# Patient Record
Sex: Female | Born: 1945
Health system: Southern US, Community
[De-identification: ages and names within clinical notes are randomized; demographics above are authoritative.]

## PROBLEM LIST (undated history)

## (undated) DIAGNOSIS — J189 Pneumonia, unspecified organism: Secondary | ICD-10-CM

## (undated) DIAGNOSIS — R269 Unspecified abnormalities of gait and mobility: Secondary | ICD-10-CM

## (undated) DIAGNOSIS — I10 Essential (primary) hypertension: Secondary | ICD-10-CM

## (undated) DIAGNOSIS — Z87898 Personal history of other specified conditions: Secondary | ICD-10-CM

## (undated) DIAGNOSIS — H3530 Unspecified macular degeneration: Secondary | ICD-10-CM

## (undated) DIAGNOSIS — G500 Trigeminal neuralgia: Secondary | ICD-10-CM

## (undated) DIAGNOSIS — M5412 Radiculopathy, cervical region: Secondary | ICD-10-CM

## (undated) DIAGNOSIS — M7918 Myalgia, other site: Secondary | ICD-10-CM

## (undated) DIAGNOSIS — E079 Disorder of thyroid, unspecified: Secondary | ICD-10-CM

## (undated) DIAGNOSIS — G519 Disorder of facial nerve, unspecified: Secondary | ICD-10-CM

## (undated) DIAGNOSIS — H9190 Unspecified hearing loss, unspecified ear: Secondary | ICD-10-CM

## (undated) DIAGNOSIS — M542 Cervicalgia: Secondary | ICD-10-CM

## (undated) DIAGNOSIS — E785 Hyperlipidemia, unspecified: Secondary | ICD-10-CM

## (undated) HISTORY — DX: Personal history of other specified conditions: Z87.898

## (undated) HISTORY — DX: Disorder of thyroid, unspecified: E07.9

## (undated) HISTORY — DX: Pneumonia, unspecified organism: J18.9

## (undated) HISTORY — DX: Hyperlipidemia, unspecified: E78.5

## (undated) HISTORY — PX: ABDOMINAL HYSTERECTOMY: SHX81

## (undated) HISTORY — PX: LUMBAR DISC SURGERY: SHX700

## (undated) HISTORY — DX: Trigeminal neuralgia: G50.0

## (undated) HISTORY — DX: Myalgia, other site: M79.18

## (undated) HISTORY — DX: Unspecified hearing loss, unspecified ear: H91.90

## (undated) HISTORY — DX: Radiculopathy, cervical region: M54.12

## (undated) HISTORY — DX: Unspecified abnormalities of gait and mobility: R26.9

## (undated) HISTORY — DX: Unspecified macular degeneration: H35.30

## (undated) HISTORY — DX: Essential (primary) hypertension: I10

## (undated) HISTORY — DX: Cervicalgia: M54.2

## (undated) HISTORY — PX: OTHER SURGICAL HISTORY: SHX169

## (undated) HISTORY — DX: Disorder of facial nerve, unspecified: G51.9

---
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
## 2001-09-14 ENCOUNTER — Ambulatory Visit (HOSPITAL_COMMUNITY): Admission: RE | Admit: 2001-09-14 | Discharge: 2001-09-14 | Payer: Self-pay | Admitting: Internal Medicine

## 2001-09-14 ENCOUNTER — Encounter: Payer: Self-pay | Admitting: Internal Medicine

## 2001-09-25 ENCOUNTER — Encounter: Payer: Self-pay | Admitting: Internal Medicine

## 2001-09-25 ENCOUNTER — Inpatient Hospital Stay (HOSPITAL_COMMUNITY): Admission: AD | Admit: 2001-09-25 | Discharge: 2001-09-27 | Payer: Self-pay | Admitting: Internal Medicine

## 2001-09-26 ENCOUNTER — Encounter: Payer: Self-pay | Admitting: Cardiology

## 2003-02-16 DIAGNOSIS — Z87898 Personal history of other specified conditions: Secondary | ICD-10-CM

## 2003-02-16 DIAGNOSIS — J189 Pneumonia, unspecified organism: Secondary | ICD-10-CM

## 2003-02-16 HISTORY — DX: Pneumonia, unspecified organism: J18.9

## 2003-02-16 HISTORY — DX: Personal history of other specified conditions: Z87.898

## 2003-02-16 HISTORY — PX: OTHER SURGICAL HISTORY: SHX169

## 2003-04-05 ENCOUNTER — Encounter: Admission: RE | Admit: 2003-04-05 | Discharge: 2003-04-05 | Payer: Self-pay | Admitting: Internal Medicine

## 2003-08-09 ENCOUNTER — Inpatient Hospital Stay (HOSPITAL_COMMUNITY)
Admission: RE | Admit: 2003-08-09 | Discharge: 2003-08-14 | Payer: Self-pay | Admitting: Physical Medicine & Rehabilitation

## 2003-08-14 ENCOUNTER — Encounter: Payer: Self-pay | Admitting: Cardiology

## 2003-08-14 ENCOUNTER — Inpatient Hospital Stay (HOSPITAL_COMMUNITY): Admission: EM | Admit: 2003-08-14 | Discharge: 2003-08-19 | Payer: Self-pay | Admitting: Cardiology

## 2003-08-19 ENCOUNTER — Inpatient Hospital Stay (HOSPITAL_COMMUNITY)
Admission: RE | Admit: 2003-08-19 | Discharge: 2003-09-03 | Payer: Self-pay | Admitting: Physical Medicine & Rehabilitation

## 2003-10-17 ENCOUNTER — Encounter
Admission: RE | Admit: 2003-10-17 | Discharge: 2004-01-15 | Payer: Self-pay | Admitting: Physical Medicine & Rehabilitation

## 2003-10-22 ENCOUNTER — Ambulatory Visit: Payer: Self-pay | Admitting: Physical Medicine & Rehabilitation

## 2004-01-08 ENCOUNTER — Ambulatory Visit: Payer: Self-pay | Admitting: Physical Medicine & Rehabilitation

## 2004-04-06 ENCOUNTER — Ambulatory Visit: Payer: Self-pay | Admitting: Physical Medicine & Rehabilitation

## 2004-04-06 ENCOUNTER — Encounter
Admission: RE | Admit: 2004-04-06 | Discharge: 2004-07-05 | Payer: Self-pay | Admitting: Physical Medicine & Rehabilitation

## 2004-06-03 ENCOUNTER — Ambulatory Visit: Payer: Self-pay | Admitting: Physical Medicine & Rehabilitation

## 2004-07-30 ENCOUNTER — Encounter
Admission: RE | Admit: 2004-07-30 | Discharge: 2004-10-28 | Payer: Self-pay | Admitting: Physical Medicine & Rehabilitation

## 2004-07-30 ENCOUNTER — Ambulatory Visit: Payer: Self-pay | Admitting: Physical Medicine & Rehabilitation

## 2004-09-14 ENCOUNTER — Ambulatory Visit: Payer: Self-pay | Admitting: Physical Medicine & Rehabilitation

## 2004-11-06 ENCOUNTER — Ambulatory Visit: Payer: Self-pay | Admitting: Internal Medicine

## 2004-12-07 ENCOUNTER — Ambulatory Visit: Payer: Self-pay | Admitting: Physical Medicine & Rehabilitation

## 2004-12-07 ENCOUNTER — Encounter
Admission: RE | Admit: 2004-12-07 | Discharge: 2005-03-07 | Payer: Self-pay | Admitting: Physical Medicine & Rehabilitation

## 2005-01-15 ENCOUNTER — Ambulatory Visit: Payer: Self-pay | Admitting: Physical Medicine & Rehabilitation

## 2005-01-25 ENCOUNTER — Encounter
Admission: RE | Admit: 2005-01-25 | Discharge: 2005-02-10 | Payer: Self-pay | Admitting: Physical Medicine & Rehabilitation

## 2005-02-16 ENCOUNTER — Ambulatory Visit: Payer: Self-pay | Admitting: Physical Medicine & Rehabilitation

## 2005-03-17 ENCOUNTER — Encounter
Admission: RE | Admit: 2005-03-17 | Discharge: 2005-06-15 | Payer: Self-pay | Admitting: Physical Medicine & Rehabilitation

## 2005-05-13 ENCOUNTER — Ambulatory Visit: Payer: Self-pay | Admitting: Internal Medicine

## 2005-06-04 ENCOUNTER — Ambulatory Visit: Payer: Self-pay | Admitting: Internal Medicine

## 2005-06-14 ENCOUNTER — Ambulatory Visit: Payer: Self-pay | Admitting: Internal Medicine

## 2005-07-08 ENCOUNTER — Encounter
Admission: RE | Admit: 2005-07-08 | Discharge: 2005-10-06 | Payer: Self-pay | Admitting: Physical Medicine & Rehabilitation

## 2005-07-08 ENCOUNTER — Ambulatory Visit: Payer: Self-pay | Admitting: Physical Medicine & Rehabilitation

## 2005-07-20 ENCOUNTER — Encounter: Admission: RE | Admit: 2005-07-20 | Discharge: 2005-07-20 | Payer: Self-pay | Admitting: Internal Medicine

## 2005-07-26 ENCOUNTER — Encounter: Admission: RE | Admit: 2005-07-26 | Discharge: 2005-07-26 | Payer: Self-pay | Admitting: Internal Medicine

## 2005-09-02 ENCOUNTER — Ambulatory Visit: Payer: Self-pay | Admitting: Physical Medicine & Rehabilitation

## 2005-09-21 ENCOUNTER — Ambulatory Visit: Payer: Self-pay | Admitting: Internal Medicine

## 2005-10-08 ENCOUNTER — Ambulatory Visit: Payer: Self-pay | Admitting: Physical Medicine & Rehabilitation

## 2005-10-08 ENCOUNTER — Encounter
Admission: RE | Admit: 2005-10-08 | Discharge: 2006-01-06 | Payer: Self-pay | Admitting: Physical Medicine & Rehabilitation

## 2005-11-04 ENCOUNTER — Ambulatory Visit: Payer: Self-pay | Admitting: Physical Medicine & Rehabilitation

## 2005-11-09 ENCOUNTER — Ambulatory Visit: Payer: Self-pay | Admitting: Cardiology

## 2005-11-15 ENCOUNTER — Encounter: Payer: Self-pay | Admitting: Internal Medicine

## 2005-11-15 ENCOUNTER — Ambulatory Visit: Payer: Self-pay

## 2005-11-26 ENCOUNTER — Ambulatory Visit: Payer: Self-pay | Admitting: Cardiology

## 2005-12-28 ENCOUNTER — Ambulatory Visit: Payer: Self-pay | Admitting: Physical Medicine & Rehabilitation

## 2006-01-25 ENCOUNTER — Encounter
Admission: RE | Admit: 2006-01-25 | Discharge: 2006-04-25 | Payer: Self-pay | Admitting: Physical Medicine & Rehabilitation

## 2006-02-22 ENCOUNTER — Ambulatory Visit: Payer: Self-pay | Admitting: Physical Medicine & Rehabilitation

## 2006-04-19 ENCOUNTER — Ambulatory Visit: Payer: Self-pay | Admitting: Physical Medicine & Rehabilitation

## 2006-05-19 ENCOUNTER — Encounter
Admission: RE | Admit: 2006-05-19 | Discharge: 2006-08-17 | Payer: Self-pay | Admitting: Physical Medicine & Rehabilitation

## 2006-06-16 ENCOUNTER — Ambulatory Visit: Payer: Self-pay | Admitting: Physical Medicine & Rehabilitation

## 2006-06-23 DIAGNOSIS — D7289 Other specified disorders of white blood cells: Secondary | ICD-10-CM

## 2006-06-23 DIAGNOSIS — R946 Abnormal results of thyroid function studies: Secondary | ICD-10-CM

## 2006-06-23 DIAGNOSIS — Z9079 Acquired absence of other genital organ(s): Secondary | ICD-10-CM | POA: Insufficient documentation

## 2006-06-23 DIAGNOSIS — I10 Essential (primary) hypertension: Secondary | ICD-10-CM | POA: Insufficient documentation

## 2006-07-20 ENCOUNTER — Encounter
Admission: RE | Admit: 2006-07-20 | Discharge: 2006-10-18 | Payer: Self-pay | Admitting: Physical Medicine & Rehabilitation

## 2006-08-13 ENCOUNTER — Encounter: Admission: RE | Admit: 2006-08-13 | Discharge: 2006-08-13 | Payer: Self-pay | Admitting: Neurosurgery

## 2006-08-16 ENCOUNTER — Ambulatory Visit: Payer: Self-pay | Admitting: Physical Medicine & Rehabilitation

## 2006-09-05 ENCOUNTER — Encounter
Admission: RE | Admit: 2006-09-05 | Discharge: 2006-12-04 | Payer: Self-pay | Admitting: Physical Medicine & Rehabilitation

## 2006-09-06 ENCOUNTER — Ambulatory Visit: Payer: Self-pay | Admitting: Physical Medicine & Rehabilitation

## 2006-10-06 ENCOUNTER — Encounter
Admission: RE | Admit: 2006-10-06 | Discharge: 2007-01-04 | Payer: Self-pay | Admitting: Physical Medicine & Rehabilitation

## 2006-10-18 ENCOUNTER — Ambulatory Visit: Payer: Self-pay | Admitting: Physical Medicine & Rehabilitation

## 2006-10-18 ENCOUNTER — Encounter
Admission: RE | Admit: 2006-10-18 | Discharge: 2006-10-18 | Payer: Self-pay | Admitting: Physical Medicine & Rehabilitation

## 2006-10-21 ENCOUNTER — Ambulatory Visit: Payer: Self-pay | Admitting: Physical Medicine & Rehabilitation

## 2007-01-16 ENCOUNTER — Encounter
Admission: RE | Admit: 2007-01-16 | Discharge: 2007-01-17 | Payer: Self-pay | Admitting: Physical Medicine & Rehabilitation

## 2007-01-16 ENCOUNTER — Ambulatory Visit: Payer: Self-pay | Admitting: Physical Medicine & Rehabilitation

## 2007-02-14 ENCOUNTER — Encounter
Admission: RE | Admit: 2007-02-14 | Discharge: 2007-04-27 | Payer: Self-pay | Admitting: Physical Medicine & Rehabilitation

## 2007-05-10 ENCOUNTER — Ambulatory Visit: Payer: Self-pay | Admitting: Physical Medicine & Rehabilitation

## 2007-05-10 ENCOUNTER — Encounter
Admission: RE | Admit: 2007-05-10 | Discharge: 2007-05-12 | Payer: Self-pay | Admitting: Physical Medicine & Rehabilitation

## 2007-08-03 ENCOUNTER — Encounter
Admission: RE | Admit: 2007-08-03 | Discharge: 2007-11-01 | Payer: Self-pay | Admitting: Physical Medicine & Rehabilitation

## 2007-08-07 ENCOUNTER — Ambulatory Visit: Payer: Self-pay | Admitting: Physical Medicine & Rehabilitation

## 2007-08-26 ENCOUNTER — Encounter: Admission: RE | Admit: 2007-08-26 | Discharge: 2007-08-26 | Payer: Self-pay | Admitting: Neurosurgery

## 2007-10-03 ENCOUNTER — Ambulatory Visit: Payer: Self-pay | Admitting: Physical Medicine & Rehabilitation

## 2007-11-27 ENCOUNTER — Encounter
Admission: RE | Admit: 2007-11-27 | Discharge: 2008-01-23 | Payer: Self-pay | Admitting: Physical Medicine & Rehabilitation

## 2007-11-28 ENCOUNTER — Ambulatory Visit: Payer: Self-pay | Admitting: Physical Medicine & Rehabilitation

## 2008-01-23 ENCOUNTER — Ambulatory Visit: Payer: Self-pay | Admitting: Physical Medicine & Rehabilitation

## 2008-04-18 ENCOUNTER — Encounter
Admission: RE | Admit: 2008-04-18 | Discharge: 2008-07-17 | Payer: Self-pay | Admitting: Physical Medicine & Rehabilitation

## 2008-04-19 ENCOUNTER — Ambulatory Visit: Payer: Self-pay | Admitting: Physical Medicine & Rehabilitation

## 2008-05-31 ENCOUNTER — Ambulatory Visit: Payer: Self-pay | Admitting: Physical Medicine & Rehabilitation

## 2008-06-28 ENCOUNTER — Ambulatory Visit: Payer: Self-pay | Admitting: Physical Medicine & Rehabilitation

## 2008-07-23 ENCOUNTER — Encounter
Admission: RE | Admit: 2008-07-23 | Discharge: 2008-10-17 | Payer: Self-pay | Admitting: Physical Medicine & Rehabilitation

## 2008-07-25 ENCOUNTER — Ambulatory Visit: Payer: Self-pay | Admitting: Physical Medicine & Rehabilitation

## 2008-08-27 ENCOUNTER — Ambulatory Visit: Payer: Self-pay | Admitting: Physical Medicine & Rehabilitation

## 2008-09-27 ENCOUNTER — Ambulatory Visit: Payer: Self-pay | Admitting: Physical Medicine & Rehabilitation

## 2008-10-25 ENCOUNTER — Ambulatory Visit: Payer: Self-pay | Admitting: Physical Medicine & Rehabilitation

## 2008-10-25 ENCOUNTER — Encounter
Admission: RE | Admit: 2008-10-25 | Discharge: 2009-01-03 | Payer: Self-pay | Admitting: Physical Medicine & Rehabilitation

## 2008-11-20 ENCOUNTER — Ambulatory Visit: Payer: Self-pay | Admitting: Physical Medicine & Rehabilitation

## 2008-12-18 ENCOUNTER — Ambulatory Visit: Payer: Self-pay | Admitting: Physical Medicine & Rehabilitation

## 2008-12-25 ENCOUNTER — Ambulatory Visit: Payer: Self-pay | Admitting: Internal Medicine

## 2008-12-25 DIAGNOSIS — R5381 Other malaise: Secondary | ICD-10-CM

## 2008-12-25 DIAGNOSIS — R5383 Other fatigue: Secondary | ICD-10-CM

## 2008-12-25 DIAGNOSIS — R091 Pleurisy: Secondary | ICD-10-CM | POA: Insufficient documentation

## 2008-12-26 ENCOUNTER — Encounter (INDEPENDENT_AMBULATORY_CARE_PROVIDER_SITE_OTHER): Payer: Self-pay | Admitting: *Deleted

## 2009-01-03 ENCOUNTER — Telehealth (INDEPENDENT_AMBULATORY_CARE_PROVIDER_SITE_OTHER): Payer: Self-pay | Admitting: *Deleted

## 2009-01-03 ENCOUNTER — Encounter
Admission: RE | Admit: 2009-01-03 | Discharge: 2009-02-12 | Payer: Self-pay | Admitting: Physical Medicine & Rehabilitation

## 2009-01-06 ENCOUNTER — Telehealth (INDEPENDENT_AMBULATORY_CARE_PROVIDER_SITE_OTHER): Payer: Self-pay | Admitting: *Deleted

## 2009-01-06 LAB — CONVERTED CEMR LAB
ALT: 16 units/L (ref 0–35)
Basophils Relative: 0 % (ref 0.0–3.0)
Creatinine, Ser: 1 mg/dL (ref 0.4–1.2)
Eosinophils Relative: 2.6 % (ref 0.0–5.0)
Free T4: 0.7 ng/dL (ref 0.6–1.6)
Lymphocytes Relative: 28.2 % (ref 12.0–46.0)
Neutrophils Relative %: 58.8 % (ref 43.0–77.0)
Potassium: 5.6 meq/L — ABNORMAL HIGH (ref 3.5–5.1)
RBC: 4.26 M/uL (ref 3.87–5.11)
WBC: 8.6 10*3/uL (ref 4.5–10.5)

## 2009-01-15 ENCOUNTER — Ambulatory Visit: Payer: Self-pay | Admitting: Physical Medicine & Rehabilitation

## 2009-02-12 ENCOUNTER — Ambulatory Visit: Payer: Self-pay | Admitting: Physical Medicine & Rehabilitation

## 2009-02-17 ENCOUNTER — Ambulatory Visit: Payer: Self-pay | Admitting: Internal Medicine

## 2009-02-24 ENCOUNTER — Encounter (INDEPENDENT_AMBULATORY_CARE_PROVIDER_SITE_OTHER): Payer: Self-pay | Admitting: *Deleted

## 2009-02-28 ENCOUNTER — Telehealth (INDEPENDENT_AMBULATORY_CARE_PROVIDER_SITE_OTHER): Payer: Self-pay | Admitting: *Deleted

## 2009-03-13 ENCOUNTER — Encounter
Admission: RE | Admit: 2009-03-13 | Discharge: 2009-05-22 | Payer: Self-pay | Admitting: Physical Medicine & Rehabilitation

## 2009-03-20 ENCOUNTER — Ambulatory Visit: Payer: Self-pay | Admitting: Physical Medicine & Rehabilitation

## 2009-05-22 ENCOUNTER — Ambulatory Visit: Payer: Self-pay | Admitting: Physical Medicine & Rehabilitation

## 2009-06-26 ENCOUNTER — Encounter
Admission: RE | Admit: 2009-06-26 | Discharge: 2009-06-30 | Payer: Self-pay | Admitting: Physical Medicine & Rehabilitation

## 2009-06-30 ENCOUNTER — Ambulatory Visit: Payer: Self-pay | Admitting: Physical Medicine & Rehabilitation

## 2009-07-30 ENCOUNTER — Encounter: Admission: RE | Admit: 2009-07-30 | Discharge: 2009-07-30 | Payer: Self-pay | Admitting: Neurosurgery

## 2009-08-21 ENCOUNTER — Ambulatory Visit: Payer: Self-pay | Admitting: Physical Medicine & Rehabilitation

## 2009-08-21 ENCOUNTER — Encounter
Admission: RE | Admit: 2009-08-21 | Discharge: 2009-11-19 | Payer: Self-pay | Admitting: Physical Medicine & Rehabilitation

## 2009-10-01 ENCOUNTER — Ambulatory Visit: Payer: Self-pay | Admitting: Physical Medicine & Rehabilitation

## 2009-11-13 ENCOUNTER — Ambulatory Visit: Payer: Self-pay | Admitting: Physical Medicine & Rehabilitation

## 2009-12-05 ENCOUNTER — Encounter
Admission: RE | Admit: 2009-12-05 | Discharge: 2010-02-12 | Payer: Self-pay | Source: Home / Self Care | Attending: Physical Medicine & Rehabilitation | Admitting: Physical Medicine & Rehabilitation

## 2009-12-11 ENCOUNTER — Ambulatory Visit: Payer: Self-pay | Admitting: Physical Medicine & Rehabilitation

## 2010-01-12 ENCOUNTER — Ambulatory Visit: Payer: Self-pay | Admitting: Physical Medicine & Rehabilitation

## 2010-02-17 ENCOUNTER — Encounter
Admission: RE | Admit: 2010-02-17 | Discharge: 2010-02-20 | Payer: Self-pay | Source: Home / Self Care | Attending: Physical Medicine & Rehabilitation | Admitting: Physical Medicine & Rehabilitation

## 2010-02-20 ENCOUNTER — Ambulatory Visit
Admission: RE | Admit: 2010-02-20 | Discharge: 2010-02-20 | Payer: Self-pay | Source: Home / Self Care | Attending: Physical Medicine & Rehabilitation | Admitting: Physical Medicine & Rehabilitation

## 2010-03-19 ENCOUNTER — Ambulatory Visit: Payer: Medicare Other

## 2010-03-19 ENCOUNTER — Ambulatory Visit: Admit: 2010-03-19 | Payer: Self-pay | Admitting: Physical Medicine & Rehabilitation

## 2010-03-19 ENCOUNTER — Ambulatory Visit: Payer: Medicare Other | Attending: Physical Medicine & Rehabilitation

## 2010-03-19 DIAGNOSIS — R269 Unspecified abnormalities of gait and mobility: Secondary | ICD-10-CM

## 2010-03-19 DIAGNOSIS — G510 Bell's palsy: Secondary | ICD-10-CM

## 2010-03-19 DIAGNOSIS — G500 Trigeminal neuralgia: Secondary | ICD-10-CM

## 2010-03-19 DIAGNOSIS — Z79899 Other long term (current) drug therapy: Secondary | ICD-10-CM | POA: Insufficient documentation

## 2010-03-19 NOTE — Progress Notes (Signed)
Summary: Lab Results  Phone Note Call from Patient Call back at Home Phone [phone]   Caller: Patient Summary of Call: Message left on VM: Please call about labs  Free T3 & Free T4 are normal;TSH has improved but still  is minimally elevated. Please recheck same labs in 3 months. TSH goal = 1-3.(794.5) [name]  February 28, 2009 12:13 PM

## 2010-03-19 NOTE — Letter (Signed)
Summary: Results Follow up Letter  [facility]  [address] [place], [place] [address]   Phone: [phone]  Fax: [phone]    02/24/2009 MRN: [id number]   [name] [address] [place], [place]  [address]    Dear Ms. Holladay,  The following are the results of your recent test(s):  Test         Result    Pap Smear:        Normal _____  Not Normal _____ Comments: ______________________________________________________ Cholesterol: LDL(Bad cholesterol):         Your goal is less than:         HDL (Good cholesterol):       Your goal is more than: Comments:  ______________________________________________________ Mammogram:        Normal _____  Not Normal _____ Comments:  ___________________________________________________________________ Hemoccult:        Normal _____  Not normal _______ Comments:    _____________________________________________________________________ Other Tests:Please see attached labs.    We routinely do not discuss normal results over the telephone.  If you desire a copy of the results, or you have any questions about this information we can discuss them at your next office visit.   Sincerely,    Felecia,CMA

## 2010-04-16 ENCOUNTER — Encounter: Payer: Medicare Other | Attending: Physical Medicine & Rehabilitation

## 2010-04-16 ENCOUNTER — Ambulatory Visit: Payer: Medicare Other

## 2010-04-16 DIAGNOSIS — T889XXS Complication of surgical and medical care, unspecified, sequela: Secondary | ICD-10-CM | POA: Insufficient documentation

## 2010-04-16 DIAGNOSIS — R269 Unspecified abnormalities of gait and mobility: Secondary | ICD-10-CM

## 2010-04-16 DIAGNOSIS — Y849 Medical procedure, unspecified as the cause of abnormal reaction of the patient, or of later complication, without mention of misadventure at the time of the procedure: Secondary | ICD-10-CM | POA: Insufficient documentation

## 2010-04-16 DIAGNOSIS — S049XXS Injury of unspecified cranial nerve, sequela: Secondary | ICD-10-CM | POA: Insufficient documentation

## 2010-04-16 DIAGNOSIS — R51 Headache: Secondary | ICD-10-CM | POA: Insufficient documentation

## 2010-04-16 DIAGNOSIS — G500 Trigeminal neuralgia: Secondary | ICD-10-CM | POA: Insufficient documentation

## 2010-04-16 DIAGNOSIS — G510 Bell's palsy: Secondary | ICD-10-CM

## 2010-04-27 ENCOUNTER — Ambulatory Visit (INDEPENDENT_AMBULATORY_CARE_PROVIDER_SITE_OTHER): Payer: Self-pay | Admitting: Internal Medicine

## 2010-04-27 ENCOUNTER — Encounter: Payer: Self-pay | Admitting: Internal Medicine

## 2010-04-27 DIAGNOSIS — S139XXA Sprain of joints and ligaments of unspecified parts of neck, initial encounter: Secondary | ICD-10-CM | POA: Insufficient documentation

## 2010-04-27 DIAGNOSIS — R519 Headache, unspecified: Secondary | ICD-10-CM | POA: Insufficient documentation

## 2010-04-27 DIAGNOSIS — D332 Benign neoplasm of brain, unspecified: Secondary | ICD-10-CM

## 2010-04-27 DIAGNOSIS — R51 Headache: Secondary | ICD-10-CM | POA: Insufficient documentation
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
## 2010-05-20 ENCOUNTER — Encounter: Payer: Medicare Other | Attending: Physical Medicine & Rehabilitation | Admitting: Physical Medicine & Rehabilitation

## 2010-05-20 DIAGNOSIS — G500 Trigeminal neuralgia: Secondary | ICD-10-CM

## 2010-05-20 DIAGNOSIS — IMO0001 Reserved for inherently not codable concepts without codable children: Secondary | ICD-10-CM

## 2010-05-20 DIAGNOSIS — R269 Unspecified abnormalities of gait and mobility: Secondary | ICD-10-CM | POA: Insufficient documentation

## 2010-05-20 DIAGNOSIS — G510 Bell's palsy: Secondary | ICD-10-CM
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
## 2010-06-17 ENCOUNTER — Ambulatory Visit: Payer: Medicare Other | Admitting: Physical Medicine & Rehabilitation

## 2010-06-17 ENCOUNTER — Encounter: Payer: Medicare Other | Attending: Physical Medicine & Rehabilitation | Admitting: Physical Medicine & Rehabilitation

## 2010-06-17 DIAGNOSIS — R42 Dizziness and giddiness: Secondary | ICD-10-CM | POA: Insufficient documentation

## 2010-06-17 DIAGNOSIS — G8929 Other chronic pain: Secondary | ICD-10-CM | POA: Insufficient documentation

## 2010-06-17 DIAGNOSIS — T889XXS Complication of surgical and medical care, unspecified, sequela: Secondary | ICD-10-CM | POA: Insufficient documentation

## 2010-06-17 DIAGNOSIS — Y838 Other surgical procedures as the cause of abnormal reaction of the patient, or of later complication, without mention of misadventure at the time of the procedure: Secondary | ICD-10-CM | POA: Insufficient documentation

## 2010-06-17 DIAGNOSIS — M542 Cervicalgia: Secondary | ICD-10-CM | POA: Insufficient documentation

## 2010-06-17 DIAGNOSIS — R269 Unspecified abnormalities of gait and mobility: Secondary | ICD-10-CM

## 2010-06-17 DIAGNOSIS — G500 Trigeminal neuralgia: Secondary | ICD-10-CM | POA: Insufficient documentation

## 2010-06-17 DIAGNOSIS — IMO0001 Reserved for inherently not codable concepts without codable children: Secondary | ICD-10-CM

## 2010-06-17 DIAGNOSIS — M25519 Pain in unspecified shoulder: Secondary | ICD-10-CM | POA: Insufficient documentation

## 2010-06-17 DIAGNOSIS — R5381 Other malaise: Secondary | ICD-10-CM | POA: Insufficient documentation

## 2010-06-17 DIAGNOSIS — S049XXS Injury of unspecified cranial nerve, sequela: Secondary | ICD-10-CM | POA: Insufficient documentation
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
## 2010-06-30 NOTE — Procedures (Signed)
[name], [name]             ACCOUNT NO.:  [id number]   MEDICAL RECORD NO.:  [id number]          PATIENT TYPE:  REC   LOCATION:  [facility]                          FACILITY:  [facility]   PHYSICIAN:  Erick Colace, M.D.DATE OF BIRTH:  [birth year]   DATE OF PROCEDURE:  10/28/2006  DATE OF DISCHARGE:                               OPERATIVE REPORT   The patient has a history of trigeminal neuralgia.  She has had  acupuncture treatment x4 and the last treatment resulted in one week  reduction of pain which the patient feels is substantial.  The pain has  gone down from 7 out of 10 to 5 out of 10.   Treatment today consisted of placement of 15 mm acupuncture needles at  SI19, ST4, RN24, DU26, stim with between SI19 and between ST4 and RN24  at 150 hertz and at [place] and SI19 at 4 hertz.  The patient tolerated the  procedure well.      Erick Colace, M.D.  Electronically Signed     AEK/MEDQ  D:  10/28/2006 16:33:53  T:  10/28/2006 23:19:48  Job:  [id number]

## 2010-06-30 NOTE — Assessment & Plan Note (Signed)
[name] is back regarding her facial pain.  She feels that her speech is  worsened over the last few months, and she has had more sensory loss.  She has followed up with her ophthalmologist, who does not want to take  her gold weight out of her eyelid, but it seems to be causing allergic  response, where she has had some infections as a result.  The patient  denies any other neurological changes.  She rates her pain a 5/10.  The  methadone seems to help most days, but she still has mid day relapse  where her pain seems to increase around 1 or 2 o'clock p.m.  She sleeps  fairly well.  Pain is described as stabbing, burning, constant, and  tingling.  She liked the Voltaren gel.  We gave her samples.   REVIEW OF SYSTEMS:  Notable for numbness, trouble walking, dizziness,  and limb swelling.  The patient denies any other issues other than those  mentioned above.  Full review is in the written health and history  section.   Oswestry score is 42% today.   SOCIAL HISTORY:  The patient is married.  Husband remains supportive.   PHYSICAL EXAMINATION:  VITAL SIGNS:  Blood pressure is 152/86, pulse is  81, respiratory rate 18, and she is sating 95% on room air.  She continues to have stable lid closure.  She does seem to have a bit  more of a problem with speech and has this more dysarthria today.  It is  hard to discriminate whether she has further sensory loss.  She may be a  bit drier in the oral mucosa as well.  Gait is stable in general, but  still has some problems with more higher level balance.  Cognition is  intact.  HEART:  Regular.  CHEST:  Clear.  ABDOMEN:  Soft, nontender.   ASSESSMENT:  1. Left vestibular schwannoma with perioperative facial nerve injury.  2. Left trigeminal neuralgia.   PLAN:  1. We will increase methadone to 10 mg t.i.d.  2. Begin Savella trial, titrating up to 50 mg b.i.d. over 1 month.  3. The patient was given prescription for Voltaren gel.  4. I will  see her back in about 6 weeks' time.  Still consider      Nucynta.       Ranelle Oyster, M.D.  Electronically Signed     ZTS/MedQ  D:  04/19/2008 14:13:28  T:  04/20/2008 05:51:10  Job #:  [id number]   cc:   Titus Dubin. Alwyn Ren, MD,FACP,FCCP  [phone] [place]  [place] [address]

## 2010-06-30 NOTE — Assessment & Plan Note (Signed)
[name] is back regarding her facial pain.  She did not start the  [facility], as after she left our office, she had GI infection and she was  afraid to start without talking to me again.  We did bump up her  methadone a bit, which she uses on occasion and it seems to help.  She  does complain of some generalized fatigue.  Her pain ranges from 6-8/10.  Otherwise, nothing really has changed.  Pain is described as sharp,  burning, stabbing, constant, tingling, and aching.  Pain interferes with  general activity, relations with others, enjoyment of life on a moderate-  to-severe level.  Sleep is good.   REVIEW OF SYSTEMS:  Notable for trouble walking, tingling, dizziness,  anxiety, constipation, limb swelling.  Other pertinent positives are  above.  Full review is in the written health and history section of the  chart.   SOCIAL HISTORY:  The patient is married, lives with her husband.   PHYSICAL EXAMINATION:  VITAL SIGNS:  Blood pressure is 131/67, pulse is  65, respiratory rate 18.  She is sating 99% on room air.  GENERAL:  The patient is pleasant, alert and oriented x3.  NEUROLOGIC:  She has familiar deficits in the left facial nerve  distribution.  Face is somewhat decreased still to fine touch and  sensitive to touch in general.  She still has a bit of balance deficit.  Cognitively, she is well within normal limits.  HEART:  Regular.  CHEST:  Clear.  ABDOMEN:  Soft, nontender.   ASSESSMENT:  1. Left vestibular Schwannoma with perioperative facial nerve injury.  2. Left trigeminal neuralgia.   PLAN:  1. Continue with methadone 10-15 mg b.i.d.  2. We will retry Savella as previously described.  She is to call with      any problems.  3. Again, we discussed Nucynta, which we can look at as well.  She      will call if she is interested in this depending on how she does      with the [facility].  4. I will see her back in 3 months with Nurse Clinic followup in 1      month's  time.      Ranelle Oyster, M.D.  Electronically Signed     ZTS/MedQ  D:  05/31/2008 12:11:10  T:  06/01/2008 03:16:14  Job #:  [id number]   cc:   Titus Dubin. Alwyn Ren, MD,FACP,FCCP  [phone] [place]  [place] [address]

## 2010-06-30 NOTE — Assessment & Plan Note (Signed)
HISTORY:  [name] is back regarding her facial pain.  She reports 4-5/10  pain.  She is generally happy with the methadone 10 mg b.i.d.  She often  only takes a half in the morning.  She describes her pain as sharp,  burning, constant, and tingling.  Her biggest concern is increasing  facial numbness over the left face over the last month or so.  She has  followup MRI with Dr. Wynetta Emery next month.  She also states that she has had  a more difficult time opening the left eye lid.  She wonders the way to  come out of the eye.  She then has never had her new pain cream still  that she lost the prescription.  She uses hydrocodone for breakthrough  pain.   REVIEW OF SYSTEMS:  Notable for the above as well as trouble walking and  with dizziness still.  She has occasional depression.  She has loss of  taste, occasional limb swelling, and weakness.  Full review is in the  written health and history section in the chart.   SOCIAL HISTORY:  The patient lives with her husband and no new findings  are noted today.   PHYSICAL EXAMINATION:  VITAL SIGNS:  Blood pressure is 117/53, pulse 65,  respiratory rate 16, and she is sating 99% on room air.  GENERAL:  The patient is pleasant, alert, and oriented x3.  She has no  obvious differences in her left facial asymmetries today.  She had  intact lid closure.  She continues to have decreased discrimination of  light touch in the left side, but remained hypersensitive in the third  division of cranial nerve V area.  Speech is intelligible.  Balance is  fair with normal gait, but she struggles with heel and toe walking.  Cognition is intact.  HEART:  Regular.  CHEST:  Clear.  ABDOMEN:  Soft and nontender.   ASSESSMENT:  1. Left-sided vestibular schwannoma with perioperative facial nerve      injury.  2. Left-sided trigeminal neuralgia.   PLAN:  1. Continue methadone at 5-10 mg b.i.d.  2. We wrote analgesic cream of 2% ketamine, 20% ketoprofen, and 4%    Elavil to be used b.i.d. to t.i.d.  3. Continue Vicodin and Tofranil.  4. I will see her back in about 4 months with 2 months followup in the      nurse clinic.  The patient will see Dr. Wynetta Emery next month to follow      up her MRI and discuss her facial numbness.      Ranelle Oyster, M.D.  Electronically Signed     ZTS/MedQ  D:  08/07/2007 11:28:52  T:  08/08/2007 01:35:54  Job #:  [id number]   cc:   Titus Dubin. Alwyn Ren, MD,FACP,FCCP  [phone] [place]  [place] [address]   Donalee Citrin, M.D.  Fax: [phone]   Jesse Sans. Wall, MD, FACC  [address]  [address]  [place]  [place] [address]

## 2010-06-30 NOTE — Group Therapy Note (Signed)
Consult requested by Dr. Ivory Broad, in order to evaluate acupuncture  as a potential modality for treatment of her trigeminal neuralgia.  The  patient is a 65 year old female with history of left vestibular  schwannoma, resected at [facility] August 09, 2003.  She also had a cranial  nerve 7 palsy and resultant trigeminal neuralgia.  She was inpatient at  [facility] and her inpatient at [facility] on two occasions, one August 09, 2003 to June 29, but was discharged with cardiac arrhythmias with SVT  but was readmitted July 7 to September 03, 2003 to complete her  rehabilitation stay.  She has been managed with hydrocodone, Duragesic  patch.  She has gone on social security disability for problems related  to her schwannoma.  She has been tried on Neurontin, Lyrica, Topamax,  Gabitril and maintained on Cymbalta, as well as Ultram and hydrocodone.  In addition, she was tried on Namenda in combination of Cymbalta and  Lyrica.  Trigeminal nerve block was recommended.  Her ophthalmologist  recommended against it, stating it may interfere with her vision.  She  has also tried topical cream with compounded Neurontin and baclofen.  Her pain is mainly in the maxillary region as well as mandibular region  from midline to the angle of the mandible and zygoma.  She denies any  neck pain or upper extremity pain.   CURRENT MEDICATIONS:  Include Vicodin, Demerol, and Tofranil as well as  Duragesic patch.   She is awaiting further follow up with Dr. Donalee Citrin.   PHYSICAL EXAMINATION:  CRANIAL NERVES:  Cranial nerves 2,3,4, 6 intact,  5 has decreased sensation in V2, V3 and some paresthesias in V1.  Cranial nerve 7 is out, i.e. 0, on the left side, and intact on the  right side.  NECK:  He neck range of motion is full.  Neck palpation is nontender.  EXTREMITIES:  Upper extremity strength is full.  Gait is normal.   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   PLAN:  The patient would be a good candidate for acupuncture.  Will do a  trial treatment today which consists of STU2, DL2, DU26 and SI19, 20  minutes auricular point thalamus placed as well.  If the patient  tolerates the procedure well, will schedule some additional treatments.      Erick Colace, M.D.  Electronically Signed     AEK/MedQ  D:  09/06/2006 16:39:19  T:  09/07/2006 09:53:48  Job #:  [id number]   cc:   Donalee Citrin, M.D.  Fax: [phone]

## 2010-06-30 NOTE — Procedures (Signed)
[name], [name]             ACCOUNT NO.:  [id number]   MEDICAL RECORD NO.:  [id number]          PATIENT TYPE:  REC   LOCATION:  [facility]                          FACILITY:  [facility]   PHYSICIAN:  Erick Colace, M.D.DATE OF BIRTH:  23-Jul-1945   DATE OF PROCEDURE:  10/21/2006  DATE OF DISCHARGE:                               OPERATIVE REPORT   ACUPUNCTURE TREATMENT:  Treatment today consists of SI9 to RN24, ST4,  DU26, e-stem with RN24 and ST4 at 2 Hz and with DU26 at 150 Hz.  In  addition, .ST2 utilized as well.  The patient tolerated the procedure  well.   TREATMENT TIME:  30 minutes.   Return in 2 weeks.      Erick Colace, M.D.  Electronically Signed     AEK/MEDQ  D:  10/21/2006 13:20:32  T:  10/21/2006 15:11:28  Job:  [id number]

## 2010-06-30 NOTE — Assessment & Plan Note (Signed)
The patient is back regarding her facial pain.  She has received  Acupuncture per Dr. Fritzi Mandes and is still having pain in the left face.  She does not know that it has helped thus far.  Her pain averages 7 out  of 10.  She describes it as sharp, burning, stabbing, tingling.  Sleep  is fair.  She is concerned that her face may be drooping more on the  left over the last few weeks than it had previously.  Dr. Wynetta Emery  apparently found a 9 mm area in the operative site that was concerning  for a potential new tumor versus scar tissue.  The plan is to check  serial scans.   REVIEW OF SYSTEMS:  Notable for numbness, tingling, trouble walking,  dizziness still when up.  She complains of loss of taste.  Mood has been  fair.   SOCIAL HISTORY:  Without change.   PHYSICAL EXAMINATION:  Blood pressure 120/63, pulse 64, respiratory rate  18, she is satting 98% on room air.  The patient's gait is stable  although occasionally ataxic and still wide based.  The patient remained  sensitive in the left third division of the trigeminal nerve.  Heart is  regular, chest is clear.  Abdomen soft, nontender.   ASSESSMENT:  1. 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  2. Left sided trigeminal neuralgia.   PLAN:  1. Begin Duragesic patches for now.  Consider other options including      oxycodone, opana, or methadone.  2. Await further results with Acupuncture.  3. Continue Vicodin for breakthrough pain with Demerol for more severe      pain.  4. Tofranil PM for neuropathic pain.  5. Continue analgesic cream as well.  6. I will see her back in one to two month's time.  We discussed other      homeopathic alternatives, treatments that she can explore as well.      Ranelle Oyster, M.D.  Electronically Signed     ZTS/MedQ  D:  10/18/2006 12:51:37  T:  10/18/2006 14:01:36  Job #:  [id number]

## 2010-06-30 NOTE — Assessment & Plan Note (Signed)
[name] is back regarding her facial pain.  Pain is a little bit worse  today, although it has been generally in the realm of 4-6/10.  She is  using methadone 10 mg b.i.d.  She asks if on occasion, she can take  more.  She saw Dr. Wynetta Emery, who found no significant change in any of the  perioperative sites.  We recommended 2-year followup with him.  She has  not filled the analgesic cream as of yet.  She uses Vicodin for  breakthrough pain.  She is no longer using Tofranil.  The patient is  following up with surgeon at [facility] regarding the lid weight as it  causes some irritation and she would like to have it taken out.   REVIEW OF SYSTEMS:  Notable for numbness, occasional dizziness, anxiety,  urinary retention, constipation, and limb swelling.  Other pertinent  positives are above and full review is in the written health and history  section.   SOCIAL HISTORY:  The patient is married and husband is with her today,  whom I met for the first time.   PHYSICAL EXAMINATION:  VITAL SIGNS:  Blood pressure is 148/58, pulse is  60, respiratory rate 18, and she is sating 99% on room air.  GENERAL:  The patient is pleasant, alert, and oriented x3.  I saw no  focal deficits in her cranial nerve exam today.  She still has lid  closure with effort.  There is decreased light touch discrimination with  hypersensitivity in the third division of five.  Speech is intelligible.  She continues to have decreased gait and is unable to walk with heal-to-  toe due to loss balance, particularly on the right leg.  Cognition is  normal.  HEART:  Regular.  CHEST:  Clear.  ABDOMEN:  Soft and nontender.   ASSESSMENT:  1. Left-sided vestibular schwannoma with perioperative fascial nerve      injury.  2. Left trigeminal neuralgia.   PLAN:  1. Continue methadone 10 mg b.i.d.  2. Encouraged her to have her analgesic cream filled with 2% ketamine,      20% ketoprofen, and 4% Elavil to be used b.i.d. to t.i.d.  3.  Continue Vicodin for breakthrough pain.  4. Discussed the options including Nucynta and Savella for pain.  5. We will see her back in 2 months with nursing clinic and 4 months      with me.      [name], M.D.  Electronically Signed     ZTS/MedQ  D:  11/28/2007 11:01:53  T:  11/29/2007 00:44:09  Job #:  [id number]   cc:   [name]. Traci Ren, MD,FACP,FCCP  [phone] [place]  [place] [address]   [name], M.D.  Fax: [phone]   Traci Sans. Wall, MD, FACC  [address]  [address]  [place]  [place] [address]

## 2010-06-30 NOTE — Procedures (Signed)
NAME:  [name], [name]             ACCOUNT NO.:  10/07/2006   MEDICAL RECORD NO.:  [id number]           PATIENT TYPE:   LOCATION:                                 FACILITY:   PHYSICIAN:  Erick Colace, M.D.DATE OF BIRTH:  September 13, 1945   DATE OF PROCEDURE:  DATE OF DISCHARGE:                               OPERATIVE REPORT   Treatment today is acupuncture treatment with needles placed at BL2,  ST2, on the left at STL2, ST2, SI as equally 19, ST4, and RN24.  E stim  between SI 19 and ST4, 4 hertz times 30 minutes.  Auricular adhesive  tack placed at left shen-men.  She tolerated procedure well.  Return in  one week.      Erick Colace, M.D.  Electronically Signed     AEK/MEDQ  D:  10/07/2006 13:36:14  T:  10/08/2006 16:40:26  Job:  [id number]

## 2010-06-30 NOTE — Procedures (Signed)
NAMEMARKAYLA, [name]             ACCOUNT NO.:  [id number]   MEDICAL RECORD NO.:  [id number]          PATIENT TYPE:  REC   LOCATION:  [facility]                          FACILITY:  [facility]   PHYSICIAN:  Erick Colace, M.D.DATE OF BIRTH:  09/01/1945   DATE OF PROCEDURE:  11/03/2006  DATE OF DISCHARGE:                               OPERATIVE REPORT   This is for trigeminal neuralgia, Acupuncture treatments times five thus  far, reduction in pain since I initiated treatment.   Treatment today consisted of 15 mm Acupuncture needles placed at SI 19,  ST 4, RN 24, DU 26 with stem between ST 4 and RN 24, 150 Hz, the 26 SI  19 at 4 Hz, ST 4, SI 9 at 4 Hz, and also DU 20, DU 24.5 at 4 Hz.   The patient tolerated the procedure well.  We will see her back in  approximately two weeks.      Erick Colace, M.D.  Electronically Signed     AEK/MEDQ  D:  11/03/2006 10:57:40  T:  11/03/2006 12:31:12  Job:  [id number]   cc:   Erick Colace, M.D.  Fax: [phone]

## 2010-06-30 NOTE — Assessment & Plan Note (Signed)
[name] is back regarding her facial pain.  She had some initial positive  results with the acupuncture but failed to see any further improvement  after a series of 5 or 6 treatments.  She has been out of her Duragesic  patch, apparently for about 2 months and notes that her pain is  worsening.  She uses Vicodin p.r.n. and then Demerol for more severe  pain.  She rates her pain today an 8/10, describes it as burning,  stabbing, tingling.   REVIEW OF SYSTEMS:  Notable for the above as well as some continued  problems with dizziness and walking, anxiety, loss of taste, occasional  limb swelling and diarrhea.  Full review has been written in the history  section of the chart.   SOCIAL HISTORY:  The patient is married and family and friends are  supportive.   PHYSICAL EXAMINATION:  VITAL SIGNS:  Blood pressure 123/51, pulse 68,  respiratory rate 18, she is satting 95% on room air.  GENERAL:  The patient is pleasant, alert and oriented x3.  Affect is  bright and appropriate.  She continues to have some left-sided facial  nerve signs.  She has some facial sensitivity on the left 3rd division  of the trigeminal nerve.  Speech is clear, though slightly dysarthric.  HEART:  Regular.  CHEST:  Clear.  ABDOMEN:  Soft, nontender.   ASSESSMENT:  1. Left-sided vestibular Schwannoma with perioperative left-sided      facial nerve injury.  2. Left-sided trigeminal neuralgia.   PLAN:  1. Will give her a try of Methadone low-dose, 5 mg b.i.d. for 1 week      and up to 10 mg b.i.d.  2. Continue Vicodin for breakthrough pain and Demerol for more severe      symptoms on a rare basis.  3. Continue Tofranil-PM as a scheduled medicine.  4. I will follow up with her in about 1 month's time.      Ranelle Oyster, M.D.  Electronically Signed     ZTS/MedQ  D:  01/17/2007 10:38:31  T:  01/17/2007 11:11:13  Job #:  [id number]   cc:   Thomas C. Wall, MD, FACC  [address]  [address]   [place]  [place] [address]   Titus Dubin. Alwyn Ren, MD,FACP,FCCP  [phone] [place]  [place] [address]

## 2010-06-30 NOTE — Procedures (Signed)
NAMETIFFINEY, SPARROW             ACCOUNT NO.:  [id number]   MEDICAL RECORD NO.:  [id number]          PATIENT TYPE:  REC   LOCATION:  [facility]                          FACILITY:  [facility]   PHYSICIAN:  Erick Colace, M.D.DATE OF BIRTH:  February 03, 1946   DATE OF PROCEDURE:  10/13/2006  DATE OF DISCHARGE:                               OPERATIVE REPORT   A 65 year old female with trigeminal neuralgia, acupuncture treatment  #3.   The patient in supine position on exam table.  A 15 mm acupuncture  needle is placed at SI-19, BL-2, ST-2, ST-4, RN-24, E-Stem between SI-  19, ST-4, SI-19, ST-2, and SI-19, RN-24.  Patient tolerated procedure  well.  Consider adding ST-5, ST-6 next visit.  Treatment time 30 minutes  at 20 Hz.      Erick Colace, M.D.  Electronically Signed     AEK/MEDQ  D:  10/14/2006 17:38:36  T:  10/15/2006 20:04:10  Job:  [id number]

## 2010-06-30 NOTE — Assessment & Plan Note (Signed)
[name] is back regarding her facial pain.  She has been doing well with  the methadone 10 mg b.i.d.  It seems to have kept her pain under the  most control.  Her pain will range from a 4 to 5 out of 10.  She uses  the Tofranil, as well as her analgesic cream additionally.  She takes  Vicodin for breakthrough pain.  She does not report any new problems  otherwise today.  She does find that she is a bit more introverted due  to fears of exacerbating her pain in talking too much.  She would like  to exercise a little bit more.  Her cholesterol was recently diagnosed  as being high, and her family physician placed her on Zocor, which she  has not begun yet.   REVIEW OF SYSTEMS:  Notable for the above, as well as some dizziness,  loss of taste, limb swelling, occasional constipation.  Other pertinent  positives listed above.  Full review is in her written health and health  history section.   SOCIAL HISTORY:  Patient is married.  No new changes noted today.   PHYSICAL EXAMINATION:  Blood pressure is 110/64.  Pulse 65.  Respiratory  rate 18.  She is saturating 99% on room air.  Patient is pleasant, alert and oriented x3.  She does have some left facial droop and difficulty with hood closure  today.  She has some numbness along the left 3rd division of trigeminal  nerve additionally.  She was sensitive in that area.  Speech is  intelligible.  Balance is fair.  HEART:  Regular.  CHEST:  Clear.  ABDOMEN:  Soft and nontender.   ASSESSMENT:  1. Left-sided vestibular schwannoma with perioperative facial nerve      injury.  2. Left-sided trigeminal neuralgia.   PLAN:  1. Continue methadone 10 mg b.i.d.  2. I changed her analgesic cream to contain 2% ketamine, 20%      ketoprofen, and 4% Elavil.  3. Maintain Vicodin and Tofranil at current doses.  4. I will see her back in 3 months' time.  I gave her 2 months of      methadone and Vicodin today.      Ranelle Oyster, M.D.  Electronically Signed     ZTS/MedQ  D:  05/12/2007 10:07:23  T:  05/12/2007 11:21:06  Job #:  [id number]   cc:   Thomas C. Wall, MD, FACC  [address]  [address]  [place]  [place] [address]   Titus Dubin. Alwyn Ren, MD,FACP,FCCP  [phone] [place]  [place] [address]

## 2010-06-30 NOTE — Procedures (Signed)
[name], [name]             ACCOUNT NO.:  [id number]   MEDICAL RECORD NO.:  [id number]          PATIENT TYPE:  REC   LOCATION:  [facility]                          FACILITY:  [facility]   PHYSICIAN:  Erick Colace, M.D.DATE OF BIRTH:  [birth year]   DATE OF PROCEDURE:  09/30/2006  DATE OF DISCHARGE:                               OPERATIVE REPORT   PROCEDURE:  Acupuncture treatment.   Had test treatment without Estem performed September 06, 2006, tolerated  well.   NEEDLES PLACED:  Bilateral at ST-2, VL-2, DU-26, SI-18, Estem between SI-  18, DU-26, 2 Hz x30 minutes.  The patient tolerated the procedure well.  We will add RN-24 next visit and possibly ST-4.      Erick Colace, M.D.  Electronically Signed     AEK/MEDQ  D:  09/30/2006 15:42:20  T:  10/01/2006 11:34:14  Job:  [id number]

## 2010-06-30 NOTE — Procedures (Signed)
NAMEMIRIAM, [name]             ACCOUNT NO.:  [id number]   MEDICAL RECORD NO.:  [id number]          PATIENT TYPE:  REC   LOCATION:  [facility]                          FACILITY:  [facility]   PHYSICIAN:  Erick Colace, M.D.DATE OF BIRTH:  Jul 11, 1945   DATE OF PROCEDURE:  11/21/2006  DATE OF DISCHARGE:                               OPERATIVE REPORT   Acupuncture treatment for transluminal neuralgia.  Her pain is back up  to a 7 today.  She had been doing better for a while.  It has been about  two weeks since her last procedure.  Treatment today consists of ST4,  SI19, DU27, RN24, and ST5, between ST5, RN24, and DU27 to needle SI19 at  20 Hertz.  The patient tolerated the procedure well and will return in 2-  3 weeks.      Erick Colace, M.D.  Electronically Signed     AEK/MEDQ  D:  11/21/2006 17:00:49  T:  11/22/2006 00:17:35  Job:  [id number]

## 2010-06-30 NOTE — Assessment & Plan Note (Signed)
[name] is back regarding her facial pain.  She notes ongoing issues with  her face.  She feels the pain may be a bit worse.  She does report at  the same time that she is having difficulty with adhesion of the  Duragesic patches.  She finds the Vicodin does not help as much either.  She saw Dr. Wynetta Emery  last week and has an MRI pending.  He mentioned  another potential interventional procedure.  Patient asked about  acupuncture today which we had discussed previously.  She rates her pain  at 8 out of 10, describes it as burning, stabbing, tingling.  Pain  interferes with general activity, relations with others, and enjoyment  of life on a severe level.   REVIEW OF SYSTEMS:  Notable for the above.  Full review is in the health  and history section.   SOCIAL HISTORY:  Without change.   PHYSICAL EXAMINATION:  Blood pressure is 128/65, pulse is 55,  respiratory rate 16, she is sating 99% on room air.  Patient is  pleasant, alert and oriented x3.  Gait remains slightly wide based and  occasionally she missteps but stable.  Face is sensitive in the left  third division of the trigeminal nerve.  HEART:  Regular.  CHEST:  Clear.  ABDOMEN:  Soft, nontender.   ASSESSMENT:  1. Left-sided vestibular Schwannoma with perioperative left-sided      facial nerve injury.  2. Left-sided trigeminal neuralgia.   PLAN:  1. Will switch her to brand name Duragesic patches in hopes of better      adhesion.  If these fail we will have to look at other long-acting      narcotic options.  2. Renew Vicodin for breakthrough pain.  3. I gave her a few Demerol for severe breakthrough symptoms.  4. Continue Tofranil PM.  5. Await further Neurosurgical input.  6. Will send her to Dr. Jodean Lima for acupuncture as an alternative      treatment.  7. I will see the patient back in about 6 week's time, depending on      her acupuncture schedule.      Ranelle Oyster, M.D.  Electronically Signed     ZTS/MedQ  D:  08/22/2006 13:12:26  T:  08/22/2006 15:37:16  Job #:  [id number]   cc:   Thomas C. Wall, MD, FACC  [address]  [address]  [place]  [place] [address]   Titus Dubin. Alwyn Ren, MD,FACP,FCCP  [phone] [place]  [place] [address]

## 2010-06-30 NOTE — Assessment & Plan Note (Signed)
[name] is back regarding her facial pain.  She has been doing fairly  well with her methadone which is usually 10-15 mg twice a day.  She uses  a bit more depending on her pain intensity.  Her average pain is 6/10  and states that 3/10.  Pain is burning, stabbing, tingling.  Pain  interferes with general activity, relations with others, enjoyment of  life on a moderate level.  Sleep is good.   REVIEW OF SYSTEMS:  Notable for walking, dizziness, anxiety, weakness,  constipation, urinary retention, limb swelling.  Other pertinent  positives are above and full review is in the written health and history  section of the chart.   SOCIAL HISTORY:  Unchanged.   PHYSICAL EXAMINATION:  VITAL SIGNS:  Blood pressure is 133/71, pulse 75,  respiratory rate 18.  She is sating 94% on room air.  GENERAL:  The patient claims to have left facial droop and closure issue  with her left eyelid.  Face is generally unchanged, otherwise.  She has  some sensitivity in the left lower quadrant of the face persistently.  NEUROLOGIC:  Cognitively, she is normal.  EXTREMITIES:  Gait generally stable to improve.  HEART:  Regular.  CHEST:  Clear.  ABDOMEN:  Soft, nontender.   ASSESSMENT:  1. Left vestibular schwannoma with perioperative facial nerve injury.  2. Left trigeminal neuralgia.   PLAN:  1. Continue methadone 10-15 mg b.i.d. #75.  2. Consider Nucynta trial.  3. I will see her back in 3 months with 66-month nursing clinic      followup.      Ranelle Oyster, M.D.  Electronically Signed     ZTS/MedQ  D:  08/27/2008 11:25:12  T:  08/28/2008 04:40:42  Job #:  [id number]   cc:   Titus Dubin. Alwyn Ren, MD,FACP,FCCP  [phone] [place]  [place] [address]

## 2010-07-03 NOTE — Assessment & Plan Note (Signed)
MEDICAL RECORD NUMBER:  [id number]   Neisha is here regarding her left facial nerve palsy and pain syndrome. The  50 mcg Duragesic patch was too strong for her and she got sick and nauseous  with this. She had to go back to 25 mcg. This helps her pain somewhat but  still she is uncomfortable, particularly when her mouth fatigues towards the  end of the day. She rates her pain at an 8/10. She uses a Vicodin 5/500  twice a day p.r.n. She states that the pain is sharp, burning, constant,  tingling. She notes some cross-over over the lip. There is some fatigue and  spasm of the right face as well.   REVIEW OF SYSTEMS:  The patient reports continued problems with vision, has  some ataxia with gait. Denies any fever, chills, or constitutional symptoms.  She has no GU, GI, or cardiorespiratory complaints today. Other pertinent  positives listed above.   SOCIAL HISTORY:  The patient is married and family supportive.   PHYSICAL EXAMINATION:  Blood pressure is 157/59, pulse is 90, respiratory  rate 16, she is saturating 100% in room air. The patient is pleasant, in no  acute distress. She is alert and oriented x3. Affect is bright and  appropriate. Gait is slightly ataxic on exam. Heart is regular rate and  rhythm, lungs are clear, abdomen soft and nontender. The patient continues  to have some left facial nerve weakness with difficulty closing the eye,  although it is improved over prior visits. Face appears to be more  symmetrical today. Still, hyperpathia is noted. Cranial nerve is positive  for seventh nerve findings on the left. Motor function is generally 5/5  except for slight decrease in the left side. Coordination is decreased with  fine motor movements in the left upper and lower extremities.   ASSESSMENT:  Status post left facial nerve injury and facial nerve palsy as  related to her left-sided vestibular schwannoma.   PLAN:  1.  Continue with Duragesic at 25 mcg q.72h.  2.  Will  send the patient to outpatient therapy for E-stim and TENS      treatments.  3.  Refilled Vicodin 5/500 one q.6h. p.r.n. #90.  4.  Begin a trial of Namenda 5 mg q.h.s. increasing to 10 mg b.i.d. over 1      month's time.  5.  I will have her follow up with the R.N. clinic in 1 month's time. I will      see her back in about 2 months.      Ranelle Oyster, M.D.  Electronically Signed     ZTS/MedQ  D:  01/18/2005 11:18:19  T:  01/18/2005 14:06:32  Job #:  [id number]

## 2010-07-03 NOTE — Assessment & Plan Note (Signed)
INTERVAL HISTORY:  [name] is back regarding her left facial nerve palsy and  pain syndrome.  She is doing fairly well with the Duragesic patch at 25  mcg/hr and the Vicodin for breakthrough pain two to three times a day.  She  found that the Namenda was not substantially helpful and she felt that she  was more fatigued with this so she stopped this on her own.  She rates her  pain at 8/10.  She describes it as burning, aching and constant.  She has a  lot of oral tingling inside of the cheek and around the lips.  She has  difficulty drinking out of a cup.  Face is tender on the outside as well.  The TENS unit helps while she is using but it does not last much longer than  that.  She notes occasional fatigue and problems with spasm on the left face  although this is stable.  Emotionally she feels as if she is doing fairly  well although she does get tearful and downtrodden at times.   REVIEW OF SYSTEMS:  Positive for numbness, trouble walking still with  decreased balance particularly when she is fatigued.  Positive nausea,  constipation, and limb swelling.   SOCIAL HISTORY:  The patient lives with her husband who is very supportive.   PHYSICAL EXAMINATION:  Blood pressure 157/82, pulse 82, respiratory rate 16,  she is saturating 98% in room air.  The patient is pleasant in no acute  distress.  She is alert and oriented x3.  Affect is bright.  Gait is stable.  Coordination is fair.  Reflexes are 2+.  Sensation is decreased to fine and  pinprick along the face although it remains hypersensitive to touch.  She  has much better eye closure although she cannot completely close the eye on  the left side.  She has much more dynamic movement of her facial nerve  muscles on the left.   ASSESSMENT:  Status post left facial nerve injury and facial nerve palsy as  related to her left-sided vestibular schwannoma.   PLAN:  1.  Continue Duragesic patch at 25 mcg q.72 h and Vicodin 5/500 q.6 h  p.r.n.  2.  I wrote her a prescription for viscous lidocaine to use in the mouth as      needed for breakthrough symptoms.  3.  Continue with TENS units.  4.  Gave her 2 weeks' trial of Cymbalta to see if this is helpful for both      mood and pain.  We had used this previously while she was on Lyrica so I      am wondering if in fact it more to Lyrica she had problems with than the      Cymbalta itself.  5.  I will see her back in approximately 2 months' time.      Ranelle Oyster, M.D.  Electronically Signed     ZTS/MedQ  D:  04/19/2005 11:25:25  T:  04/20/2005 00:12:31  Job #:  [id number]

## 2010-07-03 NOTE — Assessment & Plan Note (Signed)
MEDICAL RECORD NUMBER:  [id number]   DATE:  June 03, 2004   [name] is back regarding low facial pain.  We initiated Lyrica last visit.  She had problems tolerating it due to facial swelling and some fatigue.  We  had attempted to titrate her over 2-1/2 to 3 weeks' time to 150 mg b.i.d.  I  am not clear as to how far she got, but she realized that she could not  tolerate the medications.  She saw her neurosurgeon who recommended EMG of  the face.  He also started her on a new medicine, but when we examined the  medication, it was the same Lyrica that we had started.  She is currently  taking 50 mg b.i.d. and feels like this is helping her.  She has been on  this for a week.  She does feel a little bit woozy on medications but  feels it is tolerable.   SOCIAL HISTORY:  The patient is not working.  She has applied for  disability.  No other changes are noted.  Family remains supportive.   REVIEW OF SYSTEMS:  The patient reports numbness, tingling, trouble walking,  dizziness, loss of taste and smell.  She has occasional pain in her  shoulders as well as all around her proximal leg just below the knee.   PHYSICAL EXAMINATION:  VITAL SIGNS:  The patient's blood pressure is 140/66,  pulse 81, respiratory rate 16, O2 saturation 99% on room air.  NEUROLOGIC:  The patient is pleasant and alert and oriented x 3.  She still  has some fine motor coordination issues.  Affect is bright an appropriate.  She continue to have left facial droop, decreased tongue control, and  decreased lid closure on the left side.  Hearing is fair to borderline still  on the left side.  The patient remains hypersensitive to touch.  No edema  was noted.  Sclerae of the eye was white and minimally irritate.  Strength  was generally 4+ to 5-/5 on the left side and 5/5 on the right.  Sensory  exam was fair in the extremities.  HEART:  Regular rate and rhythm.  LUNGS:  Clear.  MUSCULOSKELETAL:  Shoulders were negative for  bicipital tendonitis or  rotator cuff symptomatology.  Knee was unremarkable on exam.  Areas were  minimally tender to palpation today.   ASSESSMENT:  1.  Status post left-sided vestibular schwannoma with resection.  2.  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  3.  Left-sided facial pain due to facial nerve palsy.   PLAN:  1.  Recommend increasing Lyrica to 50 mg t.i.d.  If she is unable to      tolerate this, she was instructed to go back to the 50 b.i.d.  2.  Will start Cymbalta as an additive for neuropathic pain beginning at 30      mg daily for 1 week, then 60 mg daily thereafter.  3.  Await results of EMG of the face.  4.  I will see the patient back in two months' time.    ZTS/MedQ  D:  06/03/2004 13:09:42  T:  06/03/2004 14:20:33  Job #:  [id number]

## 2010-07-03 NOTE — Assessment & Plan Note (Signed)
HISTORY OF PRESENT ILLNESS:  [name] is back regarding her left facial pain.  We increased her Duragesic patch to 75 mcg q.72 hours. She does not know if  this has helped. She feels that if anything, her face is a bit weaker and  she is drooling more. She uses Vicodin for breakthrough pain. Her pain is a  6 to 8 out of 10. She has had successful permanent weight placement in the  left eyelid and this seems to be helping with her visual symptoms and dry  eye. The pain is described as burning, stabbing, and aching.   REVIEW OF SYSTEMS:  The patient reports numbness, tingling, trouble walking,  dizziness, anxiety, lip swelling, urine retention, and poor appetite. Nausea  and shortness of breath. Full review is in the health and history section.   SOCIAL HISTORY:  The patient is married, living with her husband.   PHYSICAL EXAMINATION:  VITAL SIGNS:  Blood pressure 154/60, pulse 75,  respiratory rate 18, she is sating 99% on room air.  GENERAL:  The patient is pleasant. Alert and oriented times three.  NEUROLOGIC:  Affect is appropriate. Continues to have some mild decrease in  her balance on the left side. Cranial nerve examination reveals decreased  fine touch and pin prick over the left side of the face and into the jaw,  more over the mandibular region. She still has a facial asymmetry on the  left with difficulty closing the left eye and lifting the left cheek,  however, this is much improved from 1 year ago. Cognitively, she is intact.  Visual examination is stable.  HEART:  Regular rate and rhythm.  LUNGS:  Clear.  ABDOMEN:  Soft, nontender.   ASSESSMENT:  1. History of left sided vestibular schwannoma, status post resection with      facial nerve injury.  2. Left trigeminal neuralgia, as a complication left sided vestibular      schwannoma.   PLAN:  1. Will decrease Duragesic back to 50 mcg q.72 hours, as the increase only      proved to cause more side effects than analgesia  increase.  2. Continue Vicodin 5/500 1 q.6 hours p.r.n. for breakthrough pain.  3. Continue transdermal analgesic cream and oral anesthetic.  4. Will make a referral to Dr. Stevphen Rochester to discuss trigeminal nerve block      with the patient. I think she could benefit      from this. We briefly discussed the procedure today.  5. I will see the patient back here in 2 months time.      Ranelle Oyster, M.D.  Electronically Signed     ZTS/MedQ  D:  11/05/2005 10:46:41  T:  11/07/2005 22:43:47  Job #:  [id number]   cc:   Celene Kras, MD  Fax: [phone]   Titus Dubin. Alwyn Ren, MD,FACP,FCCP  [phone] [place]  [place] [address]

## 2010-07-03 NOTE — Assessment & Plan Note (Signed)
[name] is back regarding her left facial nerve palsy and vestibular  schwannoma with associated left facial pain.  She has liked the transdermal  cream that we ordered with Baclofen, Elavil and Ketoprofen.  She is using it  twice a day.  It tends to help somewhat.  The patch increased to 50 mcg has  been helpful also.  The patient reports her pain on the average is a 6 to  8/10.  She describes it as sharp, tingling.  The pain interferes with  general activity, relationship with others and enjoyment of life on a  moderate to severe level.  Sleep is fair.  She is still debating whether to  get the left eye surgery or not.  She notes five or six hours of relief with  the cream when she uses it.  She has complained of being ill, a little bit  off balance as of late but this has been present however since her initial  surgery.   REVIEW OF SYSTEMS:  The patient reports balance problems as noted above as  well as anxiety, loss of taste, weight gain, constipation, decreased  appetite and limb swelling.  There are pertinent positive listed above and  full review is in the health and history section.   SOCIAL HISTORY:  Without change.   PHYSICAL EXAMINATION:  Blood pressure is 189/92, pulse 76, respiratory rate  17.  She is saturating 100% on room air.  The patient is pleasant and in no  acute distress.  She is alert and oriented x3.  Affect is generally  appropriate.  On neurologic examination, she continues to have decreased  balance to the left with ataxia noted to a certain extent.  She generally  appears stable upon gaze examination today.  Left facial sensation is  decreased on the left and face is sensitivity below the maxilla.  No frank  allodynia is appreciated.  She continues to have difficulty with left eyelid  closure, although it is more symmetrical overall.  She has brow lag on the  left as well as left facial droop.   ASSESSMENT:  Left-sided vestibula schwannoma status post  resection with  residual facial nerve injury and concomitant sensory/pain syndrome with  likely trigeminal nerve activity.   PLAN:  1.  Continued Duragesic 50 mcg q.72h.  2.  The patient is doing well with the transdermal cream so we will stay      with this using it twice to three times a day.  3.  I refilled Vicodin 5/500 1 q.6h. p.r.n.  4.  Consider trigeminal nerve block.  5.  I will see the patient back in three-months' time.  She will see the      [facility] in one month for refills.      Ranelle Oyster, M.D.  Electronically Signed     ZTS/MedQ  D:  08/09/2005 15:42:12  T:  08/09/2005 21:32:04  Job #:  [id number]   cc:   Titus Dubin. Alwyn Ren, M.D. [facility]  [phone] [place]  [place] [address]

## 2010-07-03 NOTE — Op Note (Signed)
NAME:  GENNETT, GARCIA                       ACCOUNT NO.:  [id number]   MEDICAL RECORD NO.:  [id number]                   PATIENT TYPE:  INP   LOCATION:  4732                                 FACILITY:  [facility]   PHYSICIAN:  Doylene Canning. Ladona Ridgel, M.D.               DATE OF BIRTH:  1945/05/02   DATE OF PROCEDURE:  08/15/2003  DATE OF DISCHARGE:                                 OPERATIVE REPORT   PROCEDURE PERFORMED:  Electrophysiologic study and radiofrequency catheter  ablation of atrioventricular node reentrant tachycardia.   CARDIOLOGIST:  Doylene Canning. Ladona Ridgel, M.D.   INDICATIONS:  Recurrent tachy-palpitations and documented SVT with the  patient unwilling to take medical therapy.   I. INTRODUCTION:  The patient is a very pleasant 65 year old woman who  recently was diagnosed with a vestibular schwannoma.  The patient relates a  10-year history of tachy-palpitations and has had documented SVT.  She  recently underwent her vestibular schwannoma resection and was in the rehab  portion of the hospital when she developed recurrent tachy-palpitations with  documented SVT at rates of 190 beats per minute.  This was associated with  chest pain and near-syncope.  The patient was subsequently treated with  intravenous adenosine which resulted in a long period of asystole after the  adenosine infusion, followed by return of sinus rhythm.  She is now referred  for electrophysiologic study and possible catheter ablation.   II. PROCEDURE:  After informed consent was obtained, the patient was taken  to the diagnostic EP lab in a fasting state.  After the usual preparation  and draping, intravenous fentanyl and midazolam were given for sedation.  A  6-French hexapolar catheter was inserted percutaneously into the right  jugular vein and advanced to the coronary sinus.  A 5-French quadripolar  catheter was inserted percutaneously in the right femoral vein and advanced  to the right ventricle.  A 5-French  quadripolar catheter was inserted  percutaneously into the right femoral vein and advanced to the His bundle  region.  A 7-French quadripolar catheter was inserted percutaneously in the  right femoral vein and advanced to the right atrium.  After measurement of  basic intervals, rapid ventricular pacing was carried out from the RV apex  with a pacing cycle length of 600 msec and stepwise decreased to 340 msec,  where a V-A Wenckebach was observed.  During rapid ventricular pacing, the  atrial activation was midline and decremental.  There was no inducible SVT.  Next, programmed ventricular stimulation was carried out from the RV apex at  a basic drive cycle length of 161 msec.  The S1-S2 interval was stepwise  decreased from 540 msec down to 270 msec, where ventricular refractoriness  was observed.  During programmed ventricular stimulation, the atrial  activation was midline and decremental.  Next, programmed atrial stimulation  was carried out from the coronary sinus as well as in the high right atrium  with pacing cycle lengths of 600 msec.  The S1-S2 interval was stepwise  decreased from 540 msec down to 290 msec, resulting in the initiation of  SVT.  This was characterized as a tachycardia initiated with a long A-H  jump.  Additional decrements down to 240 msec resulted in the A-V node ERP  at the slow pathway.  Next, rapid atrial pacing was carried out from the  high right atrium as well as the coronary sinus at pacing cycle lengths of  600 msec and stepwise decreased down to 330 msec, resulting in the  initiation of SVT.  During rapid atrial pacing, the P-R interval became  greater than the R-R interval with initiation of tachycardia.  During  tachycardia, PVCs were placed at the time of His bundle refractoriness and  this failed to pre-excite the atrium.  Ventricular pacing was also carried  out which demonstrated a V-A-V conduction sequence in tachycardia.  With  this findings  documented and also the finding of termination of the  tachycardia with ventricular pacing, the diagnosis of A-V nodal reentrant  tachycardia was observed.  It should also be noted that the atrial  activation was very short and earliest in the His bundle region.  The  ablation catheter was then maneuvered into Koch's triangle.  Three RF energy  applications were delivered at sites 7 through 9 in Koch's triangle which  resulted in accelerated junctional rhythm.  Following ablation, additional  attempts to re-induce the tachycardia were carried out over the next 20  minutes, both from the coronary sinus as well as from the high right atrium,  and there was no inducible tachycardia, and no evidence of residual slow  pathway conduction.  The catheter was then removed, hemostasis was assured  and the patient returned to her room in satisfactory condition.   III. COMPLICATIONS:  There were no immediate procedural complications.   IV. RESULTS:  A.  Baseline ECG:  The baseline ECG demonstrates normal sinus  rhythm with normal axis and intervals.  B.  Baseline intervals:  The sinus node cycle length was 675 msec.  The H-V  interval was 36 msec with a QRS duration 88 msec.  C.  Rapid ventricular pacing:  Rapid ventricular pacing was carried out from  the RV apex at a pacing cycle length of 600 msec and stepwise decreased down  to 340 msec where a V-A Wenckebach was observed.  During rapid ventricular  pacing, the atrial activation was midline and decremental.  D.  Programmed ventricular stimulation:  Programmed ventricular stimulation  was carried out from the RV apex at basic drive cycle length of 161 msec.  The S1-S2 interval was stepwise decreased from 540 msec down to 320 msec,  where the retrograde A-V ERP was observed.  During programmed ventricular  stimulation, the atrial activation was midline and decremental. E.  Programmed atrial stimulation:  Programmed atrial stimulation was  carried  out from the coronary sinuses as well as from the high right atrium  with basic drive cycle lengths of 096 and 500 msec.  The S1-S2 interval was  stepwise decreased from 540 msec down to 240 msec, resulting in the slow  pathway ERP.  During programmed atrial stimulation, there were multiple A-H  jumps and multiple echo beats, as well as inducible SVT.  Following catheter  ablation, there was no inducible SVT and no residual slow pathway  conduction.  F.  Rapid atrial pacing:  Rapid atrial pacing was carried out from the  coronary sinus as well as the high right atrium with pacing cycle lengths of  500 msec and stepwise decreased down to 320 msec, where tachycardia was  initiated.  Following catheter ablation, the pacing cycle length was  stepwise decremented down to 330 msec, where A-V Wenckebach was observed.  G.  Arrhythmias observed:  1. A-V nodal reentrant tachycardia.  Initiation was with programmed atrial     stimulation as well as high right atrial and coronary sinus pacing.  The     duration was sustained.  The cycle length was 350 msec.  Termination of     the tachycardia was carried out with ventricular pacing.     A. Mapping:  Mapping of Koch's triangle demonstrated the usual size and        orientation.        A. RF energy application:  A total of 3 RF energy applications were           delivered at sites 7 through 9 in [place] triangle, resulting in           prolonged accelerated junctional rhythm.  Following catheter           ablation, there was no inducible SVT and no slow pathway           conduction.   V. CONCLUSION:  This study demonstrated successful electrophysiologic study  and radiofrequency catheter ablation of typical atrioventricular node  reentrant tachycardia with a total of 3 radiofrequency energy applications  delivered at sites 7 through 9 in [place] triangle, resulting in rendering the  tachycardia non-inducible and resulting in abolition of any slow pathway   conduction.                                               Doylene Canning. Ladona Ridgel, M.D.    GWT/MEDQ  D:  08/15/2003  T:  08/16/2003  Job:  [id number]   cc:   Thomas C. Wall, M.D.   Titus Dubin. Alwyn Ren, M.D. [facility]   Dr. Andrey Campanile, neurosurgery

## 2010-07-03 NOTE — Assessment & Plan Note (Signed)
MEDICAL RECORD NUMBER:  [id number]   DATE OF BIRTH:  1945-07-11   DATE OF VISIT:  September 16, 2004   INTERVAL HISTORY:  [name] is here in followup of her left facial nerve palsy  and facial pain.  She tried the Ultram ER samples but found no real benefit  with these.  She did not fill the prescription.  She is still using three  Vicodin a day at the 5/500 dose.  She is going to see an ENT doctor in  [place] later this month for further surgical recommendations.  Her pain is  still a 9/10.  She has problems with balance as well and uses a cane when  she is up.  She wears an eye patch at night to cover the eye.  She still  suffers from significant dryness, however, in the left side.  She does use  multiple drops to help keep the eyeball lubricated.   REVIEW OF SYSTEMS:  Patient reports numbness on the left side of the face,  trouble walking, dizziness, urine retention and limb swelling.   SOCIAL HISTORY:  Patient was denied disability but is seeking an appeal.   PHYSICAL EXAMINATION:  Blood pressure is 155/83, pulse is 72, respiratory  rate 16, she is saturating 99% in room air.  Patient is pleasant; no acute  distress.  Patient may have a bit better lid closure and left-sided facial  control but minimally so.  She has left tongue deviation slightly with  decreased crinkling of the left brow noted also.  Balance is still poor and  she tends to fall often to the right side.  Left facial region is  hypersensitive to touch.  Sclera slightly irritated but minimally so.  The  sclera is anicteric.  Strength is 4+ to 5/5 on the left and 5/5 on the  right.   ASSESSMENT:  1.  Left facial pain due to facial nerve injury.  2.  Status post left-sided vestibular schwannoma resection.   PLAN:  1.  Initiate Duragesic patch 12.5 mcg q.72h.  We discussed effects and side      effects today.  2.  She may use her Vicodin 5/500 for breakthrough pain.  3.  Await surgical plan.  4.  Consider  further titration at next month's followup visit.       ZTS/MedQ  D:  09/16/2004 11:57:01  T:  09/16/2004 18:57:35  Job #:  [id number]

## 2010-07-03 NOTE — Assessment & Plan Note (Signed)
DATE OF VISIT:  April 07, 2004.   MEDICAL RECORD NUMBER:  [id number].   [name] is back regarding her left facial pain, facial nerve palsy and  neuropathy.  She has been doing fairly well from a balance standpoint.  Her  balance has been a little bit off.  She has had a recent ear infection.  She  did not receive much effects from the Gabitril, so she stopped the  medication.  She was unable to tolerate Topamax and Neurontin essentially as  well.  The pain is still debilitating, ranging from a 2-9/10.  Vicodin 5/500  mg seems to break the pain to a certain extent.  The pain is made worse with  talking activity in general and is better with heat, ice and medications.  Her surgeon had nothing new to recommend at her last visit.  They were  considering some type of eyelid surgery to help with closure.  She does  report that the eye gets very dry at nighttime, even with the patch.  She is  not using an air humidifier at home.   SOCIAL HISTORY:  The patient remains married and has a supportive family.   REVIEW OF SYSTEMS:  The patient reports blurred vision, taste loss, numbness  and dizziness.  Denies any nausea, vomiting, diarrhea, constipation, bladder  or bowel incontinence, fever, chills, weight changes, sweat problems,  bruising or rash.  She has had no chest pain or coughing recently.   PHYSICAL EXAMINATION:  The blood pressure is 128/59, the pulse is 77 and the  respiratory rate is 20.  She is saturating 100% on room air.  Gait is  generally stable.  It is slightly wide based.  The patient had problems with  heel to toe ambulation.  Affect is bright and appropriate.  Appearance is  well kept.  She continues to have left facial droop and decreased tongue  control.  The left eye is unable to close.  Her hearing is fair to  borderline on the left side.  The face is sensitive to touch.  No obvious  edema or skin color changes are noted.  Strength is in the 4+-5/5 range.  Generally the  sensory exam was intact.  The cranial nerve exam otherwise was  stable.  The heart was regular rate and rhythm.  The lungs were clear.   ASSESSMENT:  1.  Status post left-sided vestibular schwannoma with resection.  2.  Facial nerve palsy due to left-sided vestibular schwannoma.  3.  Left-sided facial pain due to facial nerve palsy.   PLAN:  1.  Will try the patient on Lyrica, titrating slowly up over 15 days' time      to 150 mg b.i.d.  Will begin dosing at 50mg  qhs.  2.  Refill the Vicodin 5/500 mg.  3.  Recommended air humidifier and candy to soothe the cheek including      lozenges and hot-cinammon flavored candy.  I would like to look into      capsicin based candy as well.  4.  Look into nerve blocks to the left side of the face as well.  5.  Will see the patient back in approximately two months' time.  She will      call me with any problems.  May consider Cymbalta as a supplementary      medication to the Lyrica if results are inadequate.      ZTS/MedQ  D:  04/07/2004 16:04:07  T:  04/07/2004 18:17:35  Job #:  [id number]   cc:   Leanord Asal, M.D.

## 2010-07-03 NOTE — Assessment & Plan Note (Signed)
HISTORY:  The patient is back regarding her facial pain.  She saw  surgery at [facility] who had nothing to recommend except for surgery and the  patient was not eager to pursue this route.  Pain remains about the  same.  She is fairly controlled with the Duragesic and Vicodin.  She  wonders if an increase in her Duragesic may be helpful.  She does  complain of daytime fatigue.  She has pain right now reported at 5 out  of 10.  She describes the pain as burning, stabbing and tingling.  Pain  interferes with general activity, relations with others, and enjoyment  of life on a moderate level.  Pain seems to be worse with excessive  talking.   REVIEW OF SYSTEMS:  Notable for the above as well as loss of taste and  smell.  Other pertinent issues are in the health and history section in  the HPI above.   SOCIAL HISTORY:  Patient is married, her husband is very supportive.   PHYSICAL EXAMINATION:  VITAL SIGNS:  Blood pressure:  155/75.  Pulse:  70.  Respiratory rate:  16.  She is saturating 99% on room air.  GENERAL:  Patient is pleasant, in no acute distress.  She is alert and  oriented x 3.  NEUROLOGICAL:  Gait is wide-based but stable.  Continues to have mild  left facial droop and ptosis on the left with minimal dysarthria.  The  patient remains hypersensitive to touch.  HEART:  Regular.  CHEST:  Clear.  ABDOMEN:  Soft and nontender.   ASSESSMENT:  1. Left-sided vestibular schwannoma status post resection with      perioperative facial nerve injury.  2. Left-sided trigeminal neuralgia as a central complication of her      surgery and schwannoma.   PLAN:  1. I would like to keep her Duragesic at 50 micrograms for now.  2. Maintain Vicodin 5/500, one q.6.h. p.r.n.  3. Continue analgesic cream.  4. Add low-dose Tofranil PM 75 mg q.h.s. for mood, sleep and pain.  5. Could consider methadone for chronic pain control although I would      like to have clearance from cardiology      first  due to the potential conduction effects on the heart.  6. We will discuss other alternative treatments later including      central brain stimulation.      Ranelle Oyster, M.D.  Electronically Signed     ZTS/MedQ  D:  06/21/2006 12:05:19  T:  06/21/2006 12:23:22  Job #:  [id number]   cc:   Thomas C. Wall, MD, FACC  [address]  [address]  [place]  [place] [address]   Titus Dubin. Alwyn Ren, MD,FACP,FCCP  [phone] [place]  [place] [address]

## 2010-07-03 NOTE — Assessment & Plan Note (Signed)
MEDICAL RECORD NUMBER:  [id number]   DATE OF BIRTH:  [birth year]   REASON FOR EVALUATION:  [name] is back regarding her facial pain.  She  happened to take a few extra Vicodin with the change in weather.  Her  patch is back at 50 mcg q.72 h. for baseline control.  She was set up to  see Dr. Stevphen Rochester for evaluation of trigeminal nerve block, but she had to  cancel due to problems with her blood pressure.  This has been corrected  by Cardiology.  She is on a new blood pressure medication as of today.  The patient rates her pain at 6-8/10 and she describes it as burning and  tingling over the left face.  It bothers her more with talking and  eating.  It improves with rest, medications and heat.   REVIEW OF SYSTEMS:  Positive for numbness and dizziness at times still  due to her vestibular schwannoma.  This has improved overall.   MEDICATIONS:  1. Atenolol/hydrochlorothiazide 50/12.5 mg one a day.  2. Vicodin 5/500 one q.6 h. p.r.n.  3. Fentanyl 50 mcg q.72 h.   PHYSICAL EXAMINATION:  VITAL SIGNS:  Blood pressure is 132/49.  Pulse is  62.  Respiratory rate is 16.  She is saturating 100% on room air.  GENERAL:  The patient is pleasant, in no acute distress.  NEUROLOGIC:  She is alert and oriented x3.  Affect is generally  appropriate.  The patient has mild decrease of balance on the left, but  was able to compensate.  She continues to have decreased fine touch and  pinprick over the left face, although the face is sensitive to touch.  Pain extends from the left ear over the angle of the jaw to the chin.  She has a mild facial asymmetry still with cranial nerve VII palsy noted  on the left.  Eye closure is much improved with implantation of the  weight in the lid.  Cognitively, she is stable.  Vision is unchanged.  HEART:  Regular rate and rhythm.  CHEST:  Clear.  ABDOMEN:  Soft and nontender.   ASSESSMENT:  1. Left-sided vestibular schwannoma, status post resection with facial   nerve injury.  2. Left trigeminal neuralgia as a complication of her schwannoma and      surgery.   PLAN:  1. We will continue Duragesic 50 mcg q.72 h.  2. Begin trial of Trileptal 150 mg nightly for 1 week, then increase      to t.i.d. over 2 weeks' time period.  3. Vicodin 5/500 one q.6 h. p.r.n. for breakthrough pain.  4. Consider trigeminal nerve block.  5. Consider gamma knife.  6. I will see the patient back in 2 months' time.  She will follow up      with the nurse clinic in 1 month to consider a change from fentanyl      to OxyContin for baseline pain control.      Ranelle Oyster, M.D.  Electronically Signed     ZTS/MedQ  D:  12/29/2005 09:42:51  T:  12/29/2005 10:34:46  Job #:  [id number]   cc:   Thomas C. Wall, MD, FACC  [address]  [address]  [place]  [place] [address]   Titus Dubin. Alwyn Ren, MD,FACP,FCCP  [phone] [place]  [place] [address]

## 2010-07-03 NOTE — Assessment & Plan Note (Signed)
[name] is back regarding her facial pain.  Pain really has been stable.  She has increase in symptoms due to cold weather.  She was not real  found of the Trileptal so she has come off this as well.  She is using  Thera-Gesic 50 mcg q.72 hours and Vicodin 5/500 1 q.6hours p.r.n.  She  rates her pain of an 8 out of 10 at times.  Pain is described as  burning, stabbing and tingling.  Pain interferes with general activity,  relations with other and enjoyment of life on a moderate to severe  level.  Sleep is fair.  Pain is worse with cold weather, sometimes  talking and chewing.   REVIEW OF SYSTEMS:  Patient reports still some dizziness when walking.  Depression is an issue as well.  In addition to loss of taste and smell,  nausea, constipation, poor appetite at times with some limb swelling.   PHYSICAL EXAMINATION:  Blood pressure is 142/38, pulse was 66,  respiratory rate 16, sating 100% on room air.  Patient is generally  pleasant, no acute distress.  Patient is alert and orientated x3.  Affects bright and appropriate.  Gait is slightly ataxic, otherwise she  is well compensated.  On the left, facial assymetry is still noticeable  but mild.  She has a weight in the left eyelid.  It decreased  appropriate section of the left face, but remains hyposensitive somewhat  to touch.  She has mild dysarthria.  Vision is stable.  Mood is  generally appropriate.  HEART:  Regular rate and rhythm.  LUNGS:  Clear.  ABDOMEN:  Soft nontender.   ASSESSMENT:  1. Left sided vestibular schwannoma status post resection with      perioperative facial nerve injury.  2. Left trigeminal neuralgia as a complication of her schwannoma and      surgery.   PLAN:  1. Continue Thera-Gesic 50 mcg q.72hours.  2. Vicodin 5/500 mg 1 q.6hours p.r.n.  3. Will ask Dr.Cram to see patient in follow up to make patient's      neurosurgical follow up more convenient.  Also have him discuss any      other potential  interventional options for pain.  Patient has been      a bit resistant to trying any types of injections or blocks at this      point.  4. Consider other analgesics for pain control, although from a      neurological stand point she has tolerated the fentanyl the best.  5. Consider another antidepressant.  6. Will see the patient back in 3 months time.  She will see the nurse      clinic back in 1 month.  I encouraged her to increase her activity      and increase her social interactions as possible.      Ranelle Oyster, M.D.  Electronically Signed     ZTS/MedQ  D:  02/22/2006 11:05:34  T:  02/22/2006 13:06:50  Job #:  [id number]   cc:   Thomas C. Wall, MD, FACC  [address]  [address]  [place]  [place] [address]   Titus Dubin. Alwyn Ren, MD,FACP,FCCP  [phone] [place]  [place] [address]

## 2010-07-03 NOTE — Assessment & Plan Note (Signed)
The patient is here in followup of her inpatient rehab course.  She was  admitted status post left vestibular schwannoma resection with subsequent  left-sided weakness, balance deficits, left facial nerve injury, and left-  sided hearing loss.  The patient was discharged to home with a rolling  walker and the assistance of her husband and family. She has progressed  nicely at this point.  She is using the rolling walker for prolonged  distances but generally walks without a device at home.  She has noted some  mild improvement in her left facial function.  She is using a patch still  during the daytime and her eyelid weight at night.  She uses her various  ointment for the left eye as well to provide lubrication.  She has had no  significant problems with infection or irritation at the eye.  She has had  good reports from her surgeon at this point.  She still has significant  weakness in cranial nerve 7 for which the prognosis is not good for at this  point.   The patient denies any problems with mood or sleep.  She is having some pain  over the left face.  She was started on some Neurontin initially for the  nerve pain but stopped the medication as she did not feel that it was  helping.  She really only the 100-mg t.i.d. dose.  The patient has also  noticed some swelling in the bilateral ankles and feet.  It seems to have  improved lately, but still is an issue.   On review of systems the patient denies any chest pain, shortness of breath,  cold, flu, wheezing, or coughing symptoms. Denies weakness, spasm, stroke,  vertigo, confusion.  Denies problems with depression, sleep, or headaches.  Does report hearing loss, anxiety, blurred vision, and occasional double  vision, numbness, and dizziness.  The patient denies nausea, vomiting,  diarrhea, constipation, bowel, or bladder incontinence.  Denies fever,  chills, or bleeding. She has had some weight gain.   On physical examination today  blood pressure is 109/58, pulse 72,  respiratory rate 18.  She is saturating 99% on room air.  The patient walks  with fairly normal gait with minimal limp. Her affect is bright and  appropriate.  She is generally well kept.  On cranial nerve examination she  still has obvious left-sided cranial nerve 7 findings with decreased lid  closure, left facial droop, left hemisensory loss around the forehead.  She  is  hypersensitive along the left cheek.  Tongue protrudes and is fairly  normal.  She is able to  move her eyes in all planes and has fairly good  visual acuity today.  Motor function in both upper and lower extremities is  5/5 bilaterally.  Sensory function is grossly intact on the left side.  Reflexes are 2+.  The patient walked today for me and had no difficulties  with her balance, change in directions, or transfers.  Cognition was  appropriate.  Heart was regular rate and rhythm.  Lungs were clear.  Abdomen  was soft, nontender. Bowel sounds were positive.  Skin was intact.  She did  have 1+ edema at the bilateral ankles.   ASSESSMENT:  1.  Status post resection of left-sided vestibular schwannoma.  2.  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  3.  History of supraventricular tachycardia.  4.  History of hypertension.  5.  Lower extremity edema.  6.  Left-sided facial nerve pain due to injury.   PLAN:  1.  The patient has done extremely well to this point.  She is fine to walk      without her walker for short distances.  2.  Would like therapy to work with the patient on some E stim to the facial      muscles.  They will attempt to set her up with a home stimulator unit      and then transition her to a home program on her own.  3.  For her pain, we will restart her Neurontin that was initially begun, I      believe, by Dr. Andrey Campanile.  We will begin 100 mg t.i.d. and titrate up to      300 mg t.i.d. over 10 days' time.  If this unsuccessful, will consider       another agent.  4.  I wrote the patient a script for Lasix 20 mg daily p.r.n. swelling in      the lower extremities.  I urged her to follow      up with Dr. Daleen Squibb, cardiology, as able.  5.  Will see the patient back in about 4 to 6 weeks' time.      Ranelle Oyster, M.D.   ZTS/MedQ  D:  10/22/2003 15:46:13  T:  10/22/2003 21:25:07  Job #:  [id number]   cc:   Thomas C. Wall, M.D.   Angelene Giovanni, MD  [facility]  [facility]

## 2010-07-03 NOTE — Discharge Summary (Signed)
NAME:  [name], [name]                       ACCOUNT NO.:  [id number]   MEDICAL RECORD NO.:  [id number]                   PATIENT TYPE:  IPS   LOCATION:  4006                                 FACILITY:  [facility]   PHYSICIAN:  Ranelle Oyster, M.D.             DATE OF BIRTH:  Oct 25, 1945   DATE OF ADMISSION:  08/19/2003  DATE OF DISCHARGE:  09/03/2003                                 DISCHARGE SUMMARY   DISCHARGE DIAGNOSES:  1. Left vestibular schwannoma status post resection.  2. Supraventricular tachycardia status post ablation.  3. History of hypertension.  4. Cranial nerve VII palsy, left hearing loss.   HISTORY OF PRESENT ILLNESS:  The patient is a 65 year old white female  initially admitted to Rehab on August 09, 2003 status post left vestibular  schwannoma resection at [facility], but transferred to Cardiology Service on  August 14, 2002 after developing SVT's with heart rates of 190s.   The patient underwent electrophysiological study and radiofrequencies  catheter ablation of the AV node secondary to recurrent tachycardia.  The  patient is now in normal sinus rhythm.  She was placed on Lovenox for DVT  prophylaxis.  The patient also had a 1.4 millimeter ME titanium weights  taped to left eye by Dr. Sheffield Slider on August 14, 2003, secondary to cranial nerve  VII palsy.   PT report at this time -- the patient did not have any therapies while on  Cardiology Unit, echocardiogram was performed on August 14, 2003 which  revealed an ejection fraction of 55 to 65%.  The patient was transferred  back to [facility] on August 19, 2003, to complete rehab therapy.   PAST MEDICAL HISTORY:  Denies any cardiovascular disease or diabetes.  Positive hypertension and ruptured disk.  Denies any strokes.   PAST SURGICAL HISTORY:  Significant for appendectomy, back surgery,  hysterectomy.   FAMILY HISTORY:  Noncontributory.   SOCIAL HISTORY:  [place]  Three steps to entry, was independent prior to admission, denies  any tobacco or alcohol use.  One son who lives next door who works full-time  as a Theatre stage manager.  She can get some assistance from her husband.  She has  good family support.  Denies any tobacco or alcohol use.  Functional history prior to admission -- she was independent prior to  admission.   MEDICATIONS PRIOR TO ADMISSION:  Estradiol and blood pressure medicine.   ALLERGIES:  MORPHINE.   REVIEW OF SYSTEMS:  Significant for decreased hearing in the left ear.  Positive weakness.  Denies any chest pain or shortness of breath.   HOSPITAL COURSE:  [name] was readmitted to [facility] Rehab  Department on August 19, 2003 for comprehensive patient rehabilitation, where  she received more than three hours of therapy daily.  Overall, [name]  progressed very well during her stay in rehab.  She had  no significant  headaches, surgical incision healed very well, she is to follow up with  neurosurgery at the time of discharge.  She was discharged to overall rehab  with supervision level to min assisted level.  She was able to ambulate  approximately 150 to 130 feet with supervision level.  Transfers -- sit to  stand with supervision level, and bed mobility modified independent level.  The patient was able to perform most ADLs with min assist to supervision  level.  Overall she progressed very well and made her goals.  The patient  initially was placed on Lovenox for DVT prophylaxis, this was discontinued  on September 02, 2003.  There is no bleeding complications noted after Lovenox.   The patient was started on antibiotic on August 23, 2003 for a urinary tract  infection, amoxicillin 250 mg t.i.d. for seven days; this was discontinued  on August 24, 2003 due to Macrodantin sensitivity.  The patient completed her  Decadron taper.  Initially the patient was on artificial tears and Lacrilube  due to her cranial nerve  VII palsy, this was discontinued due to weight  placement on her eye.  The patient, on August 27, 2003, it was noted the  patient's heart rate began to be slightly elevated, she was started on  Lopressor 25 mg p.o. daily and tachycardia did improve.  Cardiology was  called and contracted to see the patient prior to discharge, per patient  request.  Unfortunately cardiology did not see the patient prior to  discharge and she will follow up with cardiology outpatient.  After  Lopressor heart rate remained stable.  There were no other major issues  occurred while the patient was in rehab.   Latest labs -- latest sodium 139, potassium 4.1, chloride 105, carbon  dioxide 31, glucose 112, BUN 4, creatinine 0.5, hemoglobin 11.2, hematocrit  32.2, white blood cell count 12.0, platelet count 258,000, AST 20, ALT 40.  PT report at time of discharge indicate the patient is able to ambulate  approximately 150 feet with supervision with rolling walker, able to  transfer with min assist, sit to stand with rolling walker, and use a  wheelchair for long distances, bed mobility modified independently.  Able to  perform most ADLs with close supervision and modified independent.  __________ cognition, everything was in functional limits.  She did have  some short term memory deficits, mild to moderate deficits verbal, problem  solving modified independently.  She still has some significant hearing loss  in the left ear.  Overall, the patient requires max assist to complete up  and down stairs secondary to lower extremity weakness.  Wheelchair goal:  The patient still is supervision to moderate independent secondary to  occasionally needing to ask for objects on the left.  The patient progressed  well in all functional mobility.  From an OT standpoint the patient overall  min assist supervision for all admission long term goals, motivated,  progressed nicely, however overall lack of strength and poor vision  and memory deficits limited her progress towards goals.  The patient was  discharged to home with her family at supervision level.  At the time of  discharge all vitals were stable, blood pressure 105/57, pulse was 90,  respiratory rate was 20, temperature 97.1.  The patient had an eye patch on  the left eye with weight, and discharge medications include Lopressor 25 mg  daily, estradiol 1 mg daily, Maxzide 75/50 on hold for now, do not take,  multivitamin 1 tab daily.  For pain meds she will use Tylenol.  She will  wear an eye patch on the left eye.  No drinking alcohol, no smoking, no  driving, use wheelchair for long distances and walker.  She has __________  home health care for PT and OT.  She is to follow up with her neurosurgeon  Dr. Andrey Campanile in two weeks, call for appointment.  Follow up with Dr. Mary Sella in three weeks, and follow up with cardiology, Dr. Daleen Squibb within three  to four weeks, and Dr. Riley Kill on August 21, 2003.      Drucilla Schmidt, P.A.                         Ranelle Oyster, M.D.    LB/MEDQ  D:  09/03/2003  T:  09/03/2003  Job:  [id number]   cc:   Ranelle Oyster, M.D.  [address] [place]  [place] [address]  Fax: [phone]   Titus Dubin. Alwyn Ren, M.D. [facility]   Maisie Fus C. Wall, M.D.   John A. Andrey Campanile, MD  [facility]   Mary Sella, MD  [phone]

## 2010-07-03 NOTE — Assessment & Plan Note (Signed)
[facility]                              CARDIOLOGY OFFICE NOTE   NAME:Wong, Traci JEZEWSKI                    MRN:          [id number]  DATE:11/26/2005                            DOB:          1945-05-22    [name] returns today for followup of her palpitations and  hypertension.   Her blood pressures have been within normal limits with multiple checks  given to me today over the last couple of weeks.  She is averaging about 115  to 120 systolic over 60 to 70 diastolic.  Her heart rates have been in the  60s.  She has had very few palpitations.  This is on the  atenolol/hydrochlorothiazide as outlined in my previous note.  She  misunderstood [place], and has not been taking her spironolactone 25 mg a day.  She is due a BMP and a magnesium today.   We also checked a 2D echo, which showed EF of 65% to 70% with no LVH and no  prolapse.   Her blood pressure today is 120/72, her pulse is 76 and regular, weight is  144.   The rest of her exam is unchanged.   ASSESSMENT AND PLAN:  [name] has been through a lot with her brain  tumor and now residual chronic pain.  I think this has attributed to  increase in her blood pressure and palpitations.  I am delighted she is well  controlled on her current program.   We will check a BMP today.  If her potassium is low, we will have her resume  her spironolactone at 25 mg a day.  I will see her back again in three  months.   Please note that her LDL is 161.  I have addressed the possibility of taking  a statin down the road.  She will think about it.       Thomas C. Daleen Squibb, MD, [facility]     TCW/MedQ  DD:  11/26/2005  DT:  11/28/2005  Job #:  [id number]   cc:   Titus Dubin. Alwyn Ren, MD,FACP,FCCP

## 2010-07-03 NOTE — Assessment & Plan Note (Signed)
Tyson is back regarding her facial pain.  The pain has been about the  same.  She is going to see Dr. Landis Martins, at [facility], who apparently is a  neurosurgeon specializing in this type of pain problem.  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  She has been hesitant to look at  nerve blocks in the past as well.  She has done better with the patch  but feels that at times it does not stick.  She rates her pain on the  average an 8/10, described as sharp, burning, stabbing.  Pain interferes  with general activity, relationships with others and enjoyment of life  on a moderate to severe level.  Sleep is fairly good.  She remains on  Duragesic patch 50 mcg q.72 hours and Vicodin one q.6h. p.r.n.   REVIEW OF SYSTEMS:  Notable for the above as well as occasional  dizziness, trouble walking, loss of taste.  A full review is in the  written Health and History section.   SOCIAL HISTORY:  Without change.   PHYSICAL EXAM:  Blood pressure is 119/54, pulse is 75, respiratory rate  16, sating 99% on room air.  The patient is pleasant, in no acute  distress.  She is alert and oriented x3.  Gait is slightly wide-based  and compensated.  She has mild left facial droop and ptosis on the left  but this is much improved.  She has some mild dysarthria and sensitivity  to the face but this is stable.  HEART:  Regular.  CHEST:  Clear.  ABDOMEN:  Soft, nontender.   ASSESSMENT:  1. Left-sided vestibular schwannoma status post resection with      perioperative facial nerve injury.  2. Left trigeminal neuralgia as a complication of her schwannoma and      surgery.   PLAN:  1. Continue Duragesic 50 mcg q.72 hours.  2. Vicodin 5/500, one q.6h. p.r.n.  3. Await Neurosurgery consult.  4. Continue analgesic cream.  5. Consider other long-acting agents for pain control.  Will discuss      after her Neurosurgery appointment.      Ranelle Oyster, M.D.  Electronically Signed     ZTS/MedQ  D:   05/24/2006 14:19:23  T:  05/24/2006 17:38:29  Job #:  [id number]   cc:   Thomas C. Wall, MD, FACC  [address]  [address]  [place]  [place] [address]   Titus Dubin. Alwyn Ren, MD,FACP,FCCP  [phone] [place]  [place] [address]

## 2010-07-03 NOTE — Assessment & Plan Note (Signed)
MEDICAL RECORD NUMBER:  Is #[id number].   [name] is here in followup of her left facial pain and cranial nerve seven  palsy.  She has really had no changes in the motor function of the left side  since we last talked.  We tried Topamax for some of her pain symptoms, and  she had problems with confusion and hallucinations on the medication, and  she stopped it.  She did find some success with some old magic mouthwash she  used as well as old Vicodin which relieved pain to a certain extent.  She  has talked with her surgeon who really had nothing new to offer her from a  surgical standpoint.  The pain ranges from a 2 to 8 out of 10.  It is often  worse at night and with talking.  It is better with ice and medications as  noted above.   SOCIAL HISTORY:  She is married and tells me she may be running out of  insurance come February 16, 2004.   REVIEW OF SYSTEMS:  The patient reports swelling, dizziness, occasional  vertigo, blurred vision, taste loss, and swelling.  Full review of systems  is in the health and history section of the chart.   PHYSICAL EXAMINATION:  VITAL SIGNS:  Blood pressure is 142/75, pulse is 86,  respiratory rate is 14.  She is sating 100% on room air.  AFFECT:  Bright and alert.  APPEARANCE:  Well kept.  MUSCULOSKELETAL:  She does have the left sided facial droop and poor lid  closure.  She has impaired tongue control and sensation as well.  There has  been no significant change since last time.  The patient tends to hold her  hand up on the face and massage and support the area.  MENTAL STATUS:  Cognition was appropriate today.  NEUROLOGIC:  Other cranial nerves were intact.  Balance and gait were good  today with no obvious losses of balance noted.  Strength seemed to be near  symmetrical bilaterally at 4+ to 5 out of 5.  HEART:  Regular rate and rhythm.  LUNGS:  Clear.  ABDOMEN:  Soft and nontender.  EXTREMITIES:  Showed no clubbing, cyanosis, or edema.   ASSESSMENT:  1.  Status post resection of left sided vestibular schwannoma.  2.  Cranial nerve seven and cranial nerve eight palsy due to number one.  3.  Left sided facial nerve pain.   PLAN:  1.  I gave the patient samples of Gabitril to start at 2 mg q.h.s. x 1 week,      then up to 2 mg b.i.d. thereafter.  If she finds any sort of relief with      this medication, she will call for a prescription.  2.  I gave her magic mouthwash today for symptomatic relief of the cheek and      gums.  3.  She may use Vicodin 5/500 for breakthrough pain, half of one tablet      q.6h. p.r.n.  4.  I will see her back in three months here in the office potentially.      Zach  ZTS/MedQ  D:  01/08/2004 11:31:46  T:  01/08/2004 14:48:35  Job #:  [id number]   cc:   Leanord Asal neurosurgery, Dr.

## 2010-07-03 NOTE — Assessment & Plan Note (Signed)
MEDICAL RECORD NUMBER:  [id number]   [name] is back regarding her left facial nerve palsy and pain. She has done  real well with the Duragesic patch at 12.5 mcg q.72h. She has a little bit  of sedation with it but notes her pain is much improved. Her average pain is  between a 5 and an 8 out of 10. She describes it as constant, tingling, and  burning. She is sleeping better. She uses fewer Vicodin now because of the  patch. She is using maybe one Vicodin a day, sometimes two. She saw  neurosurgery who recommended weight placement on the eyelid as well as  potential surgery on the brow to lift the brow and forehead. The patient  still has difficulty applying for disability. She has been denied twice, I  believe. She is trying to find some work.   REVIEW OF SYSTEMS:  The patient reports numbness, dizziness, weakness, loss  of taste or spell. She is still having problems with vertigo and balance  when she is up and changing positions. Does report occasional limb swelling.  Denies any fever, chills, urinary problems. Has occasional nausea.   SOCIAL HISTORY:  Pertinent positives listed above. She is living with her  husband.   PHYSICAL EXAMINATION:  Blood pressure is 139/77, pulse is 76, respiratory  rate 16, she is saturating 100% in room air. The patient is pleasant, no  acute distress. Her facial symmetry seems somewhat improved. She still has  difficulty with eyelid closure. Her left brow is deviated downwards. Tongue  deviation is still to the left as well, as well as some left facial droop.  The face is hypersensitive to touch. The patient tends to lean somewhat  towards the right with ambulation. Strength is generally at 4+ to 5 out of 5  on the left side. Sclera remains slightly irritated on the left but lesser  so than on previous visits. Cognitively, the patient is appropriate. Pulses  are 2+ and regular. Chest is clear.   ASSESSMENT:  1.  Left facial pain due to facial nerve  injury.  2.  Status post left-sided vestibular schwannoma resection.   PLAN:  1.  Will increase Duragesic to 25 mcg q.72h. Will see if she can tolerate it      from a fatigue standpoint.  2.  Continue Vicodin 5/500 for breakthrough pain.  3.  Surgical plan as outlined above.  4.  Will see the patient back in about 2 months' time.      Ranelle Oyster, M.D.  Electronically Signed     ZTS/MedQ  D:  10/09/2004 11:46:52  T:  10/09/2004 14:27:08  Job #:  [id number]

## 2010-07-03 NOTE — Assessment & Plan Note (Signed)
HISTORY:  [name] is back regarding her left facial nerve palsy and pain. She  continues to have pain despite the Duragesic patch and the transdermal cream  we prescribed. She uses Vicodin for breakthrough symptoms. She had her eye  surgery on August 20 with placement of a permanent weight into the left  eyelid. This seems to be helping with her appearance and lid closure. She  still rates her pain as a 7/10. She described it as burning, constant,  tingling. Pain is mostly prominent in the daytime and evening. Sleep is  fair.   REVIEW OF SYSTEMS:  The patient reports numbness, trouble walking, still  with some decrease in balance and dizziness, anxiety, loss of taste, urinary  retention, constipation, limb swelling and weight gain.   SOCIAL HISTORY:  The patient is married and living with her husband.   PHYSICAL EXAMINATION:  VITAL SIGNS:  Blood pressure 146/94, pulse 74,  respiratory rate 18. She is saturating 100% on room air.  GENERAL:  The patient is pleasant in no acute distress. She is alert and  oriented x3. Affect is bright and appropriate. The patient has some  decreased balance to the left side with gait. Cranial nerve exam reveals  decreased sensation over the left face and into the jaw. She has left facial  droop which is actually improved. Lid closure is improved significantly with  the weight. She has some mild swelling around the eyelid now. No  discoloration of the face was seen. Cognitively, she is intact.  HEART:  Regular.  CHEST:  Clear.  ABDOMEN:  Soft, nontender.   ASSESSMENT:  Left sided vestibular Schwannoma status post resection with  residual facial nerve injury and concomitant sensory/pain syndrome. The  patient likely has trigeminal nerve involvement at this point.   PLAN:  1. I increased her Duragesic patch to 75 mcg q.72h.  2. Continue Vicodin 5/500 one q.6h. p.r.n.  3. Transdermal analgesic cream.  4. Will consider trigeminal nerve block depending on  response to increased      Duragesic.  5. I will see the patient back in about a month's time.      Ranelle Oyster, M.D.  Electronically Signed     ZTS/MedQ  D:  10/11/2005 16:12:11  T:  10/12/2005 11:08:38  Job #:  [id number]   cc:   Titus Dubin. Alwyn Ren, MD,FACP,FCCP  [phone] [place]  [place] [address]

## 2010-07-03 NOTE — Assessment & Plan Note (Signed)
[name] is back regarding her left facial nerve palsy and pain syndrome.  Things have been about the same since I saw her in March.  She is still  having 8/10 pain.  She was unable to tolerate the Cymbalta.  Duragesic patch  remains at 25 mcg q.72h.  She saw her neurosurgeon back for a yearly follow  up, and he had nothing new to recommend at this point.  A TENS unit helps  minimally.  She still has difficulty with eating and drinking due to  weakness.  She has ongoing difficulty with eye closure and brow support,  left.  Face is tender with touch, and often is associated with increased  anxiety levels.  The patient says her pain is constant tingling and burning.  It interferes with general activity, relationships with others and enjoyment  of life on a moderate to severe level.  Sleep is fair.  Ice and heat  sometimes help with pain.   REVIEW OF SYSTEMS:  The patient reports numbness and difficulty walking and  occasional dizziness.  Does have loss of taste.  Constipation is a problem,  as well as some weight gain and limb swelling.   SOCIAL HISTORY:  The patient is married, and no new changes are noted today.   PHYSICAL EXAMINATION:  VITAL SIGNS:  Blood pressure is 159/111, pulse 76,  respiratory rate 16.  She is satting 100% on room air.  GENERAL:  The patient is pleasant, in no acute distress.  She is alert and  oriented x3.  She is a bit anxious, but generally appropriate.  NEUROLOGIC:  Coordination was fair, except for some mild decrease in gait  with normal ambulation.  Reflexes were 2+.  Facial sensation remains normal  on the right.  She still has some hypersensitivity on the left, but not  frank allodynia.  She has difficulty with eye closure, although this is  overall improved from initial.  There is some brow lag on the left.  She has  a left facial droop.   ASSESSMENT:  Left-sided vestibular schwannoma, status post resection, with  residual facial nerve injury and  additional sensory/pain component to her  syndrome.  She appears to have trigeminal nerve involvement.   PLAN:  1.  Will increase Duragesic to 50 mcg q.72h.  2.  Will try Ultram for breakthrough pain to see if this is any more      beneficial than Vicodin.  3.  I gave her a trial of transdermal cream consisting of baclofen 2%,      Elavil 2%, and 20% ketoprofen.  She was advised to be very careful about      getting this in the eyes.  4.  Consider trigeminal nerve block.  5.  Will see the patient back in about 1 months time.      Ranelle Oyster, M.D.  Electronically Signed     ZTS/MedQ  D:  07/09/2005 12:09:38  T:  07/10/2005 10:52:53  Job #:  [id number]   cc:   Titus Dubin. Alwyn Ren, M.D. [facility]  [phone] [place]  [place] [address]

## 2010-07-03 NOTE — H&P (Signed)
NAME:  [name], [name]                       ACCOUNT NO.:  [id number]   MEDICAL RECORD NO.:  [id number]                   PATIENT TYPE:  INP   LOCATION:  4732                                 FACILITY:  [facility]   PHYSICIAN:  Jesse Sans. Wall, M.D.                DATE OF BIRTH:  11-Jan-1946   DATE OF ADMISSION:  08/14/2003  DATE OF DISCHARGE:                                HISTORY & PHYSICAL   REASON FOR ADMISSION:  SVT.   PRIMARY CARE PHYSICIAN:  Titus Dubin. Alwyn Ren, M.D. [facility].  Primary cardiologist  is Maisie Fus C. Wall, M.D.  Neurosurgeon is Dr. Andrey Campanile.   HISTORY OF PRESENT ILLNESS:  This is a 65 year old female with no previous  cardiac history except hypertension, who was seen by Dr. Daleen Squibb in April of  2005 with complaint of chest pressure while walking.  A stress Cardiolite  was recommended, but not done secondary to the need for neurosurgery  secondary to a left vestibular schwannoma.  The patient today while in rehab  postoperative developed an SVT at a rate of 190 to 200 with diaphoresis  positive and shortness of breath.  She was given 6 mg of IV Adenosine with a  long pause post Adenosine with conversion to normal sinus rhythm with an  increase to sinus tachycardia.  The patient states that she has had these  episodes a few times a year for the past five or six years, usually lasting  minutes up to an hour duration.  They resolved on own as well as with p.r.n.  Inderal.  The patient's thromboembolic risk factors include hypertension, EF  is not known at this point.  The patient has never had a heart  catheterization or antiarrhythmic therapy in the past.   PAST MEDICAL HISTORY:  Positive for the left vestibular schwannoma and  hypertension.   PAST SURGICAL HISTORY:  History of patent ductus repair in 1955.  Hysterectomy, appendectomy, and back surgery as well as a left vestibular  schwannoma repair.   MEDICATIONS:  1. Cipro 500 mg b.i.d. for five days.  2. Desyrel 50 mg q.h.s.  may repeat x1.  3. Protonix 40 mg daily.  4. Triamterene/hydrochlorothiazide 75/50 one p.o. daily on hold.  5. Decadron 6 p.o. t.i.d. June 29, then 4 mg p.o. t.i.d. June 30 and July 1,     then 2 mg p.o. t.i.d. on July 2 and July 3, then 2 mg p.o. b.i.d. on July     4 and July 5, then 2 mg p.o. daily on July 6 and July 7, then     discontinue.  6. Senokot two tablets p.o. q.h.s.  7. Lovenox 40 mg subcu daily or q.1800.  8. Estradiol 1 mg daily.  9. Lacrilube ophthalmic OP q.3h.  10.      Nystatin 5 mL q.i.d. p.o.  11.      Multivitamin one p.o. daily.  12.  Vicodin/Lortab 5/500 one to two tablets p.o. q.4-6h p.r.n.  13.      Lopressor 25 mg p.o. b.i.d.  14.      Artificial Tears OU one drop q.6h.   SOCIAL HISTORY:  The patient lives in [place] with her spouse.  She is in  [employer].  She has one son. Denies tobacco, alcohol, drugs, or herbal  therapies.  She does drink caffeine on a daily basis.  Exercise tolerance.  Exertional discomfort is positive for chest pressure while walking at times.   FAMILY HISTORY:  Father is deceased at 3 of coronary artery disease.   REVIEW OF SYSTEMS:  CONSTITUTIONAL:  Positive diaphoresis with palpitations.  HEENT:  Positive left facial droop and left eye droop secondary to surgery.  CARDIOPULMONARY:  Positive for shortness of breath with palpitations.  Denies syncope or presyncope.  Denies chest pain at that time.  Denies any  arthralgia.  GENITOURINARY:  Positive dysuria, positive hesitancy.  Positive  menopause post hysterectomy.  NEUROPSYCHE:  Positive weakness, left-sided  facial droop status post surgery.  GASTROINTESTINAL:  Positive constipation.   PHYSICAL EXAMINATION:  VITAL SIGNS:  Pulse 196, after Adenosine is now 90 to  100, respirations 22, blood pressure 100/90, post Adenosine 120/80.  GENERAL:  This is a well-developed 65 year old female with a left facial  droop status post surgery.  HEENT:  Normocephalic and atraumatic.  The  patient has a left eye patch.  NECK:  Supple, nontender, and no bruits.  HEART:  Heart rate regular rhythm, positive S1 and S2.  No murmurs, rubs, or  gallops or PMI.  LUNGS:  Clear to auscultation bilaterally.  No rub or wheeze.  ABDOMEN:  Soft, round, and nontender.  Normal active bowel sounds.  EXTREMITIES:  No cyanosis, clubbing, or edema.  MUSCULOSKELETAL:  No joint deformity, aphoses or kyphosis.  NEUROLOGY:  Awake, alert and oriented x3.  Left-sided facial droop.   EKG shows a normal sinus rhythm with a heart rate of 95, normal axis, PR  interval 126, QRS 76, QTC 455.   LABORATORY DATA:  Pending at this time as well as a two-dimensional  echocardiogram is pending.   ASSESSMENT:  1. Supraventricular tachycardia, probably AV nodal reentrant tachycardia.  2. Hypertension.  3. History of chest pressure when walking.  4. Status post surgical revision of left vestibular schwannoma.   PLAN:  Transfer to telemetry. Lopressor 25 mg b.i.d.  Check TSH.  No-  caffeine diet.  Consider ablation after recovery from surgery and EP  consult.      Chinita Pester, C.R.N.P. [facility]                 Thomas C. Wall, M.D.    DS/MEDQ  D:  08/14/2003  T:  08/14/2003  Job:  [id number]   cc:   Titus Dubin. Alwyn Ren, M.D. [facility]   Dr. Andrey Campanile, neurosurgeon

## 2010-07-03 NOTE — Consult Note (Signed)
NAME:  [name], [name]                       ACCOUNT NO.:  [id number]   MEDICAL RECORD NO.:  [id number]                   PATIENT TYPE:  INP   LOCATION:  4732                                 FACILITY:  [facility]   PHYSICIAN:  Doylene Canning. Ladona Ridgel, M.D.               DATE OF BIRTH:  1945-11-19   DATE OF CONSULTATION:  08/14/2003  DATE OF DISCHARGE:                                   CONSULTATION   REQUESTING PHYSICIAN:  Jesse Sans. Wall, M.D.   REASON FOR CONSULTATION:  Evaluation of supraventricular tachycardia in a  patient with recurrent, tachy palpitations and long post-termination pauses  and SVT.   HISTORY OF PRESENT ILLNESS:  The patient is a 65 year old woman who was  admitted to the rehabilitation portion of the hospital.  She was recently  found to have a vestibular schwannoma in her brain and underwent resection.  She underwent rehabilitation.  The patient has a history of tachy  palpitations in the past, and states that she has had these for nearly 10  years.  They typically start and stop suddenly, and are associated with  dizziness and chest pressure.  The patient has never had an episode  documented, however, until today when she experienced sudden onset of tachy  palpitations while undergoing rehabilitation.  She was found to be in a  narrow QRS tachycardia at 190 beats per minute.  This was associated with  diaphoresis.  She was given adenosine followed by a long post-termination  pause, and then restoration of sinus rhythm.  She is now referred for  additional evaluation.  She denies frank syncope with these episodes, but  does have chest pain and near syncope associated with diaphoresis.   PAST MEDICAL HISTORY:  As previously noted, there is a history of  hypertension in the past as well as appendectomy and back surgery.   SOCIAL HISTORY:  The patient is married and lives in [place].  She denies  tobacco or ethanol use.   FAMILY HISTORY:  Notable for father who died  at age 11 of coronary disease.   REVIEW OF SYSTEMS:  Notable for no fevers or chills.  She does have  occasional sweats particularly with SVT.  She denies headache.  She has a  patch over her left eye.  She denies chest pain.  She does have orthopnea  and PND, but denies syncope or claudication.  She does have shortness of  breath and palpitations as previously noted.  She denies arthralgias or  arthritis.  She denies dysuria, hematuria.  She does have dysuria, but  denies hematuria or nocturia.  She has generalized weakness, but denies  numbness or depression. She denies nausea, vomiting or diarrhea, but does  have occasional constipation. She denies polyuria or polydipsia.   PHYSICAL EXAMINATION:  GENERAL:  She is a pleasant, middle-aged woman with  an eye patch on her left eye and a facial droop on the left.  VITAL SIGNS:  Blood pressure was 120/70.  Pulse was 78 and regular.  Respirations are 18.  HEENT:  Normocephalic, atraumatic.  There was an eye patch over the left  eye.  There is a left facial droop.  Oropharynx is moist.  The neck revealed  no jugular venous distension.  LUNGS:  Clear bilaterally to auscultation.  CARDIOVASCULAR:  Regular rate and rhythm.  NECK:  Revealed no thyromegaly.  Trachea was midline.  CARDIOVASCULAR:  Exam had a regular rate and rhythm with normal S1 and S2.  ABDOMEN:  Exam was soft, nontender, nondistended.  There was no  organomegaly.  EXTREMITIES:  Demonstrated no cyanosis, clubbing or edema.  PULSES:  The pulses were 2+ and symmetric.  NEUROLOGIC:  Exam demonstrated a left facial drop.   EKG:  SVT demonstrates a narrow QRS tachycardia at a rate of 190 beats per  minute.  EKG during sinus rhythm demonstrates sinus rhythm with no  ventricular preexcitation.   IMPRESSION:  1. Recurrent supraventricular tachycardia.  2. History of hypertension.  3. Status post resection of a left vestibular schwannoma.   RECOMMENDATIONS:  1. To observe the  patient.  2. We will check a TSH.  3. I have discussed the treatment options with regard to catheter ablation.  4. The risks, benefits, goals and expectations of this procedure have been     discussed with the patient and her husband. We will plan to proceed with     this on August 15, 2003.  5. Alternatively, I discussed the possibility of medical therapy, and a     period of watchful waiting.  However, the patient would like to proceed     with ablation.                                               Doylene Canning. Ladona Ridgel, M.D.    GWT/MEDQ  D:  08/14/2003  T:  08/14/2003  Job:  [id number]   cc:   Andrey Campanile, Dr.  Neurosurgery   Titus Dubin. Alwyn Ren, M.D. [facility]   Maisie Fus C. Wall, M.D.

## 2010-07-03 NOTE — Assessment & Plan Note (Signed)
[name] is back regarding her facial pain.  She likes the methadone and  only is using 5 mg twice a day.  It keeps her pain more at an even  keel.  She really experiences no ill-effects, except for perhaps a bit  of swelling.  She rates her pain a 3 to 6 out of 10.  The pain is  burning, aching and constant.  The pain interferes with general  activity, relation with others, enjoyment of life on a moderate to  severe level.  Sleep is good.   REVIEW OF SYSTEMS:  The patient reports numbness, tingling, dizziness,  loss of taste.  Other pertinent positives are above and full review is  in the written health and history section.   SOCIAL HISTORY:  The patient is married and without significant change.   PHYSICAL EXAMINATION:  Blood pressure 162/85.  Pulse 65.  Respiratory  rate 18.  She is satting 97% on room air.  The patient is pleasant,  alert and oriented x3.  She has some mild left facial nerve palsy still.  Speech is articulate.  She has some facial sensitivity still in the left  third division of the trigeminal nerve as well.  HEART:  Regular rate and rhythm.  LUNGS:  Clear.  ABDOMEN:  Soft and nontender.   ASSESSMENT:  1. Left-sided vestibular schwannoma with perioperative left-sided      facial nerve injury.  2. Left-sided trigeminal neuralgia.   PLAN:  1. Continue methadone at 5 mg b.i.d.  She can increase the t.i.d. as      she chooses and needs to to cover pain at night a bit better.  2. Continue Vicodin for breakthrough pain.  3. Tofranil PM for sleep and nerve irritation.  4. I will see her back in about 3 months' time.      Ranelle Oyster, M.D.  Electronically Signed     ZTS/MedQ  D:  02/17/2007 10:36:04  T:  02/17/2007 11:20:45  Job #:  [id number]   cc:   Thomas C. Wall, MD, FACC  [address]  [address]  [place]  [place] [address]   Titus Dubin. Alwyn Ren, MD,FACP,FCCP  [phone] [place]  [place] [address]

## 2010-07-03 NOTE — Assessment & Plan Note (Signed)
HISTORY OF PRESENT ILLNESS:  [name] is back regarding her left sided facial  pain related to her facial nerve palsy. She has been unable to tolerate any  of the neuropathic medicines that we have initiated. She has come off the  Lyrica and the Cymbalta at this point. The only thing that she finds helpful  and tolerable is the Vicodin, which she is taking twice a day. She tries to  stretch it out as long as possible between doses. She is having problems  sleeping. Her eye and mouth are constantly dry. She rates her average pain a  9 out of 10. The pain is sharp, burning, and constant. The pain is worse in  the evening. It improves somewhat with heat, ice and medications as  mentioned above. She still is receiving surgical input regarding the nerve.  Some question of whether she will need more nerve studies at some point. She  is discussing with ophthalmology regarding lid surgery to help with lid  closure.   CURRENT MEDICATIONS:  Vicodin as mentioned above, 5/500 1 p.o. q. 12 p.r.n.   REVIEW OF SYSTEMS:  The patient reports weakness, tingling, numbness,  troubles with walking and dizziness still related to her vestibular  schwannoma.  She also has had limb swelling and appetite problems.   SOCIAL HISTORY:  The patient is married. Without significant change. She  would like to get back to work.   PHYSICAL EXAMINATION:  VITAL SIGNS:  Blood pressure 141/60, pulse 65,  respiratory rate 16. She is sating 99% on room air.  GENERAL:  The patient is pleasant and in mild distress.  HEENT:  Lid closure is unchanged. She has left facial droop as well. Left  tongue deviation is noted also.  NEUROLOGIC:  She is alert and appropriate for the most part. Gait is  generally stable from last visit. She has some mild difficulties with fine  motor coordination. Area on the left face remains hyper-sensitive to touch.  Sclerae is white and anicteric. The patient had stable strength at 4+ to 5  out of 5 on  the left side and 5 out of 5 on the right side. Sensory  examination was normal.   ASSESSMENT:  1.  Left facial pain due to facial nerve injury.  2.  Status post left sided vestibular schwannoma resection.   PLAN:  1.  Will initiate Ultram, extended release, 100 mg q.d. I have her 8 days of      samples today.  2.  She may also stay with Vicodin 5/500 for breakthrough pain.  3.  Would like to introduce another anticonvulsant at some point, perhaps at      a low dose.  4.  Await surgical plan.  5.  I will see the patient back in 1 months time.       ZTS/MedQ  D:  07/31/2004 11:43:33  T:  07/31/2004 22:30:55  Job #:  [id number]

## 2010-07-03 NOTE — Assessment & Plan Note (Signed)
MEDICAL RECORD NUMBER:  [id number]   [name] is back regarding her left facial nerve palsy and pain syndrome. She  states that the Duragesic patch at 25 mcg helped initially but the effects  seem to have worn off to a certain extent. She is still requiring two  Vicodin a day for pain in the morning when she awakes, as well as later in  the day. She rates her pain at a 9/10. She notes some drawing up of her  face, particularly at the left jaw. She has pain when she talks or chews  more. She did not go through with her eyelid surgery as she seemed to get  more lid closure return. The describes her pain as burning, tingling,  aching. It interferes with general activity, relations with others, and  enjoyment of life on a severe level. Sleep is good. She denies any new  issues today.   REVIEW OF SYSTEMS:  The patient reports some problems with balance still, as  well as pertinent positives as above. She also notes some urinary retention  and occasional limb swelling. Full review of systems is in the health and  history section of the chart.   SOCIAL HISTORY:  The patient is married and would like to get back to work  but realizes that this may not be an option.   PHYSICAL EXAMINATION:  Blood pressure is 151/63, pulse is 88, respiratory  rate 16, she is saturating 100% in room air. The patient is pleasant, no  acute distress. She is alert and oriented x3. Heart is regular rhythm, lungs  are clear, abdomen soft and nontender. On facial examination she has much  improved symmetry on the left side. She loses some symmetry during facial  expressions and with her lid closure, but in general there is some  improvement. She was able to close her left eye with extra time almost  completely. She had a reflexive lift of the left cheek when she closed her  eye today. She was also able to voluntarily move it as well. Her facial tone  in general appeared to be more symmetrical today. The area remains  hypersensitive to touch. Strength is intact throughout the right side. She  still has a trace weakness in the left with decreased coordination and  sensation.   ASSESSMENT:  1.  Status post left facial nerve injury with left facial nerve palsy and      subsequent pain syndrome.  2.  History of left-sided vestibular schwannoma resection.   PLAN:  1.  Will attempt an increase of the Duragesic patch to 50 mcg q.72h.  2.  Consider TENS unit trial if insurance will approve.  3.  Continue Vicodin 5/500 for breakthrough pain.  4.  I gave the patient some samples of Skelaxin as she may be having some      fatigue spasm on the left side of the face. She will see how this helps      some of her symptomatology.  5.  I will see the patient back in about a month's time for follow-up.      Ranelle Oyster, M.D.  Electronically Signed     ZTS/MedQ  D:  12/07/2004 10:43:29  T:  12/07/2004 13:34:51  Job #:  [id number]

## 2010-07-21 ENCOUNTER — Encounter: Payer: Medicare Other | Attending: Neurosurgery | Admitting: Neurosurgery

## 2010-07-21 DIAGNOSIS — M542 Cervicalgia: Secondary | ICD-10-CM

## 2010-07-21 DIAGNOSIS — R51 Headache: Secondary | ICD-10-CM | POA: Insufficient documentation

## 2010-07-21 DIAGNOSIS — G894 Chronic pain syndrome: Secondary | ICD-10-CM

## 2010-07-21 DIAGNOSIS — R42 Dizziness and giddiness: Secondary | ICD-10-CM | POA: Insufficient documentation

## 2010-07-21 DIAGNOSIS — G500 Trigeminal neuralgia: Secondary | ICD-10-CM | POA: Insufficient documentation

## 2010-07-21 DIAGNOSIS — Z9889 Other specified postprocedural states: Secondary | ICD-10-CM | POA: Insufficient documentation

## 2010-07-21 DIAGNOSIS — R5381 Other malaise: Secondary | ICD-10-CM | POA: Insufficient documentation

## 2010-07-21 DIAGNOSIS — R5383 Other fatigue: Secondary | ICD-10-CM | POA: Insufficient documentation
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
## 2010-08-11 ENCOUNTER — Other Ambulatory Visit: Payer: Self-pay | Admitting: Family Medicine

## 2010-08-11 DIAGNOSIS — Z1231 Encounter for screening mammogram for malignant neoplasm of breast: Secondary | ICD-10-CM

## 2010-08-17 ENCOUNTER — Encounter: Payer: Medicare Other | Attending: Neurosurgery | Admitting: Neurosurgery

## 2010-08-17 DIAGNOSIS — G500 Trigeminal neuralgia: Secondary | ICD-10-CM | POA: Insufficient documentation

## 2010-08-17 DIAGNOSIS — M542 Cervicalgia: Secondary | ICD-10-CM | POA: Insufficient documentation

## 2010-08-17 DIAGNOSIS — M25519 Pain in unspecified shoulder: Secondary | ICD-10-CM | POA: Insufficient documentation

## 2010-08-17 DIAGNOSIS — R42 Dizziness and giddiness: Secondary | ICD-10-CM | POA: Insufficient documentation

## 2010-08-17 DIAGNOSIS — R5381 Other malaise: Secondary | ICD-10-CM | POA: Insufficient documentation

## 2010-08-18 ENCOUNTER — Ambulatory Visit: Payer: Medicare Other
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
## 2010-08-28 ENCOUNTER — Ambulatory Visit: Payer: Medicare Other

## 2010-09-15 ENCOUNTER — Encounter: Payer: Medicare Other | Attending: Neurosurgery | Admitting: Neurosurgery

## 2010-09-15 DIAGNOSIS — R5381 Other malaise: Secondary | ICD-10-CM | POA: Insufficient documentation

## 2010-09-15 DIAGNOSIS — G8929 Other chronic pain: Secondary | ICD-10-CM | POA: Insufficient documentation

## 2010-09-15 DIAGNOSIS — T889XXS Complication of surgical and medical care, unspecified, sequela: Secondary | ICD-10-CM | POA: Insufficient documentation

## 2010-09-15 DIAGNOSIS — M25519 Pain in unspecified shoulder: Secondary | ICD-10-CM | POA: Insufficient documentation

## 2010-09-15 DIAGNOSIS — M542 Cervicalgia: Secondary | ICD-10-CM | POA: Insufficient documentation

## 2010-09-15 DIAGNOSIS — R5383 Other fatigue: Secondary | ICD-10-CM | POA: Insufficient documentation

## 2010-09-15 DIAGNOSIS — R42 Dizziness and giddiness: Secondary | ICD-10-CM | POA: Insufficient documentation

## 2010-09-15 DIAGNOSIS — G500 Trigeminal neuralgia: Secondary | ICD-10-CM | POA: Insufficient documentation

## 2010-09-15 NOTE — Assessment & Plan Note (Signed)
[name] was followed by Dr. Riley Kill for history of left vestibular schwannoma that resulted in trigeminal neuralgia.  She does have some mild palsy on the left side of her face.  She also has complaints of neck pain and shoulder pain.  She reports her pain today as 3-5.  It is a sharp burning pain with tingling.  General activity level is 8-10. Pain is worse during the day and evening.  Sleep patterns are good. Pain is worse with talking.  She gets fair relief with the medicine. She walks without assistance.  She climbs steps and drives functionally. She is unemployed.  REVIEW OF SYSTEMS:  Notable for difficulties described above as well as numbness, weakness, no signs of aberrant behavior or suicidal thoughts.  PAST MEDICAL HISTORY:  Unchanged.  SOCIAL HISTORY:  Married, lives with husband and family.  FAMILY HISTORY:  Unchanged.  PHYSICAL EXAMINATION:  Her blood pressure is 128/62, pulse 55, respirations 14, O2 sats 98 on room air.  Motor strength, sensation is good.  Facial movements are not symmetrical.  Again she is somewhat palsied on the left side of her face.  She does complain of some tenderness in her face.  She feels like her words slur is getting worse. She is going to follow up with Dr. Jillyn Hidden cram.  She states that her next MRI of the stem and brain will be in a year but we talked about obtaining MRI of her cervical spine and she is going to let me know when she gets __________ next month if she wants to proceed with that.  ASSESSMENT: 1. Resection of left vestibular schwannoma at [facility] 7 years     ago resulting in trigeminal neuralgia. 2. Dizziness and fatigue. 3. Cervicalgia. 4. Paraspinous chronic pain in the cervical spine.  PLAN: 1. She has Nucynta at home.  She did not need to refill today.  She     still has methadone prescription from last time.  Her pills counts     were correct.  She did not receive a prescription today. 2. I did suggest  that we do trigger point injections and after consent     was obtained, we alcohol prepped the right side of the paraspinous     muscle, suprascapular, and injected 1 mL of lidocaine in 3     different locations.  She had a total of 3 mL, tolerated well.     There is no bleeding.  Hopefully she will get some relief with     this.  We will see her back in 1 month to see how she has done.     Her questions were encouraged and answered.     [name] L. Blima Dessert Electronically Signed    RLW/MedQ D:  09/15/2010 10:06:12  T:  09/15/2010 20:27:49  Job #:  [id number]

## 2010-10-13 ENCOUNTER — Encounter: Payer: Medicare Other | Attending: Neurosurgery | Admitting: Neurosurgery

## 2010-10-13 DIAGNOSIS — M542 Cervicalgia: Secondary | ICD-10-CM | POA: Insufficient documentation

## 2010-10-13 DIAGNOSIS — G500 Trigeminal neuralgia: Secondary | ICD-10-CM

## 2010-10-13 DIAGNOSIS — R5383 Other fatigue: Secondary | ICD-10-CM | POA: Insufficient documentation

## 2010-10-13 DIAGNOSIS — R5381 Other malaise: Secondary | ICD-10-CM | POA: Insufficient documentation

## 2010-10-13 DIAGNOSIS — G8929 Other chronic pain: Secondary | ICD-10-CM | POA: Insufficient documentation

## 2010-10-13 DIAGNOSIS — R42 Dizziness and giddiness: Secondary | ICD-10-CM | POA: Insufficient documentation

## 2010-10-13 NOTE — Assessment & Plan Note (Signed)
ACCOUNT:  [id number].  This patient is followed by Dr. Riley Kill for history of left vestibular schwannoma that resulting trigeminal neuralgia.  She reports no pain increased on that side of her face.  The last time she was here, she had some interscapular pain though I did trigger point injection alone and she states that it helped a lot.  Today, she rates her pain at 5-7.  It is a burning, stabbing pain that is tingling, it is constant.  General activity level is 7-10.  The pain is worse during the day and the evening.  Pain is worse with a lot of talking or mouth movement.  Heat, ice, medication tend to help.  She walks without assistance.  She does drive and climb steps.  She is retired.  She needs help with household duties and shopping from time-to-time.  REVIEW OF SYSTEMS:  Notable for those difficulties as well as some constipation, limb swelling, anxiety, loss of taste or smell, trouble with ambulation, dizziness, numbness.  No suicidal thoughts or aberrant behaviors.  SOCIAL HISTORY:  She is married.  Lives with her husband.  PAST MEDICAL HISTORY:  Unchanged.  FAMILY HISTORY:  Unchanged.  PHYSICAL EXAMINATION:  VITAL SIGNS:  Blood pressure is 114/46, pulse 65, respiration 18, O2 sats 96 on room air. NEUROLOGIC:  Constitutionally, she is within normal limits.  Her affect is bright and alert.  Her speech is clear.  Gait is normal.  Strength and sensation are okay.  ASSESSMENT: 1. History of resection of left vestibular schwannoma, resulting     trigeminal neuralgia. 2. Complaints of dizziness, fatigue. 3. Cervicalgia. 4. History of paraspinous chronic pain, cervical spine.  PLAN: 1. Refill Nucynta 50 mg one p.o. q.12 h. p.r.n., #30 with no refill. 2. Methadone 10 mg one half to one p.o. b.i.d., #75 with no refill. 3. She will follow up in the clinic in 1 month.  Her questions were     encouraged and answered.     Giavanni Odonovan L. Blima Dessert Electronically  Signed    RLW/MedQ D:  10/13/2010 11:06:53  T:  10/13/2010 15:03:19  Job #:  [id number]

## 2010-11-10 ENCOUNTER — Encounter: Payer: Medicare Other | Attending: Neurosurgery | Admitting: Neurosurgery

## 2010-11-10 DIAGNOSIS — R42 Dizziness and giddiness: Secondary | ICD-10-CM | POA: Insufficient documentation

## 2010-11-10 DIAGNOSIS — M542 Cervicalgia: Secondary | ICD-10-CM | POA: Insufficient documentation

## 2010-11-10 DIAGNOSIS — D333 Benign neoplasm of cranial nerves: Secondary | ICD-10-CM | POA: Insufficient documentation

## 2010-11-10 DIAGNOSIS — G519 Disorder of facial nerve, unspecified: Secondary | ICD-10-CM

## 2010-11-10 NOTE — Assessment & Plan Note (Signed)
ACCOUNT:  [id number].  This patient of Dr. Riley Kill who is seen for history of vestibular schwannoma resulting trigeminal neuralgia.  She has some paralysis on the left side of her face that is unchanged.  She states that her pain is unchanged at 5-6.  It is a burning, stabbing, and tingling pain. General activity level is a 8-10.  Pain is worse in morning at daytime. Sleep patterns are good.  Pain is worse with talking. Mostly rest, heat, and medication tend to help.  She walks without assistance.  She drives. Functionally, she is on disability.  She needs help with household duties and shopping.  REVIEW OF SYSTEMS:  Notable for difficulties as described above as well as some weight gain, constipation, urinary retention, limb swelling, anxiety, loss of taste or smell.  No suicidal thoughts or aberrant behaviors.  She has some trouble with walking, dizziness, and paresthesias.  PAST MEDICAL HISTORY, SOCIAL HISTORY AND FAMILY HISTORY:  Unchanged.  PHYSICAL EXAMINATION:  Blood pressure is 125/92, pulse 75, respirations 16, O2 sats 98% on room air.  Motor strength is good in upper and lower extremities.  She still does have the droop on the left side of her face resulting paralysis.  Constitutionally, she is within normal limits. She is alert and orient x3.  Her affect is bright.  She does state that she feels like she needs to go back and see Dr. Wynetta Emery. Even noticed that the tumor was recurrent and slow-growing, she feels like there is "something wrong," so I have encouraged to call our office to make that appointment.  ASSESSMENT: 1. History of resection of vestibular schwannoma resulting trigeminal     neuralgia. 2. Complaints of dizziness. 3. Cervicalgia. 4. Paraspinous chronic pain in cervical spine.  PLAN: 1. Refill Nucynta 50 mg 1 p.o. q.12 h p.r.n. 30 with no refill. 2. Methadone 10 mg 1/2 to 1 p.o. b.i.d., 75 with no refill.  She was     given two prescriptions of each.  Dr.  Riley Kill is okay for her return     to clinic every 2 months.  Questions were encouraged and answered.     We will see her back here in 2 months.     [name] Electronically Signed    RLW/MedQ D:  11/10/2010 11:19:39  T:  11/10/2010 12:02:55  Job #:  [id number]

## 2010-12-08 ENCOUNTER — Encounter: Payer: Medicare Other | Admitting: Neurosurgery

## 2010-12-31 ENCOUNTER — Encounter: Payer: Medicare Other | Attending: Neurosurgery | Admitting: Neurosurgery

## 2010-12-31 DIAGNOSIS — M7989 Other specified soft tissue disorders: Secondary | ICD-10-CM | POA: Insufficient documentation

## 2010-12-31 DIAGNOSIS — R5383 Other fatigue: Secondary | ICD-10-CM | POA: Insufficient documentation

## 2010-12-31 DIAGNOSIS — G894 Chronic pain syndrome: Secondary | ICD-10-CM

## 2010-12-31 DIAGNOSIS — R439 Unspecified disturbances of smell and taste: Secondary | ICD-10-CM | POA: Insufficient documentation

## 2010-12-31 DIAGNOSIS — M542 Cervicalgia: Secondary | ICD-10-CM | POA: Insufficient documentation

## 2010-12-31 DIAGNOSIS — R279 Unspecified lack of coordination: Secondary | ICD-10-CM | POA: Insufficient documentation

## 2010-12-31 DIAGNOSIS — F411 Generalized anxiety disorder: Secondary | ICD-10-CM | POA: Insufficient documentation

## 2010-12-31 DIAGNOSIS — G500 Trigeminal neuralgia: Secondary | ICD-10-CM | POA: Insufficient documentation

## 2010-12-31 DIAGNOSIS — R5381 Other malaise: Secondary | ICD-10-CM | POA: Insufficient documentation

## 2010-12-31 DIAGNOSIS — K5900 Constipation, unspecified: Secondary | ICD-10-CM | POA: Insufficient documentation

## 2010-12-31 DIAGNOSIS — R339 Retention of urine, unspecified: Secondary | ICD-10-CM | POA: Insufficient documentation

## 2010-12-31 DIAGNOSIS — R209 Unspecified disturbances of skin sensation: Secondary | ICD-10-CM | POA: Insufficient documentation

## 2010-12-31 DIAGNOSIS — R61 Generalized hyperhidrosis: Secondary | ICD-10-CM | POA: Insufficient documentation

## 2010-12-31 DIAGNOSIS — G8929 Other chronic pain: Secondary | ICD-10-CM | POA: Insufficient documentation

## 2010-12-31 DIAGNOSIS — R635 Abnormal weight gain: Secondary | ICD-10-CM | POA: Insufficient documentation

## 2010-12-31 DIAGNOSIS — R42 Dizziness and giddiness: Secondary | ICD-10-CM | POA: Insufficient documentation

## 2010-12-31 NOTE — Assessment & Plan Note (Signed)
The patient of Dr. Riley Kill seen for chronic pain and trigeminal neuralgia status post vestibular schwannoma.  She also has some cervicalgia.  She reports no change in her pain.  Currently it is a 5-6.  It is a burning, stabbing, tingling type pain.  General activity level is 7-10.  Pain is worse in the morning and during the day.  Sleep patterns are good.  Pain is worse with talking.  Heat and medication tend to help.  She walks without assistance.  She climbs steps and drives.  Functionally she is independent.  She needs some help with household duties and shopping.  REVIEW OF SYSTEMS:  Notable for difficulties described above as well as some night sweats, weight gain, constipation, urinary retention, limb swelling, loss of taste and smell, weakness, numbness, trouble walking, dizziness, anxiety.  No suicidal thoughts or aberrant behaviors.  Pill counts unless she is consistent.  PAST MEDICAL HISTORY/SOCIAL HISTORY/FAMILY HISTORY:  Unchanged.  PHYSICAL EXAMINATION:  Blood pressure 144/68, pulse 60, respirations 16, O2 sats 97 on room air.  Sensation intact in her upper and lower extremities, somewhat diminished in the face.  Constitutionally, she is within normal limits.  She is alert and oriented x3.  ASSESSMENT: 1. History of resection of vestibular schwannoma resulting in     trigeminal neuralgia. 2. Dizziness/question of vertigo. 3. History of cervicalgia. 4. Paraspinous chronic pain.  PLAN: 1. She did receive her prescription for methadone 1/2 to 1 p.o.     b.i.d., #75, no refill 2. Nucynta 50 mg 1 p.o. q.12 hours p.r.n., #30 with no refill. 3. Her questions were encouraged and answered.  We will see her back     here in clinic in 2 months.     Noelia Lenart L. Blima Dessert Electronically Signed    RLW/MedQ D:  12/31/2010 11:19:15  T:  12/31/2010 20:10:34  Job #:  [id number]

## 2011-03-02 ENCOUNTER — Encounter: Payer: Medicare Other | Attending: Neurosurgery | Admitting: Neurosurgery

## 2011-03-02 DIAGNOSIS — M542 Cervicalgia: Secondary | ICD-10-CM | POA: Diagnosis not present

## 2011-03-02 DIAGNOSIS — G894 Chronic pain syndrome: Secondary | ICD-10-CM

## 2011-03-02 DIAGNOSIS — G500 Trigeminal neuralgia: Secondary | ICD-10-CM | POA: Diagnosis not present

## 2011-03-02 DIAGNOSIS — G8929 Other chronic pain: Secondary | ICD-10-CM | POA: Insufficient documentation

## 2011-03-03 NOTE — Assessment & Plan Note (Signed)
This patient of Dr. Riley Kill seen for chronic pain and trigeminal neuralgia, status post a vestibular schwannoma.  The patient reports no change in her pain at a 6, burning, stabbing and tingling.  Pain general activity level is 8-10.  The pain is worse in the morning of the day. Sleep patterns are fair.  Pain is worse with talking.  Heat and medication helped.  She walks without assistance.  She climb steps and drives.  Functionally, she needs some help with household duties.  REVIEW OF SYSTEMS:  Notable for the difficulties described above as well as some weight gain, constipation, urinary retention, limb swelling as well as the taste or smell, depression, anxiety, trouble walking, paresthesias, no suicidal thoughts or aberrant behaviors.  Last pill count and UDS are consistent.  PAST MEDICAL HISTORY, SOCIAL HISTORY AND FAMILY HISTORY:  Unchanged.  PHYSICAL EXAMINATION:  VITAL SIGNS:  Blood pressure is 116/51, pulse 60, respirations 18 and O2 sats 99 on room air.  EXTREMITIES:  Motor strength and sensation are intact. CONSTITUTIONALLY:  She is within normal limits.  She is alert and oriented x3.  She has normal gait.  ASSESSMENT: 1. History of vestibular schwannoma resulting in trigeminal neuralgia. 2. History of cervicalgia 3. Paraspinous chronic pain.  PLAN: 1. I refilled methadone 10 mg 1.5-1 p.o. b.i.d., 75 with no refill 2. Nucynta 50 mg 1 p.o. q.12 h. p.r.n. 30 with no refill.  His     questions were encouraged and answered.  She will follow up in 2     months.     Victory Strollo L. Blima Dessert Electronically Signed    RLW/MedQ D:  03/02/2011 10:01:47  T:  03/03/2011 01:55:43  Job #:  [id number]

## 2011-04-27 ENCOUNTER — Ambulatory Visit: Payer: Medicare Other | Admitting: Physical Medicine & Rehabilitation

## 2011-05-10 ENCOUNTER — Encounter: Payer: Self-pay | Admitting: Physical Medicine & Rehabilitation

## 2011-05-10 ENCOUNTER — Encounter: Payer: Medicare Other | Attending: Neurosurgery | Admitting: Physical Medicine & Rehabilitation

## 2011-05-10 VITALS — BP 142/75 | HR 62 | Resp 14 | Ht 63.0 in | Wt 165.0 lb

## 2011-05-10 DIAGNOSIS — D333 Benign neoplasm of cranial nerves: Secondary | ICD-10-CM | POA: Insufficient documentation

## 2011-05-10 DIAGNOSIS — S0450XA Injury of facial nerve, unspecified side, initial encounter: Secondary | ICD-10-CM | POA: Insufficient documentation

## 2011-05-10 DIAGNOSIS — S0452XA Injury of facial nerve, left side, initial encounter: Secondary | ICD-10-CM | POA: Insufficient documentation

## 2011-05-10 DIAGNOSIS — G500 Trigeminal neuralgia: Secondary | ICD-10-CM | POA: Diagnosis not present

## 2011-05-10 MED ORDER — TAPENTADOL HCL 50 MG PO TABS: 50.0000 mg | ORAL_TABLET | Freq: Two times a day (BID) | ORAL | Status: DC | PRN

## 2011-05-10 MED ORDER — METHADONE HCL 10 MG PO TABS: 5.0000 mg | ORAL_TABLET | Freq: Two times a day (BID) | ORAL | Status: DC

## 2011-05-10 NOTE — Progress Notes (Signed)
Subjective:    Patient ID: [name], female    DOB: 1945-12-02, 66 y.o.   MRN: [id number]  HPI  [name] is back regarding her facial pain and weakness. She feels that her vision and facial weakness has worsened over the last few months.  She has a follow up scan scheduled this spring. She admittedly has increased depression and anxiety. She has returned to part time work in [employer] to help get her out of the house. She typically works 7 hour days.  She notes her hearing may be a little worse as well.    She feels that her balance may have deteriorated a bit. She drives to work and there haven't been any problems.   She puts drops in her eye which helps to keep it moistened.   Pain worsens from lunchtime to bedtime daily.  Uses the methadone in the AM, advils during the mid day, and ice at night. She'll use the nucynta at nightime only because it "knocks her out", but it does really help the pain.    Pain Inventory Average Pain 7 Pain Right Now 5 My pain is sharp, burning, stabbing and tingling  In the last 24 hours, has pain interfered with the following? General activity 9 Relation with others 9 Enjoyment of life 10 What TIME of day is your pain at its worst? daytime Sleep (in general) Good  Pain is worse with: Talking Pain improves with: rest, heat/ice and medication Relief from Meds: 8  Mobility walk without assistance ability to climb steps?  yes do you drive?  yes  Function retired I need assistance with the following:  household duties and shopping  Neuro/Psych weakness numbness trouble walking dizziness anxiety loss of taste or smell  Prior Studies Any changes since last visit?  no  Physicians involved in your care Any changes since last visit?  no        Review of Systems  HENT: Negative.   Eyes: Negative.   Respiratory: Negative.   Cardiovascular: Negative.   Gastrointestinal: Negative.   Musculoskeletal: Negative.   Skin: Negative.    Neurological: Positive for dizziness, weakness and numbness.  Hematological: Negative.   Psychiatric/Behavioral: Negative.        Objective:   Physical Exam  Constitutional: She is oriented to person, place, and time. She appears well-developed and well-nourished.  HENT:  Head: Normocephalic and atraumatic.  Eyes: EOM are normal. Pupils are equal, round, and reactive to light.  Neck: Normal range of motion.  Cardiovascular: Normal rate and regular rhythm.   Pulmonary/Chest: Effort normal and breath sounds normal.  Abdominal: Soft. Bowel sounds are normal.  Musculoskeletal: Normal range of motion.  Neurological: She is alert and oriented to person, place, and time. She has normal strength and normal reflexes. A cranial nerve deficit and sensory deficit is present.       Left peripheral 7 signs.  Decreased hearing on left.  Mild facial sensory loss on the left. Balance slightly diminished on the left. Walked heel to toe with loss of balance to the left. Sclera on left eye is irritated. Visual acuity is stable.  Skin: Skin is warm and dry.  Psychiatric: She has a normal mood and affect. Her behavior is normal. Judgment and thought content normal.          Assessment & Plan:  ASSESSMENT:  1. History of vestibular schwannoma resulting in trigeminal neuralgia and left facial nerve injury  2. History of cervicalgia  3. Chronic pain.  PLAN:  1. I refilled methadone 10 mg 1.5-1 p.o. b.i.d., 75 with no refill  2. Nucynta 50 mg 1 p.o. q.12 h. p.r.n. 30 with no refill. Here  questions were encouraged and answered. She will follow up in 2  Months. 3. She will have an MRI of her brain in 1 or 2 months per dr. Wynetta Emery. 4. I feel that a lot of these symptoms may be her perception rather than distinct progression of the tumor.  5. I did Recommend use of an eye patch at night to preven excessive drying of the eye.

## 2011-05-10 NOTE — Patient Instructions (Signed)
Try alleve one or two tablets at lunchtime. Also consider increasing your ibuprofen to 800mg  at lunchtime.  Another suggestion would be to take a 1/2 of a methadone at lunch.

## 2011-06-02 ENCOUNTER — Encounter (INDEPENDENT_AMBULATORY_CARE_PROVIDER_SITE_OTHER): Payer: Medicare Other | Admitting: Ophthalmology

## 2011-06-02 DIAGNOSIS — H43819 Vitreous degeneration, unspecified eye: Secondary | ICD-10-CM

## 2011-06-02 DIAGNOSIS — H2510 Age-related nuclear cataract, unspecified eye: Secondary | ICD-10-CM

## 2011-06-02 DIAGNOSIS — H3530 Unspecified macular degeneration: Secondary | ICD-10-CM

## 2011-07-05 ENCOUNTER — Encounter: Payer: Medicare Other | Attending: Neurosurgery | Admitting: Physical Medicine & Rehabilitation

## 2011-07-05 ENCOUNTER — Encounter: Payer: Self-pay | Admitting: Physical Medicine & Rehabilitation

## 2011-07-05 VITALS — BP 134/66 | HR 79 | Ht 62.0 in | Wt 161.0 lb

## 2011-07-05 DIAGNOSIS — G500 Trigeminal neuralgia: Secondary | ICD-10-CM | POA: Insufficient documentation

## 2011-07-05 DIAGNOSIS — S0450XA Injury of facial nerve, unspecified side, initial encounter: Secondary | ICD-10-CM | POA: Insufficient documentation

## 2011-07-05 DIAGNOSIS — D333 Benign neoplasm of cranial nerves: Secondary | ICD-10-CM | POA: Insufficient documentation

## 2011-07-05 MED ORDER — TAPENTADOL HCL 50 MG PO TABS: 50.0000 mg | ORAL_TABLET | Freq: Two times a day (BID) | ORAL | Status: DC | PRN

## 2011-07-05 MED ORDER — METHADONE HCL 10 MG PO TABS: 5.0000 mg | ORAL_TABLET | Freq: Two times a day (BID) | ORAL | Status: DC

## 2011-07-05 NOTE — Progress Notes (Signed)
  Subjective:    Patient ID: [name], female    DOB: Jul 15, 1945, 66 y.o.   MRN: [id number]  HPI  [name] is back regarding her chronic facial pain.  She saw her eye doctor who rx'ed her corneal irritation/scleral irritation.  He also found macular degeneration.  Her pain levels are stable. She still remains sensitive to touch on the left side of her face. Her medications remain fairly effective.  She has been trying to follow up with Dr. Wynetta Wong regarding her schwannoma.  She hasn't had an MRI in 2 years.   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  Pain Inventory Average Pain 7 Pain Right Now 4 My pain is burning, stabbing and tingling  In the last 24 hours, has pain interfered with the following? General activity 8 Relation with others 9 Enjoyment of life 10 What TIME of day is your pain at its worst? daytime, evening Sleep (in general) Good  Pain is worse with: some activites Pain improves with: medication Relief from Meds: 7  Mobility walk without assistance ability to climb steps?  yes do you drive?  yes Do you have any goals in this area?  no  Function employed # of hrs/week 24 I need assistance with the following:  household duties and shopping Do you have any goals in this area?  no  Neuro/Psych numbness trouble walking dizziness depression  Prior Studies Any changes since last visit?  no  Physicians involved in your care Any changes since last visit?  no       Review of Systems  Constitutional: Positive for unexpected weight change.  Gastrointestinal: Positive for constipation.  All other systems reviewed and are negative.       Objective:   Physical Exam  Constitutional: She is oriented to person, place, and time. She appears well-developed and well-nourished.  HENT:  Head: Normocephalic and atraumatic.  Eyes: Conjunctivae and EOM are normal. Pupils are equal, round, and reactive to light.   Cardiovascular: Normal rate and regular rhythm.   Pulmonary/Chest: Effort normal. No respiratory distress. She has no wheezes.  Abdominal: Bowel sounds are normal.  Musculoskeletal: Normal range of motion.  Neurological: She is alert and oriented to person, place, and time.       Left lids do not close completely and she has left facial droop. Left face remains hypersenstiive to touch.  Psychiatric: She has a normal mood and affect. Her behavior is normal. Judgment and thought content normal.          Assessment & Plan:  ASSESSMENT:  1. History of vestibular schwannoma resulting in trigeminal neuralgia and left facial nerve injury  2. History of cervicalgia  3. Chronic pain.   PLAN:  1. I refilled methadone 10 mg 1.5-1 p.o. b.i.d., 75 with no refill  2. Nucynta 50 mg 1 p.o. q.12 h. p.r.n. 30 with no refill. Here  questions were encouraged and answered.  3. I ordered an MRI of the brain with and without contrast to f/u the schwannoma  4. I still feel that a lot of these symptoms may be her perception rather than distinct progression of the tumor.  5. I did Recommend use of an eye patch at night to preven excessive drying of the eye. 6. Wrote for compounded cream of 20% ketoprofen, 5% elavil, 4% ketamine, and 10% gabapentin. 7. She'll follow up with my PA next month.

## 2011-07-17 ENCOUNTER — Ambulatory Visit
Admission: RE | Admit: 2011-07-17 | Discharge: 2011-07-17 | Disposition: A | Discharge status: Home / Self Care | Payer: Medicare Other | Source: Ambulatory Visit | Attending: Physical Medicine & Rehabilitation | Admitting: Physical Medicine & Rehabilitation

## 2011-07-17 DIAGNOSIS — G500 Trigeminal neuralgia: Secondary | ICD-10-CM

## 2011-07-17 DIAGNOSIS — D333 Benign neoplasm of cranial nerves: Secondary | ICD-10-CM

## 2011-07-17 DIAGNOSIS — S0450XA Injury of facial nerve, unspecified side, initial encounter: Secondary | ICD-10-CM

## 2011-07-17 MED ORDER — GADOBENATE DIMEGLUMINE 529 MG/ML IV SOLN
15.0000 mL | Freq: Once | INTRAVENOUS | Status: AC | PRN
  Administered 2011-07-17: 15 mL via INTRAVENOUS

## 2011-07-19 ENCOUNTER — Telehealth: Payer: Self-pay | Admitting: Physical Medicine & Rehabilitation

## 2011-07-19 NOTE — Telephone Encounter (Signed)
Please let Yalonda know that her MRI looks completely stable.  I can call her if she has any questions.

## 2011-07-20 NOTE — Telephone Encounter (Signed)
Message left on patient identified VM, informing [name] that her MRI was stable and to call us if she had any further questions.

## 2011-08-05 ENCOUNTER — Encounter: Payer: Self-pay | Admitting: Physical Medicine and Rehabilitation

## 2011-08-05 ENCOUNTER — Encounter: Payer: Medicare Other | Attending: Physical Medicine & Rehabilitation | Admitting: Physical Medicine and Rehabilitation

## 2011-08-05 VITALS — BP 123/63 | HR 64 | Resp 16 | Ht 63.0 in | Wt 163.0 lb

## 2011-08-05 DIAGNOSIS — G500 Trigeminal neuralgia: Secondary | ICD-10-CM | POA: Diagnosis not present

## 2011-08-05 DIAGNOSIS — R51 Headache: Secondary | ICD-10-CM | POA: Insufficient documentation

## 2011-08-05 DIAGNOSIS — H3530 Unspecified macular degeneration: Secondary | ICD-10-CM | POA: Insufficient documentation

## 2011-08-05 DIAGNOSIS — D333 Benign neoplasm of cranial nerves: Secondary | ICD-10-CM | POA: Insufficient documentation

## 2011-08-05 DIAGNOSIS — M542 Cervicalgia: Secondary | ICD-10-CM | POA: Insufficient documentation

## 2011-08-05 DIAGNOSIS — G8929 Other chronic pain: Secondary | ICD-10-CM | POA: Insufficient documentation

## 2011-08-05 DIAGNOSIS — S0450XA Injury of facial nerve, unspecified side, initial encounter: Secondary | ICD-10-CM

## 2011-08-05 MED ORDER — METHADONE HCL 10 MG PO TABS: 5.0000 mg | ORAL_TABLET | Freq: Two times a day (BID) | ORAL | Status: DC

## 2011-08-05 NOTE — Patient Instructions (Signed)
Continue with medication, increase your walking. Follow up with Dr. Wynetta Emery.

## 2011-08-05 NOTE — Progress Notes (Signed)
Subjective:    Patient ID: [name], female    DOB: 12/17/1945, 65 y.o.   MRN: [id number]  HPI [name] is back regarding her chronic facial pain. She saw her eye doctor who rx'ed her corneal irritation/scleral irritation. He also found macular degeneration.  Her pain levels are stable. She still remains sensitive to touch on the left side of her face. Her medications remain fairly effective.  She has been trying to follow up with Dr. Wynetta Emery regarding her schwannoma. She has had a new MRI done.    Pain Inventory Average Pain 6 Pain Right Now 5 My pain is burning, stabbing and tingling  In the last 24 hours, has pain interfered with the following? General activity 7 Relation with others 9 Enjoyment of life 10 What TIME of day is your pain at its worst? Daytime and Evening Sleep (in general) Good  Pain is worse with: talking Pain improves with: rest, heat/ice and medication Relief from Meds: 8  Mobility walk without assistance ability to climb steps?  yes do you drive?  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  no  Physicians involved in your care Any changes since last visit?  no   Family History  Problem Relation Age of Onset  . Hypertension Mother   . Heart disease Father    History   Social History  . Marital Status: Married    Spouse Name: N/A    Number of Children: N/A  . Years of Education: N/A   Social History Main Topics  . Smoking status: Never Smoker   . Smokeless tobacco: None  . Alcohol Use: None  . Drug Use: None  . Sexually Active: None   Other Topics Concern  . None   Social History Narrative  . None   Past Surgical History  Procedure Date  . Abdominal hysterectomy    Past Medical History  Diagnosis  Date  . Trigeminal neuralgia   . Abnormality of gait   . Facial nerve disorder   . Myofascial pain   . Cervicalgia   . Brachial neuritis or radiculitis NOS    BP 123/63  Pulse 64  Resp 16  Ht 5\' 3"  (1.6 m)  Wt 163 lb (73.936 kg)  BMI 28.87 kg/m2  SpO2 99%      Review of Systems  Constitutional: Positive for unexpected weight change.  HENT: Negative.   Eyes: Negative.   Respiratory: Negative.   Cardiovascular: Positive for leg swelling.  Gastrointestinal: Positive for constipation.  Genitourinary: Positive for difficulty urinating.  Musculoskeletal: Negative.   Skin: Negative.   Neurological: Positive for numbness.  Hematological: Negative.   Psychiatric/Behavioral: Negative.        Objective:   Physical Exam Constitutional: She is oriented to person, place, and time. She appears well-developed and well-nourished.  HENT:  Head: Normocephalic and atraumatic.    Cardiovascular: Normal rate and regular rhythm.  Pulmonary/Chest: Effort normal. No respiratory distress. She has no wheezes.  Abdominal: Bowel sounds are normal.  Musculoskeletal: Normal range of motion.  Neurological: She is alert and oriented to person, place, and time.  Left lids do not close completely and she has left facial droop. Left face remains hypersenstiive to touch.  Psychiatric: She has a normal mood and affect. Her behavior is  normal. Judgment and thought content normal.         Assessment & Plan:  1. History of vestibular schwannoma resulting in trigeminal neuralgia and left facial nerve injury  2. History of cervicalgia  3. Chronic pain.  PLAN:  1. I refilled methadone 10 mg 1.5-1 p.o. b.i.d., 75 with no refill  2. MRI of the brain with and without contrast to f/u the schwannoma, was stable compared to the one from 2011, the lesion was stable in size 10-19mmx3-4mm.   3. Wrote an referral to Dr. Glee Arvin the neurosurgeon, to followup on the MRI  4. discussed with patient that she should  increase her walking, to train her balance. 7. She'll follow up with  PA next month.

## 2011-08-17 ENCOUNTER — Other Ambulatory Visit: Payer: Self-pay | Admitting: Neurosurgery

## 2011-08-17 DIAGNOSIS — M4712 Other spondylosis with myelopathy, cervical region: Secondary | ICD-10-CM

## 2011-08-17 DIAGNOSIS — M4802 Spinal stenosis, cervical region: Secondary | ICD-10-CM

## 2011-08-17 DIAGNOSIS — M47812 Spondylosis without myelopathy or radiculopathy, cervical region: Secondary | ICD-10-CM

## 2011-08-23 ENCOUNTER — Encounter (INDEPENDENT_AMBULATORY_CARE_PROVIDER_SITE_OTHER): Payer: Medicare Other | Admitting: Ophthalmology

## 2011-08-23 DIAGNOSIS — H3530 Unspecified macular degeneration: Secondary | ICD-10-CM

## 2011-08-23 DIAGNOSIS — H2510 Age-related nuclear cataract, unspecified eye: Secondary | ICD-10-CM

## 2011-08-23 DIAGNOSIS — H43819 Vitreous degeneration, unspecified eye: Secondary | ICD-10-CM

## 2011-08-23 DIAGNOSIS — H5340 Unspecified visual field defects: Secondary | ICD-10-CM

## 2011-09-10 ENCOUNTER — Encounter: Payer: Medicare Other | Attending: Neurosurgery | Admitting: Physical Medicine & Rehabilitation

## 2011-09-10 ENCOUNTER — Encounter: Payer: Self-pay | Admitting: Physical Medicine & Rehabilitation

## 2011-09-10 VITALS — BP 119/60 | HR 63 | Resp 14 | Ht 63.0 in | Wt 158.0 lb

## 2011-09-10 DIAGNOSIS — S0450XA Injury of facial nerve, unspecified side, initial encounter: Secondary | ICD-10-CM | POA: Diagnosis not present

## 2011-09-10 DIAGNOSIS — G500 Trigeminal neuralgia: Secondary | ICD-10-CM | POA: Diagnosis not present

## 2011-09-10 DIAGNOSIS — D333 Benign neoplasm of cranial nerves: Secondary | ICD-10-CM | POA: Diagnosis not present

## 2011-09-10 MED ORDER — METHADONE HCL 10 MG PO TABS: 5.0000 mg | ORAL_TABLET | Freq: Two times a day (BID) | ORAL | Status: DC

## 2011-09-10 MED ORDER — TAPENTADOL HCL 50 MG PO TABS: 50.0000 mg | ORAL_TABLET | Freq: Two times a day (BID) | ORAL | Status: DC | PRN

## 2011-09-10 NOTE — Progress Notes (Signed)
Subjective:    Patient ID: [name], female    DOB: 1945/09/02, 66 y.o.   MRN: [id number]  HPI  [name] is back regarding her chronic facial pain. We ordered the MRI at last visit. The schwannoma was stable but there was a venous/fluid collection behind her right eye.  She is going to see a neuro-opthalmologist next month at [facility].  She does notice a decline in her vision through the left eye over the last several months.  From a pain standpoint, her pain in general has been stable. She uses the nucynta for night time pain and finds it very effective. She continues with methadone for baseline pain control.   I reviewed her MRI's and the fluid collection behind her left eye appears new when compared to the 2011 study.     Pain Inventory Average Pain 5 Pain Right Now 5 My pain is burning, stabbing and tingling  In the last 24 hours, has pain interfered with the following? General activity 7 Relation with others 8 Enjoyment of life 9 What TIME of day is your pain at its worst? daytime Sleep (in general) Good  Pain is worse with: talking Pain improves with: rest, heat/ice and medication Relief from Meds: 5  Mobility walk without assistance ability to climb steps?  yes do you drive?  yes transfers alone Do you have any goals in this area?  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 Hypertension Mother   . Heart disease Father    History   Social History  . Marital Status: Married    Spouse Name: N/A    Number of Children: N/A  . Years of Education: N/A   Social History Main Topics  . Smoking status: Never Smoker     . Smokeless tobacco: None  . Alcohol Use: None  . Drug Use: None  . Sexually Active: None   Other Topics Concern  . None   Social History Narrative  . None   Past Surgical History  Procedure Date  . Abdominal hysterectomy    Past Medical History  Diagnosis Date  . Trigeminal neuralgia   . Abnormality of gait   . Facial nerve disorder   . Myofascial pain   . Cervicalgia   . Brachial neuritis or radiculitis NOS    BP 119/60  Pulse 63  Resp 14  Ht 5\' 3"  (1.6 m)  Wt 158 lb (71.668 kg)  BMI 27.99 kg/m2  SpO2 99%     Review of Systems  Eyes: Positive for pain.  Musculoskeletal: Positive for arthralgias.  Neurological: Positive for dizziness, weakness and numbness.  Psychiatric/Behavioral: The patient is nervous/anxious.   All other systems reviewed and are negative.       Objective:   Physical Exam Constitutional: She is oriented to person, place, and time. She appears well-developed and well-nourished.  HENT:  Head: Normocephalic and atraumatic.  Eyes: Conjunctivae and EOM are normal. Pupils are equal, round, and reactive to light.  Cardiovascular: Normal rate and regular rhythm.  Pulmonary/Chest: Effort normal. No respiratory distress. She has no wheezes.  Abdominal: Bowel sounds are normal.  Musculoskeletal: Normal range of motion.  Neurological: She  is alert and oriented to person, place, and time. Balance still off, with occasional ataxia noted.  Left lids do not close completely and she has left facial droop. Left face remains hypersenstiive to touch.  Psychiatric: She has a normal mood and affect. Her behavior is normal. Judgment and thought content normal.    Assessment & Plan:   ASSESSMENT:  1. History of vestibular schwannoma resulting in trigeminal neuralgia and left facial nerve injury  2. History of cervicalgia  3. Chronic pain. 4.Venous blood collection behind left eye which appears new since the MRI from 2011  PLAN:  1. I refilled  methadone 10 mg 1.5-1 p.o. b.i.d., 75 with refill for next month. 2. Nucynta 50 mg 1 p.o. q.12 h. p.r.n. 30 with no refill. Her questions were encouraged and answered.  3. I recommended that she see the neuroptho specialist at [facility]. She has a definite fluid/blood collection behind the left eye.  4. Continue with safety measures at home and with gait. 5. I did Recommend use of an eye patch at night to preven excessive drying of the eye.  6. Continue compounded cream of 20% ketoprofen, 5% elavil, 4% ketamine, and 10% gabapentin.  7. She'll follow up with my PA in 2 months.

## 2011-09-21 DIAGNOSIS — H3530 Unspecified macular degeneration: Secondary | ICD-10-CM | POA: Diagnosis not present

## 2011-09-21 DIAGNOSIS — H2510 Age-related nuclear cataract, unspecified eye: Secondary | ICD-10-CM | POA: Diagnosis not present

## 2011-09-21 DIAGNOSIS — H4920 Sixth [abducent] nerve palsy, unspecified eye: Secondary | ICD-10-CM | POA: Diagnosis not present

## 2011-09-21 DIAGNOSIS — G510 Bell's palsy: Secondary | ICD-10-CM | POA: Diagnosis not present

## 2011-09-21 DIAGNOSIS — H35369 Drusen (degenerative) of macula, unspecified eye: Secondary | ICD-10-CM | POA: Diagnosis not present

## 2011-10-12 DIAGNOSIS — R7989 Other specified abnormal findings of blood chemistry: Secondary | ICD-10-CM | POA: Diagnosis not present

## 2011-10-12 DIAGNOSIS — E785 Hyperlipidemia, unspecified: Secondary | ICD-10-CM | POA: Diagnosis not present

## 2011-11-10 ENCOUNTER — Encounter: Payer: Medicare Other | Attending: Neurosurgery | Admitting: Physical Medicine & Rehabilitation

## 2011-11-10 ENCOUNTER — Encounter: Payer: Self-pay | Admitting: Physical Medicine & Rehabilitation

## 2011-11-10 VITALS — BP 128/80 | HR 55 | Resp 14 | Ht 63.0 in | Wt 151.4 lb

## 2011-11-10 DIAGNOSIS — G500 Trigeminal neuralgia: Secondary | ICD-10-CM | POA: Diagnosis not present

## 2011-11-10 DIAGNOSIS — S0450XA Injury of facial nerve, unspecified side, initial encounter: Secondary | ICD-10-CM | POA: Insufficient documentation

## 2011-11-10 DIAGNOSIS — Z5181 Encounter for therapeutic drug level monitoring: Secondary | ICD-10-CM | POA: Insufficient documentation

## 2011-11-10 DIAGNOSIS — D333 Benign neoplasm of cranial nerves: Secondary | ICD-10-CM

## 2011-11-10 MED ORDER — TAPENTADOL HCL 50 MG PO TABS: 50.0000 mg | ORAL_TABLET | Freq: Two times a day (BID) | ORAL | Status: DC | PRN

## 2011-11-10 MED ORDER — METHADONE HCL 10 MG PO TABS: 5.0000 mg | ORAL_TABLET | Freq: Two times a day (BID) | ORAL | Status: DC

## 2011-11-10 MED ORDER — TAPENTADOL HCL ER 100 MG PO TB12: 100.0000 mg | ORAL_TABLET | Freq: Every day | ORAL | Status: DC

## 2011-11-10 NOTE — Patient Instructions (Addendum)
Please destroy your methadone script if you can get the nucynta ER filled.  YOU MAY PICK UP YOUR SCRIPTS NEXT MONTH. JUST CALL 5 DAYS AHEAD.

## 2011-11-10 NOTE — Progress Notes (Signed)
Subjective:    Patient ID: [name], female    DOB: [birth year], 66 y.o.   MRN: [id number]  HPI  [name] is back regarding her pain, balance issues. She saw the neuro-eye specialist at [facility] who had nothing further to offer.  From a pain standpoint, she has been steady, although she doesn't feel the methadone is helping much at this point. She feels the nucynta works the best, which she takes at night.  Pain Inventory Average Pain 6 Pain Right Now 4 My pain is constant, sharp, burning, stabbing and tingling  In the last 24 hours, has pain interfered with the following? General activity 7 Relation with others 9 Enjoyment of life 10 What TIME of day is your pain at its worst? daytime and evening Sleep (in general) Good  Pain is worse with: talking Pain improves with: medication Relief from Meds: 8  Mobility walk without assistance ability to climb steps?  yes do you drive?  yes  Function retired I need assistance with the following:  household duties and shopping  Neuro/Psych weakness numbness tingling trouble walking dizziness anxiety loss of taste or smell  Prior Studies Any changes since last visit?  no  Physicians involved in your care Any changes since last visit?  no   Family History  Problem Relation Age of Onset  . Hypertension Mother   . Heart disease Father    History   Social History  . Marital Status: Married    Spouse Name: N/A    Number of Children: N/A  . Years of Education: N/A   Social History Main Topics  . Smoking status: Never Smoker   . Smokeless tobacco: Never Used  . Alcohol Use: None  . Drug Use: None  . Sexually Active: None   Other Topics Concern  . None   Social History Narrative  . None   Past Surgical History  Procedure Date  . Abdominal hysterectomy    Past Medical History  Diagnosis Date  . Trigeminal neuralgia   . Abnormality of gait   . Facial nerve disorder   . Myofascial pain   . Cervicalgia    . Brachial neuritis or radiculitis NOS    BP 128/80  Pulse 55  Resp 14  Ht 5\' 3"  (1.6 m)  Wt 151 lb 6.4 oz (68.675 kg)  BMI 26.82 kg/m2  SpO2 100%   Review of Systems  Cardiovascular: Positive for leg swelling.  Gastrointestinal: Positive for constipation.  Genitourinary: Positive for difficulty urinating.  Musculoskeletal:       Facial pain  Neurological: Positive for dizziness, weakness and numbness.       Tingling  All other systems reviewed and are negative.       Objective:   Physical Exam Constitutional: She is oriented to person, place, and time. She appears well-developed and well-nourished.  HENT:  Head: Normocephalic and atraumatic.  Eyes: Conjunctivae and EOM are normal. Pupils are equal, round, and reactive to light.  Cardiovascular: Normal rate and regular rhythm.  Pulmonary/Chest: Effort normal. No respiratory distress. She has no wheezes.  Abdominal: Bowel sounds are normal.  Musculoskeletal: Normal range of motion.  Neurological: She is alert and oriented to person, place, and time. Balance still off, with occasional ataxia noted.  Left lids do not close completely and she has left facial droop. Left face remains hypersenstiive to touch.  Psychiatric: She has a normal mood and affect. Her behavior is normal. Judgment and thought content normal.  Assessment & Plan:  ASSESSMENT:  1. History of vestibular schwannoma resulting in trigeminal neuralgia and left facial nerve injury  2. History of cervicalgia  3. Chronic pain.  4.Venous blood collection behind left eye which appears new since the MRI from 2011  PLAN:  1. I refilled methadone 10 mg 1.5-1 p.o. b.i.d., 75 with refill for next month but we will also see if she can fill the Nucynta ER 100mg  qday with the copay coupons I provided her. If she is able to fill this, she will destroy the methadone rx.  2. Nucynta 50 mg 1 p.o. q.12 h. p.r.n. 30 with no refill. A coupon was also given for this. Her  4.  Continue with safety measures at home and with gait.  5. I did Recommend use of an eye patch at night to preven excessive drying of the eye.  6. Continue compounded cream of 20% ketoprofen, 5% elavil, 4% ketamine, and 10% gabapentin.  7. She'll follow up with my PA in 2 months.

## 2012-01-10 ENCOUNTER — Encounter: Payer: Self-pay | Admitting: Physical Medicine and Rehabilitation

## 2012-01-10 ENCOUNTER — Encounter
Payer: Medicare Other | Attending: Physical Medicine and Rehabilitation | Admitting: Physical Medicine and Rehabilitation

## 2012-01-10 VITALS — BP 122/62 | HR 59 | Resp 14 | Ht 63.0 in | Wt 158.0 lb

## 2012-01-10 DIAGNOSIS — R930 Abnormal findings on diagnostic imaging of skull and head, not elsewhere classified: Secondary | ICD-10-CM | POA: Insufficient documentation

## 2012-01-10 DIAGNOSIS — D333 Benign neoplasm of cranial nerves: Secondary | ICD-10-CM

## 2012-01-10 DIAGNOSIS — G500 Trigeminal neuralgia: Secondary | ICD-10-CM

## 2012-01-10 DIAGNOSIS — H3530 Unspecified macular degeneration: Secondary | ICD-10-CM | POA: Diagnosis not present

## 2012-01-10 DIAGNOSIS — G8929 Other chronic pain: Secondary | ICD-10-CM | POA: Diagnosis not present

## 2012-01-10 DIAGNOSIS — M542 Cervicalgia: Secondary | ICD-10-CM | POA: Diagnosis not present

## 2012-01-10 DIAGNOSIS — S0450XA Injury of facial nerve, unspecified side, initial encounter: Secondary | ICD-10-CM

## 2012-01-10 DIAGNOSIS — R51 Headache: Secondary | ICD-10-CM | POA: Insufficient documentation

## 2012-01-10 MED ORDER — METHADONE HCL 10 MG PO TABS: 5.0000 mg | ORAL_TABLET | Freq: Two times a day (BID) | ORAL | Status: DC

## 2012-01-10 MED ORDER — TAPENTADOL HCL 50 MG PO TABS: 50.0000 mg | ORAL_TABLET | Freq: Two times a day (BID) | ORAL | Status: DC | PRN

## 2012-01-10 NOTE — Progress Notes (Signed)
Subjective:    Patient ID: [name], female    DOB: April 15, 1945, 66 y.o.   MRN: [id number]  HPI [name] is back regarding her chronic facial pain. She saw her eye doctor who rx'ed her corneal irritation/scleral irritation. He also found macular degeneration.  Her pain levels are stable. She still remains sensitive to touch on the left side of her face. Her medications remain fairly effective.  She has followed up with Dr. Wynetta Emery regarding her schwannoma. She has had a new MRI done in June 2013, which did not show any changes. He can not do anything for her at this point  Pain Inventory Average Pain 5 Pain Right Now 5 My pain is burning, stabbing and tingling  In the last 24 hours, has pain interfered with the following? General activity 7 Relation with others 9 Enjoyment of life 10 What TIME of day is your pain at its worst? morning and day Sleep (in general) Good  Pain is worse with: unsure Pain improves with: rest, heat/ice and medication Relief from Meds: 7  Mobility walk without assistance ability to climb steps?  yes do you drive?  yes transfers alone Do you have any goals in this area?  no  Function not employed: date last employed 08/13 retired I need assistance with the following:  household duties and shopping Do you have any goals in this area?  no  Neuro/Psych trouble walking dizziness anxiety  Prior Studies Any changes since last visit?  no  Physicians involved in your care Any changes since last visit?  no   Family History  Problem Relation Age of Onset  . Hypertension Mother   . Heart disease Father    History   Social History  . Marital Status: Married    Spouse Name: N/A    Number of Children: N/A  . Years of Education: N/A   Social History Main Topics  . Smoking status: Never Smoker   . Smokeless tobacco: Never Used  . Alcohol Use: None  . Drug Use: None  . Sexually Active: None   Other Topics Concern  . None   Social  History Narrative  . None   Past Surgical History  Procedure Date  . Abdominal hysterectomy    Past Medical History  Diagnosis Date  . Trigeminal neuralgia   . Abnormality of gait   . Facial nerve disorder   . Myofascial pain   . Cervicalgia   . Brachial neuritis or radiculitis NOS    BP 122/62  Pulse 59  Resp 14  Ht 5\' 3"  (1.6 m)  Wt 158 lb (71.668 kg)  BMI 27.99 kg/m2  SpO2 99%     Review of Systems  Gastrointestinal: Positive for constipation.  Musculoskeletal: Positive for myalgias and arthralgias.  Neurological: Positive for dizziness.  Psychiatric/Behavioral: The patient is nervous/anxious.   All other systems reviewed and are negative.       Objective:   Physical Exam Constitutional: She is oriented to person, place, and time. She appears well-developed and well-nourished.  HENT:  Head: Normocephalic and atraumatic.  Cardiovascular: Normal rate and regular rhythm.  Pulmonary/Chest: Effort normal. No respiratory distress. She has no wheezes.  Abdominal: Bowel sounds are normal.  Musculoskeletal: Normal range of motion.  Neurological: She is alert and oriented to person, place, and time.  Left lids do not close completely and she has left facial droop. Left face remains hypersenstiive to touch.  Psychiatric: She has a normal mood and affect. Her behavior is  normal. Judgment and thought content normal.         Assessment & Plan:  1. History of vestibular schwannoma resulting in trigeminal neuralgia and left facial nerve injury  2. History of cervicalgia  3. Chronic pain.  4.Venous blood collection behind left eye which appears new since the MRI from 2011  PLAN:  1. I refilled methadone 10 mg 1.5-1 p.o. b.i.d., 75 with refill for next month , she could not  fill the Nucynta ER 100mg  qday, because of the cost. 2. Nucynta 50 mg 1 p.o. q.12 h. p.r.n. 30 with no refill.  4. Continue with safety measures at home and with gait.  5. I did Recommend use of an  eye patch at night to preven excessive drying of the eye, she states, that she does not use a patch.  6. Continue compounded cream of 20% ketoprofen, 5% elavil, 4% ketamine, and 10% gabapentin.  7. She'll follow up with my PA in 2 months.

## 2012-01-10 NOTE — Patient Instructions (Signed)
Try to stay as active as tolerated, continue with walking.

## 2012-03-10 ENCOUNTER — Encounter: Payer: Medicare Other | Admitting: Physical Medicine and Rehabilitation

## 2012-04-11 ENCOUNTER — Ambulatory Visit: Payer: Medicare Other | Admitting: Physical Medicine and Rehabilitation

## 2012-05-30 ENCOUNTER — Encounter
Payer: Medicare Other | Attending: Physical Medicine and Rehabilitation | Admitting: Physical Medicine and Rehabilitation

## 2012-05-30 ENCOUNTER — Encounter: Payer: Self-pay | Admitting: Physical Medicine and Rehabilitation

## 2012-05-30 VITALS — BP 131/57 | HR 59 | Resp 14 | Ht 63.0 in | Wt 163.4 lb

## 2012-05-30 DIAGNOSIS — S0452XD Injury of facial nerve, left side, subsequent encounter: Secondary | ICD-10-CM

## 2012-05-30 DIAGNOSIS — Z86011 Personal history of benign neoplasm of the brain: Secondary | ICD-10-CM | POA: Diagnosis not present

## 2012-05-30 DIAGNOSIS — G500 Trigeminal neuralgia: Secondary | ICD-10-CM | POA: Diagnosis not present

## 2012-05-30 DIAGNOSIS — R29810 Facial weakness: Secondary | ICD-10-CM | POA: Diagnosis not present

## 2012-05-30 DIAGNOSIS — D333 Benign neoplasm of cranial nerves: Secondary | ICD-10-CM | POA: Diagnosis not present

## 2012-05-30 DIAGNOSIS — Z5181 Encounter for therapeutic drug level monitoring: Secondary | ICD-10-CM

## 2012-05-30 DIAGNOSIS — IMO0001 Reserved for inherently not codable concepts without codable children: Secondary | ICD-10-CM | POA: Insufficient documentation

## 2012-05-30 DIAGNOSIS — G894 Chronic pain syndrome: Secondary | ICD-10-CM | POA: Diagnosis not present

## 2012-05-30 DIAGNOSIS — S0452XS Injury of facial nerve, left side, sequela: Secondary | ICD-10-CM

## 2012-05-30 DIAGNOSIS — S049XXS Injury of unspecified cranial nerve, sequela: Secondary | ICD-10-CM

## 2012-05-30 DIAGNOSIS — Z5189 Encounter for other specified aftercare: Secondary | ICD-10-CM

## 2012-05-30 DIAGNOSIS — Z79899 Other long term (current) drug therapy: Secondary | ICD-10-CM | POA: Insufficient documentation

## 2012-05-30 MED ORDER — TAPENTADOL HCL 50 MG PO TABS: 50.0000 mg | ORAL_TABLET | Freq: Two times a day (BID) | ORAL | Status: DC | PRN

## 2012-05-30 MED ORDER — METHADONE HCL 10 MG PO TABS: 5.0000 mg | ORAL_TABLET | Freq: Two times a day (BID) | ORAL | Status: DC

## 2012-05-30 NOTE — Addendum Note (Signed)
Addended by: Judd Gaudier on: 05/30/2012 01:36 PM   Modules accepted: Orders

## 2012-05-30 NOTE — Patient Instructions (Signed)
-  Continue staying active

## 2012-05-30 NOTE — Progress Notes (Signed)
Subjective:    Patient ID: [name], female    DOB: [birth year], 67 y.o.   MRN: [id number]  HPI [name] is back regarding her chronic facial pain. She saw her eye doctor who rx'ed her corneal irritation/scleral irritation. He also found macular degeneration.  Her pain levels are stable. She still remains sensitive to touch on the left side of her face. Her medications remain fairly effective. She reports that she feels a slight increase in her symptoms.  She has followed up with Dr. Wynetta Emery regarding her schwannoma. She has had a new MRI done in June 2013, which did not show any changes. He can not do anything for her at this point. Dr. Wynetta Emery suggested that she should follow up with her surgeon Dr. Andrey Campanile at [facility].  Pain Inventory Average Pain 6 Pain Right Now 3 My pain is burning, stabbing and tingling  In the last 24 hours, has pain interfered with the following? General activity 8 Relation with others 9 Enjoyment of life 10 What TIME of day is your pain at its worst? morning and evening Sleep (in general) Good  Pain is worse with: some activites and talking Pain improves with: heat/ice and medication Relief from Meds: 6  Mobility walk without assistance  Function not employed: date last employed 2005  Neuro/Psych weakness numbness trouble walking dizziness anxiety loss of taste or smell  Prior Studies Any changes since last visit?  no  Physicians involved in your care Any changes since last visit?  no   Family History  Problem Relation Age of Onset  . Hypertension Mother   . Heart disease Father    History   Social History  . Marital Status: Married    Spouse Name: N/A    Number of Children: N/A  . Years of Education: N/A   Social History Main Topics  . Smoking status: Never Smoker   . Smokeless tobacco: Never Used  . Alcohol Use: None  . Drug Use: None  . Sexually Active: None   Other Topics Concern  . None   Social History Narrative  . None    Past Surgical History  Procedure Laterality Date  . Abdominal hysterectomy     Past Medical History  Diagnosis Date  . Trigeminal neuralgia   . Abnormality of gait   . Facial nerve disorder   . Myofascial pain   . Cervicalgia   . Brachial neuritis or radiculitis NOS    BP 131/57  Pulse 59  Resp 14  Ht 5\' 3"  (1.6 m)  Wt 163 lb 6.4 oz (74.118 kg)  BMI 28.95 kg/m2  SpO2 99%    Review of Systems  Constitutional:       Loss of taste or smell  Cardiovascular: Positive for leg swelling.  Gastrointestinal: Positive for nausea and constipation.  Genitourinary: Positive for difficulty urinating.  Musculoskeletal: Positive for gait problem.  Neurological: Positive for dizziness, weakness and numbness.  Psychiatric/Behavioral: The patient is nervous/anxious.   All other systems reviewed and are negative.       Objective:   Physical Exam Constitutional: She is oriented to person, place, and time. She appears well-developed and well-nourished.  HENT:  Head: Normocephalic and atraumatic.  Cardiovascular: Normal rate and regular rhythm.  Pulmonary/Chest: Effort normal. No respiratory distress. She has no wheezes.  Abdominal: Bowel sounds are normal.  Musculoskeletal: Normal range of motion.  Neurological: She is alert and oriented to person, place, and time.  Left lids do not close completely and she  has left facial droop. Left face remains hypersenstiive to touch.  Psychiatric: She has a normal mood and affect. Her behavior is normal. Judgment and thought content normal.        Assessment & Plan:  1. History of vestibular schwannoma resulting in trigeminal neuralgia and left facial nerve injury  2. History of cervicalgia  3. Chronic pain.  4.Venous blood collection behind left eye which appears new since the MRI from 2011  PLAN:  1. I refilled methadone 10 mg 1 p.o. B.i.d., # 60 , this usually last her 2 month  2. Nucynta 50 mg 1 p.o. q.12 h. p.r.n. 30 with no  refill, usually enough for 2 month.  4. Continue with safety measures at home and with gait.  5. Referral to her neurosurgeon Dr. Andrey Campanile at [facility], for her increasing symptoms.  6. Continue compounded cream of 20% ketoprofen, 5% elavil, 4% ketamine, and 10% gabapentin.  7. She'll follow up with PA in 2 months.

## 2012-06-05 ENCOUNTER — Ambulatory Visit (INDEPENDENT_AMBULATORY_CARE_PROVIDER_SITE_OTHER): Payer: Medicare Other | Admitting: Ophthalmology

## 2012-06-15 DIAGNOSIS — R29810 Facial weakness: Secondary | ICD-10-CM | POA: Diagnosis not present

## 2012-06-15 DIAGNOSIS — Z8669 Personal history of other diseases of the nervous system and sense organs: Secondary | ICD-10-CM | POA: Diagnosis not present

## 2012-06-15 DIAGNOSIS — Z9889 Other specified postprocedural states: Secondary | ICD-10-CM | POA: Diagnosis not present

## 2012-06-15 DIAGNOSIS — R209 Unspecified disturbances of skin sensation: Secondary | ICD-10-CM | POA: Diagnosis not present

## 2012-07-03 ENCOUNTER — Ambulatory Visit (INDEPENDENT_AMBULATORY_CARE_PROVIDER_SITE_OTHER): Payer: Medicare Other | Admitting: Ophthalmology

## 2012-07-03 DIAGNOSIS — H43819 Vitreous degeneration, unspecified eye: Secondary | ICD-10-CM

## 2012-07-03 DIAGNOSIS — H3530 Unspecified macular degeneration: Secondary | ICD-10-CM

## 2012-07-03 DIAGNOSIS — I10 Essential (primary) hypertension: Secondary | ICD-10-CM

## 2012-07-03 DIAGNOSIS — H35039 Hypertensive retinopathy, unspecified eye: Secondary | ICD-10-CM

## 2012-07-13 DIAGNOSIS — H903 Sensorineural hearing loss, bilateral: Secondary | ICD-10-CM | POA: Diagnosis not present

## 2012-07-13 DIAGNOSIS — Z9889 Other specified postprocedural states: Secondary | ICD-10-CM | POA: Diagnosis not present

## 2012-07-13 DIAGNOSIS — H832X9 Labyrinthine dysfunction, unspecified ear: Secondary | ICD-10-CM | POA: Diagnosis not present

## 2012-07-13 DIAGNOSIS — R29810 Facial weakness: Secondary | ICD-10-CM | POA: Diagnosis not present

## 2012-07-24 ENCOUNTER — Ambulatory Visit: Payer: Medicare Other | Admitting: Physical Medicine & Rehabilitation

## 2012-08-07 ENCOUNTER — Encounter: Payer: Medicare Other | Attending: Physical Medicine and Rehabilitation | Admitting: Physical Medicine & Rehabilitation

## 2012-08-07 ENCOUNTER — Encounter: Payer: Self-pay | Admitting: Physical Medicine & Rehabilitation

## 2012-08-07 VITALS — BP 124/60 | HR 61 | Resp 14 | Ht 63.0 in | Wt 162.0 lb

## 2012-08-07 DIAGNOSIS — G500 Trigeminal neuralgia: Secondary | ICD-10-CM

## 2012-08-07 DIAGNOSIS — M542 Cervicalgia: Secondary | ICD-10-CM | POA: Insufficient documentation

## 2012-08-07 DIAGNOSIS — D333 Benign neoplasm of cranial nerves: Secondary | ICD-10-CM | POA: Diagnosis not present

## 2012-08-07 DIAGNOSIS — T148XXS Other injury of unspecified body region, sequela: Secondary | ICD-10-CM | POA: Insufficient documentation

## 2012-08-07 DIAGNOSIS — G8929 Other chronic pain: Secondary | ICD-10-CM | POA: Insufficient documentation

## 2012-08-07 DIAGNOSIS — S049XXS Injury of unspecified cranial nerve, sequela: Secondary | ICD-10-CM

## 2012-08-07 DIAGNOSIS — S0452XD Injury of facial nerve, left side, subsequent encounter: Secondary | ICD-10-CM

## 2012-08-07 DIAGNOSIS — S0452XS Injury of facial nerve, left side, sequela: Secondary | ICD-10-CM

## 2012-08-07 DIAGNOSIS — Z5189 Encounter for other specified aftercare: Secondary | ICD-10-CM

## 2012-08-07 DIAGNOSIS — X58XXXS Exposure to other specified factors, sequela: Secondary | ICD-10-CM | POA: Insufficient documentation

## 2012-08-07 MED ORDER — NONFORMULARY OR COMPOUNDED ITEM: Status: DC

## 2012-08-07 MED ORDER — METHADONE HCL 10 MG PO TABS: 5.0000 mg | ORAL_TABLET | Freq: Two times a day (BID) | ORAL | Status: DC

## 2012-08-07 MED ORDER — TAPENTADOL HCL 50 MG PO TABS: 50.0000 mg | ORAL_TABLET | Freq: Two times a day (BID) | ORAL | Status: DC | PRN

## 2012-08-07 NOTE — Patient Instructions (Signed)
CALL ME WITH ANY PROBLEMS OR QUESTIONS (#[phone]).  HAVE A GOOD DAY  

## 2012-08-07 NOTE — Progress Notes (Signed)
Subjective:    Patient ID: [name], female    DOB: 05/07/1945, 67 y.o.   MRN: [id number]  HPI  Damien is back regarding her left facial pain/ vestibular schwannoma. She saw Dr. Andrey Campanile in follow up who saw no residual tumor and recommended general surveillance of the tumor/symptoms and to check an MRI every couple years.   Her symptoms have remained fairly stable. She is looking at a new hearing aid.   Her medications are holding her fairly well. She is still on the methadone daily with the nucynta for severe breakthrough which really is effective.       Pain Inventory Average Pain 6 Pain Right Now 3 My pain is constant, burning, stabbing and tingling  In the last 24 hours, has pain interfered with the following? General activity 6 Relation with others 9 Enjoyment of life 10 What TIME of day is your pain at its worst? day and evening Sleep (in general) Fair  Pain is worse with: some activites Pain improves with: heat/ice and medication Relief from Meds: 9  Mobility walk without assistance ability to climb steps?  yes do you drive?  yes transfers alone Do you have any goals in this area?  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  no  Neuro/Psych weakness numbness tingling trouble walking dizziness anxiety loss of taste or smell  Prior Studies Any changes since last visit?  no  Physicians involved in your care Any changes since last visit?  no   Family History  Problem Relation Age of Onset  . Hypertension Mother   . Heart disease Father    History   Social History  . Marital Status: Married    Spouse Name: N/A    Number of Children: N/A  . Years of Education: N/A   Social History Main Topics  . Smoking status: Never Smoker   . Smokeless tobacco: Never Used  . Alcohol Use: None  . Drug Use: None  . Sexually Active: None    Other Topics Concern  . None   Social History Narrative  . None   Past Surgical History  Procedure Laterality Date  . Abdominal hysterectomy     Past Medical History  Diagnosis Date  . Trigeminal neuralgia   . Abnormality of gait   . Facial nerve disorder   . Myofascial pain   . Cervicalgia   . Brachial neuritis or radiculitis NOS    BP 124/60  Pulse 61  Resp 14  Ht 5\' 3"  (1.6 m)  Wt 162 lb (73.483 kg)  BMI 28.7 kg/m2  SpO2 98%     Review of Systems  Gastrointestinal: Positive for constipation.  Neurological: Positive for dizziness, weakness and numbness.  Psychiatric/Behavioral: The patient is nervous/anxious.   All other systems reviewed and are negative.       Objective:   Physical Exam  HENT:  Head: Normocephalic and atraumatic.  Eyes: Conjunctivae and EOM are normal. Pupils are equal, round, and reactive to light.  Cardiovascular: Normal rate and regular rhythm.  Pulmonary/Chest: Effort normal. No respiratory distress. She has no wheezes.  Abdominal: Bowel sounds are normal.  Musculoskeletal: Normal range of motion.  Neurological: She is alert and oriented to person, place, and time. Balance still off, with occasional ataxia noted.  Left lids do not close completely and she has left facial droop. Left face remains hypersenstiive  to touch.  Psychiatric: She has a normal mood and affect. Her behavior is normal. Judgment and thought content normal.    Assessment & Plan:   ASSESSMENT:  1. History of vestibular schwannoma resulting in trigeminal neuralgia and left facial nerve injury- no tumor regrowth per NS 2. History of cervicalgia  3. Chronic pain.  4.Venous blood collection behind left eye which appears new since the MRI from 2011    PLAN:  1. I refilled methadone 10 mg 1.5-1 p.o. b.i.d., 75 with refill for next month. Consider Nucynta ER 100mg  qday also as a back up if methadone becomes ineffective. 2. Nucynta 50 mg 1 p.o. q.12 h. p.r.n. 30  with no refill.  4. Continue with safety measures at home and with gait. She is fairly realistic about her activity levels and safety 5.Continue use of an eye patch at night to preven excessive drying of the eye. Eye gtts during the day. 6. Refilled compounded cream of 20% ketoprofen, 5% elavil, 4% ketamine, and 10% gabapentin.  7. She'll follow up with my PA in 2 months.

## 2012-09-25 ENCOUNTER — Encounter
Payer: Medicare Other | Attending: Physical Medicine and Rehabilitation | Admitting: Physical Medicine and Rehabilitation

## 2012-09-25 ENCOUNTER — Encounter: Payer: Self-pay | Admitting: Physical Medicine and Rehabilitation

## 2012-09-25 VITALS — BP 124/56 | HR 62 | Resp 14 | Ht 61.0 in | Wt 162.6 lb

## 2012-09-25 DIAGNOSIS — H3530 Unspecified macular degeneration: Secondary | ICD-10-CM | POA: Diagnosis not present

## 2012-09-25 DIAGNOSIS — D333 Benign neoplasm of cranial nerves: Secondary | ICD-10-CM

## 2012-09-25 DIAGNOSIS — R51 Headache: Secondary | ICD-10-CM | POA: Insufficient documentation

## 2012-09-25 DIAGNOSIS — S0452XS Injury of facial nerve, left side, sequela: Secondary | ICD-10-CM

## 2012-09-25 DIAGNOSIS — S049XXS Injury of unspecified cranial nerve, sequela: Secondary | ICD-10-CM

## 2012-09-25 DIAGNOSIS — G500 Trigeminal neuralgia: Secondary | ICD-10-CM

## 2012-09-25 DIAGNOSIS — M542 Cervicalgia: Secondary | ICD-10-CM | POA: Insufficient documentation

## 2012-09-25 DIAGNOSIS — G8929 Other chronic pain: Secondary | ICD-10-CM | POA: Insufficient documentation

## 2012-09-25 MED ORDER — METHADONE HCL 10 MG PO TABS: 5.0000 mg | ORAL_TABLET | Freq: Two times a day (BID) | ORAL | Status: DC

## 2012-09-25 MED ORDER — TAPENTADOL HCL 50 MG PO TABS: 50.0000 mg | ORAL_TABLET | Freq: Two times a day (BID) | ORAL | Status: DC | PRN

## 2012-09-25 NOTE — Patient Instructions (Signed)
Continue with your walking program. 

## 2012-09-25 NOTE — Progress Notes (Signed)
Subjective:    Patient ID: [name], female    DOB: 11-Jul-1945, 67 y.o.   MRN: [id number]  HPI [name] is back regarding her chronic facial pain. She saw her eye doctor who rx'ed her corneal irritation/scleral irritation. He also found macular degeneration.  Her pain levels are stable. She still remains sensitive to touch on the left side of her face. Her medications remain fairly effective. She reports that she feels considerably well.  She has followed up with Dr. Wynetta Emery regarding her schwannoma. She has had a new MRI done in June 2013, which did not show any changes. He can not do anything for her at this point. Dr. Wynetta Emery suggested that she should follow up with her surgeon Dr. Andrey Campanile at [facility], which she did, he stated her situation is stable at this point.  Pain Inventory Average Pain 5 Pain Right Now 3 My pain is burning, stabbing and tingling  In the last 24 hours, has pain interfered with the following? General activity 7 Relation with others 9 Enjoyment of life 9 What TIME of day is your pain at its worst? evening Sleep (in general) Good  Pain is worse with: talking Pain improves with: rest, heat/ice and medication Relief from Meds: 7  Mobility walk without assistance ability to climb steps?  yes do you drive?  yes transfers alone Do you have any goals in this area?  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  no  Neuro/Psych weakness dizziness anxiety loss of taste or smell  Prior Studies Any changes since last visit?  no  Physicians involved in your care Any changes since last visit?  no   Family History  Problem Relation Age of Onset  . Hypertension Mother   . Heart disease Father    History   Social History  . Marital Status: Married    Spouse Name: N/A    Number of Children: N/A  . Years  of Education: N/A   Social History Main Topics  . Smoking status: Never Smoker   . Smokeless tobacco: Never Used  . Alcohol Use: None  . Drug Use: None  . Sexually Active: None   Other Topics Concern  . None   Social History Narrative  . None   Past Surgical History  Procedure Laterality Date  . Abdominal hysterectomy     Past Medical History  Diagnosis Date  . Trigeminal neuralgia   . Abnormality of gait   . Facial nerve disorder   . Myofascial pain   . Cervicalgia   . Brachial neuritis or radiculitis NOS    BP 124/56  Pulse 62  Resp 14  Ht 5\' 1"  (1.549 m)  Wt 162 lb 9.6 oz (73.755 kg)  BMI 30.74 kg/m2  SpO2 96%     Review of Systems  Constitutional:       Loss of taste/smell  Cardiovascular: Positive for leg swelling.  Gastrointestinal: Positive for constipation.  Neurological: Positive for dizziness and weakness.  Psychiatric/Behavioral: The patient is nervous/anxious.   All other systems reviewed and are negative.       Objective:   Physical Exam Constitutional: She is oriented to person, place, and time. She appears well-developed and well-nourished.  HENT:  Head: Normocephalic and atraumatic.  Cardiovascular: Normal rate and regular rhythm.  Pulmonary/Chest: Effort normal. No respiratory distress.  She has no wheezes.  Abdominal: Bowel sounds are normal.  Musculoskeletal: Normal range of motion.  Neurological: She is alert and oriented to person, place, and time.  Left lids do not close completely and she has left facial droop. Left face remains hypersenstiive to touch.  Psychiatric: She has a normal mood and affect. Her behavior is normal. Judgment and thought content normal.        Assessment & Plan:  1. History of vestibular schwannoma resulting in trigeminal neuralgia and left facial nerve injury  2. History of cervicalgia  3. Chronic pain.  4.Venous blood collection behind left eye which appears new since the MRI from 2011  PLAN:  1.  I refilled methadone 10 mg 1 p.o. B.i.d., # 60 , for 2 month  2. Nucynta 50 mg 1 p.o. q.12 h. p.r.n. 30 with no refill,  for 2 month.  4. Continue with safety measures at home and with gait.  5. Referral to her neurosurgeon Dr. Andrey Campanile at [facility], for her increasing symptoms.  6. Continue compounded cream of 20% ketoprofen, 5% elavil, 4% ketamine, and 10% gabapentin.  7. Encouraged her to continue with her walking program She'll follow up with PA in 2 months.

## 2012-11-15 ENCOUNTER — Other Ambulatory Visit: Payer: Self-pay | Admitting: Family Medicine

## 2012-11-20 ENCOUNTER — Encounter: Payer: Self-pay | Admitting: Physical Medicine and Rehabilitation

## 2012-11-20 ENCOUNTER — Encounter
Payer: Medicare Other | Attending: Physical Medicine and Rehabilitation | Admitting: Physical Medicine and Rehabilitation

## 2012-11-20 VITALS — BP 136/67 | HR 76 | Resp 14 | Ht 62.0 in | Wt 163.5 lb

## 2012-11-20 DIAGNOSIS — Z79899 Other long term (current) drug therapy: Secondary | ICD-10-CM

## 2012-11-20 DIAGNOSIS — S0452XS Injury of facial nerve, left side, sequela: Secondary | ICD-10-CM

## 2012-11-20 DIAGNOSIS — G8929 Other chronic pain: Secondary | ICD-10-CM | POA: Insufficient documentation

## 2012-11-20 DIAGNOSIS — Z5181 Encounter for therapeutic drug level monitoring: Secondary | ICD-10-CM

## 2012-11-20 DIAGNOSIS — G500 Trigeminal neuralgia: Secondary | ICD-10-CM | POA: Insufficient documentation

## 2012-11-20 DIAGNOSIS — S049XXS Injury of unspecified cranial nerve, sequela: Secondary | ICD-10-CM

## 2012-11-20 DIAGNOSIS — M542 Cervicalgia: Secondary | ICD-10-CM | POA: Diagnosis not present

## 2012-11-20 DIAGNOSIS — H3530 Unspecified macular degeneration: Secondary | ICD-10-CM | POA: Insufficient documentation

## 2012-11-20 DIAGNOSIS — D333 Benign neoplasm of cranial nerves: Secondary | ICD-10-CM | POA: Insufficient documentation

## 2012-11-20 MED ORDER — METHADONE HCL 10 MG PO TABS: 5.0000 mg | ORAL_TABLET | Freq: Two times a day (BID) | ORAL | Status: DC

## 2012-11-20 MED ORDER — TAPENTADOL HCL 50 MG PO TABS: 50.0000 mg | ORAL_TABLET | Freq: Two times a day (BID) | ORAL | Status: DC | PRN

## 2012-11-20 NOTE — Progress Notes (Signed)
Subjective:    Patient ID: [name], female    DOB: 02-Sep-1945, 67 y.o.   MRN: [id number]  HPI Senie is back regarding her chronic facial pain. She saw her eye doctor who rx'ed her corneal irritation/scleral irritation. He also found macular degeneration.  Her pain levels are stable. She still remains sensitive to touch on the left side of her face. Her medications remain fairly effective. She reports that she feels considerably well.  She has followed up with Dr. Wynetta Emery regarding her schwannoma. She has had a new MRI done in June 2013, which did not show any changes. He can not do anything for her at this point. Dr. Wynetta Emery suggested that she should follow up with her surgeon Dr. Andrey Campanile at [facility], which she did, he stated her situation is stable at this point.  Pain Inventory Average Pain 6 Pain Right Now 4 My pain is burning, stabbing and tingling  In the last 24 hours, has pain interfered with the following? General activity 5 Relation with others 8 Enjoyment of life 9 What TIME of day is your pain at its worst? daytime and evening Sleep (in general) Good  Pain is worse with: talking Pain improves with: rest, heat/ice and medication Relief from Meds: 6  Mobility walk without assistance ability to climb steps?  yes do you drive?  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  no  Physicians involved in your care Any changes since last visit?  no   Family History  Problem Relation Age of Onset  . Hypertension Mother   . Heart disease Father    History   Social History  . Marital Status: Married    Spouse Name: N/A    Number of Children: N/A  . Years of Education: N/A   Social History Main Topics  . Smoking  status: Never Smoker   . Smokeless tobacco: Never Used  . Alcohol Use: None  . Drug Use: None  . Sexual Activity: None   Other Topics Concern  . None   Social History Narrative  . None   Past Surgical History  Procedure Laterality Date  . Abdominal hysterectomy     Past Medical History  Diagnosis Date  . Trigeminal neuralgia   . Abnormality of gait   . Facial nerve disorder   . Myofascial pain   . Cervicalgia   . Brachial neuritis or radiculitis NOS    BP 136/67  Pulse 76  Resp 14  Ht 5\' 2"  (1.575 m)  Wt 163 lb 8 oz (74.163 kg)  BMI 29.9 kg/m2  SpO2 95%    Review of Systems  Constitutional: Positive for unexpected weight change.       Loss of taste or smell  HENT:       Facial pain  Cardiovascular: Positive for leg swelling.  Gastrointestinal: Positive for constipation.  Genitourinary: Positive for difficulty urinating.  Neurological: Positive for dizziness, weakness and numbness.       Tingling  Psychiatric/Behavioral: The patient is nervous/anxious.   All other systems reviewed and are negative.       Objective:   Physical Exam Constitutional: She is oriented to person, place, and time. She appears well-developed and well-nourished.  HENT:  Head: Normocephalic and atraumatic.  Cardiovascular:  Normal rate and regular rhythm.  Pulmonary/Chest: Effort normal. No respiratory distress. She has no wheezes.  Abdominal: Bowel sounds are normal.  Musculoskeletal: Normal range of motion.  Neurological: She is alert and oriented to person, place, and time.  Left lids do not close completely and she has left facial droop. Left face remains hypersenstiive to touch.  Psychiatric: She has a normal mood and affect. Her behavior is normal. Judgment and thought content normal.        Assessment & Plan:  1. History of vestibular schwannoma resulting in trigeminal neuralgia and left facial nerve injury  2. History of cervicalgia  3. Chronic pain.  4.4.Venous  blood collection behind left eye which appears new since MRI from 2011, Otherwise stable MRI in 2013, compared to the MRI from 2011  PLAN:  1. I refilled methadone 10 mg 1 p.o. B.i.d., # 60 , for 2 month  2. Nucynta 50 mg 1 p.o. q.12 h. p.r.n. 30 with no refill, for 2 month.  4. Continue with safety measures at home and with gait.  5.  Continue compounded cream of 20% ketoprofen, 5% elavil, 4% ketamine, and 10% gabapentin.  6. Encouraged her to continue with her walking program  She'll follow up with PA in 2 months.

## 2012-11-20 NOTE — Patient Instructions (Addendum)
Stay as active as tolerated, continue with your walking program 

## 2012-12-15 ENCOUNTER — Encounter: Payer: Self-pay | Admitting: Internal Medicine

## 2012-12-15 ENCOUNTER — Telehealth: Payer: Self-pay | Admitting: *Deleted

## 2012-12-15 ENCOUNTER — Other Ambulatory Visit: Payer: Self-pay | Admitting: *Deleted

## 2012-12-15 ENCOUNTER — Ambulatory Visit (INDEPENDENT_AMBULATORY_CARE_PROVIDER_SITE_OTHER): Payer: Medicare Other | Admitting: Internal Medicine

## 2012-12-15 VITALS — BP 150/77 | HR 81 | Temp 98.1°F | Ht 63.0 in | Wt 161.0 lb

## 2012-12-15 DIAGNOSIS — E876 Hypokalemia: Secondary | ICD-10-CM

## 2012-12-15 DIAGNOSIS — I10 Essential (primary) hypertension: Secondary | ICD-10-CM

## 2012-12-15 DIAGNOSIS — Z0000 Encounter for general adult medical examination without abnormal findings: Secondary | ICD-10-CM

## 2012-12-15 DIAGNOSIS — R946 Abnormal results of thyroid function studies: Secondary | ICD-10-CM

## 2012-12-15 DIAGNOSIS — R5381 Other malaise: Secondary | ICD-10-CM

## 2012-12-15 LAB — LIPID PANEL
Cholesterol: 292 mg/dL — ABNORMAL HIGH (ref 0–200)
Total CHOL/HDL Ratio: 5
Triglycerides: 271 mg/dL — ABNORMAL HIGH (ref 0.0–149.0)

## 2012-12-15 LAB — CBC WITH DIFFERENTIAL/PLATELET
Basophils Relative: 0.4 % (ref 0.0–3.0)
Eosinophils Relative: 3.5 % (ref 0.0–5.0)
Lymphocytes Relative: 33.6 % (ref 12.0–46.0)
Neutrophils Relative %: 54.9 % (ref 43.0–77.0)
Platelets: 279 10*3/uL (ref 150.0–400.0)
RBC: 4.57 Mil/uL (ref 3.87–5.11)
WBC: 10.4 10*3/uL (ref 4.5–10.5)

## 2012-12-15 LAB — HEPATIC FUNCTION PANEL
ALT: 14 U/L (ref 0–35)
AST: 18 U/L (ref 0–37)
Albumin: 4 g/dL (ref 3.5–5.2)
Total Bilirubin: 0.8 mg/dL (ref 0.3–1.2)
Total Protein: 7.2 g/dL (ref 6.0–8.3)

## 2012-12-15 LAB — TSH: TSH: 4.56 u[IU]/mL (ref 0.35–5.50)

## 2012-12-15 LAB — BASIC METABOLIC PANEL
Calcium: 9.7 mg/dL (ref 8.4–10.5)
Creatinine, Ser: 0.9 mg/dL (ref 0.4–1.2)
GFR: 69.55 mL/min (ref >60.00)

## 2012-12-15 MED ORDER — POTASSIUM CHLORIDE ER 10 MEQ PO TBCR: EXTENDED_RELEASE_TABLET | ORAL | Status: DC

## 2012-12-15 MED ORDER — LOSARTAN POTASSIUM-HCTZ 100-12.5 MG PO TABS: 1.0000 | ORAL_TABLET | Freq: Every day | ORAL | Status: DC

## 2012-12-15 NOTE — Patient Instructions (Addendum)
Your next office appointment will be determined based upon review of your pending labs . Those instructions will be transmitted to you through My Chart .  Please report any significant change in your symptoms. The best exercises for the low back include freestyle swimming, stretch aerobics, and yoga. Minimal Blood Pressure Goal= AVERAGE < 140/90;  Ideal is an AVERAGE < 135/85. This AVERAGE should be calculated from @ least 5-7 BP readings taken @ different times of day on different days of week. You should not respond to isolated BP readings , but rather the AVERAGE for that week .Please bring your  blood pressure cuff to office visits to verify that it is reliable.It  can also be checked against the blood pressure device at the pharmacy. Finger or wrist cuffs are not dependable; an arm cuff is. As per the Standard of Care , screening Colonoscopy recommended @ 50 & every 5-10 years thereafter . More frequent monitor would be dictated by family history or findings @ Colonoscopy

## 2012-12-15 NOTE — Progress Notes (Signed)
Subjective:    Patient ID: [name], female    DOB: Jan 04, 1946, 67 y.o.   MRN: [id number]  HPI Medicare Wellness Visit: Psychosocial and medical history were reviewed as required by Medicare (history related to abuse, antisocial behavior , firearm risk). Social history: Caffeine:1 cup coffee / day  , Alcohol: no , Tobacco use: never Exercise:no Personal safety/fall risk:chronic imbalance but no falls Limitations of activities of daily living:no Seatbelt/ smoke alarm use:yes Healthcare Power of Attorney/Living Will status: needed Ophthalmologic exam status:current Hearing evaluation status:current Orientation: Oriented X3 Memory and recall: good Math testing: good Depression/anxiety assessment: anxiety due to pain Foreign travel history:[place] 1996 Immunization status for influenza/pneumonia/ shingles /tetanus:declined Transfusion history:no Preventive health care maintenance status: Colonoscopy/BMD/mammogram/Pap as per protocol/standard care:declined Dental care:current but seen prn only Chart reviewed and updated. Active issues reviewed and addressed as documented below.    Review of Systems  Over the last week she's had pain in the right thigh at night. This will also be present posteriorly as well intermittently. There's been no rash or change in color or temperature of skin in this area. She denies incontinence of urine or stool. She also has not had fever, chills, sweats, or weight loss. She does have a history of lumbar discectomy remotely. Significant headaches, epistaxis, chest pain, palpitations, exertional dyspnea, claudication, or paroxysmal nocturnal dyspnea absent.Some pedal edema &  lightheadedness intermittently. No medication adverse effect described. Compliant with anti hypertemsive medication.Blood pressure not monitored.      Objective:   Physical Exam Gen.: Healthy and well-nourished in appearance. Alert, appropriate and cooperative throughout  exam. Appears younger than stated age  Head: Normocephalic without obvious abnormalities. Facial post op asymmetry  Eyes: No corneal or conjunctival inflammation noted. Ptosis OS Ears: External  ear exam reveals no significant lesions or deformities.  Hearing aid on R. Nose: External nasal exam reveals no deformity or inflammation. Nasal mucosa are pink and moist. No lesions or exudates noted.   Mouth: Oral mucosa and oropharynx reveal no lesions or exudates. Teeth in good repair. Neck: No deformities, masses, or tenderness noted. Range of motion decreased to L. Thyroid asymmetric ; no nodules. Lungs: Normal respiratory effort; chest expands symmetrically. Lungs are clear to auscultation without rales, wheezes, or increased work of breathing. Heart: Normal rate and rhythm. Normal S1 and S2. No gallop, click, or rub. Grade 1/2 over 6 systolic murmur Abdomen: Bowel sounds normal; abdomen soft and nontender. No masses, organomegaly or hernias noted. Genitalia:    As per Gyn                                  Musculoskeletal/extremities:  Accentuated curvature of mid thoracic  Spine. No clubbing, cyanosis, edema, or significant extremity  deformity noted. Range of motion normal .Tone & strength  Normal. Joints normal . Nail health good. Able to lie down & sit up w/o help. Negative SLR bilaterally Vascular: Carotid, radial artery, dorsalis pedis and  posterior tibial pulses are full and equal. Faint aortic bruit present w/o AAA. Neurologic: Alert and oriented x3. Deep tendon reflexes symmetrical and normal.         Skin: Intact without suspicious lesions or rashes. Lymph: No cervical, axillary lymphadenopathy present. Psych: Mood and affect are normal. Normally interactive  Assessment & Plan:  #1 Medicare Wellness Exam; criteria met ; data entered #2 Problem List/Diagnoses reviewed #3 ? L2-3  Positional  radiculopathy Plan:  Assessments made/ Orders entered

## 2012-12-15 NOTE — Telephone Encounter (Signed)
Potassium level was 2.7. Pt notified that she would need to start taking Potassium 20 mEq twice daily for 5 days then one daily. Repeat labs in one week. Pt verbalized understanding.

## 2012-12-16 ENCOUNTER — Other Ambulatory Visit: Payer: Self-pay | Admitting: Internal Medicine

## 2012-12-16 DIAGNOSIS — E782 Mixed hyperlipidemia: Secondary | ICD-10-CM | POA: Insufficient documentation

## 2012-12-16 DIAGNOSIS — E876 Hypokalemia: Secondary | ICD-10-CM | POA: Insufficient documentation

## 2012-12-18 ENCOUNTER — Encounter: Payer: Self-pay | Admitting: *Deleted

## 2012-12-18 ENCOUNTER — Telehealth: Payer: Self-pay | Admitting: *Deleted

## 2012-12-18 NOTE — Progress Notes (Signed)
Letter mailed to patient.

## 2012-12-18 NOTE — Telephone Encounter (Signed)
Message copied by Verdie Shire on Mon Dec 18, 2012  5:42 PM ------      Message from: Pecola Lawless      Created: Mon Dec 18, 2012 12:31 PM       Please verify appt Fri & on K+ orally. Thanks, Hopp      ----- Message -----         From: Pecola Lawless, MD         Sent: 12/16/2012   6:53 PM           To: Pecola Lawless, MD                   ------

## 2012-12-18 NOTE — Telephone Encounter (Signed)
Spoke with the pt and a lab appt was scheduled for Friday (12-22-12 @ 8:45am).//AB/CMA

## 2012-12-18 NOTE — Telephone Encounter (Signed)
Message copied by Verdie Shire on Mon Dec 18, 2012  5:35 PM ------      Message from: Pecola Lawless      Created: Sat Dec 16, 2012  6:50 PM       Please  Remind her of fasting Labs 12/22/12: K+ & NMR Lipoprofie  ------

## 2012-12-18 NOTE — Telephone Encounter (Signed)
Spoke with the pt and she was scheduled a lab appt, and she started the K+ on Sat.//AB/CMA

## 2012-12-20 ENCOUNTER — Ambulatory Visit: Payer: Medicare Other

## 2012-12-20 DIAGNOSIS — R7309 Other abnormal glucose: Secondary | ICD-10-CM

## 2012-12-20 LAB — HEMOGLOBIN A1C: Hgb A1c MFr Bld: 6 % (ref 4.6–6.5)

## 2012-12-22 ENCOUNTER — Other Ambulatory Visit (INDEPENDENT_AMBULATORY_CARE_PROVIDER_SITE_OTHER): Payer: Medicare Other

## 2012-12-22 ENCOUNTER — Other Ambulatory Visit: Payer: Self-pay | Admitting: Internal Medicine

## 2012-12-22 DIAGNOSIS — E782 Mixed hyperlipidemia: Secondary | ICD-10-CM

## 2012-12-22 DIAGNOSIS — E876 Hypokalemia: Secondary | ICD-10-CM

## 2012-12-22 LAB — POTASSIUM: Potassium: 3.9 mEq/L (ref 3.5–5.1)

## 2012-12-25 ENCOUNTER — Encounter: Payer: Self-pay | Admitting: Internal Medicine

## 2012-12-25 LAB — NMR LIPOPROFILE WITH LIPIDS
Cholesterol, Total: 252 mg/dL — ABNORMAL HIGH (ref <200)
HDL Particle Number: 38.1 umol/L (ref >=30.5)
LDL (calc): 162 mg/dL — ABNORMAL HIGH (ref <100)
LDL Size: 20.1 nm — ABNORMAL LOW (ref >20.5)
Large HDL-P: 5.5 umol/L (ref >=4.8)
Large VLDL-P: 5.8 nmol/L — ABNORMAL HIGH (ref <=2.7)
Small LDL Particle Number: 2225 nmol/L — ABNORMAL HIGH (ref <=527)

## 2013-01-01 ENCOUNTER — Encounter (INDEPENDENT_AMBULATORY_CARE_PROVIDER_SITE_OTHER): Payer: Medicare Other | Admitting: Ophthalmology

## 2013-01-01 DIAGNOSIS — H43819 Vitreous degeneration, unspecified eye: Secondary | ICD-10-CM

## 2013-01-01 DIAGNOSIS — I10 Essential (primary) hypertension: Secondary | ICD-10-CM

## 2013-01-01 DIAGNOSIS — H35039 Hypertensive retinopathy, unspecified eye: Secondary | ICD-10-CM

## 2013-01-01 DIAGNOSIS — H2510 Age-related nuclear cataract, unspecified eye: Secondary | ICD-10-CM

## 2013-01-01 DIAGNOSIS — H3530 Unspecified macular degeneration: Secondary | ICD-10-CM

## 2013-01-03 ENCOUNTER — Other Ambulatory Visit: Payer: Self-pay | Admitting: Family Medicine

## 2013-01-15 ENCOUNTER — Encounter: Payer: Self-pay | Admitting: Physical Medicine and Rehabilitation

## 2013-01-15 ENCOUNTER — Encounter
Payer: Medicare Other | Attending: Physical Medicine and Rehabilitation | Admitting: Physical Medicine and Rehabilitation

## 2013-01-15 VITALS — BP 143/67 | HR 88 | Resp 14 | Ht 63.0 in | Wt 168.2 lb

## 2013-01-15 DIAGNOSIS — R51 Headache: Secondary | ICD-10-CM | POA: Diagnosis not present

## 2013-01-15 DIAGNOSIS — S0450XS Injury of facial nerve, unspecified side, sequela: Secondary | ICD-10-CM

## 2013-01-15 DIAGNOSIS — S0452XS Injury of facial nerve, left side, sequela: Secondary | ICD-10-CM

## 2013-01-15 DIAGNOSIS — G500 Trigeminal neuralgia: Secondary | ICD-10-CM | POA: Diagnosis not present

## 2013-01-15 DIAGNOSIS — D333 Benign neoplasm of cranial nerves: Secondary | ICD-10-CM | POA: Insufficient documentation

## 2013-01-15 DIAGNOSIS — G8929 Other chronic pain: Secondary | ICD-10-CM | POA: Insufficient documentation

## 2013-01-15 DIAGNOSIS — H3530 Unspecified macular degeneration: Secondary | ICD-10-CM | POA: Insufficient documentation

## 2013-01-15 DIAGNOSIS — M542 Cervicalgia: Secondary | ICD-10-CM | POA: Diagnosis not present

## 2013-01-15 DIAGNOSIS — S049XXS Injury of unspecified cranial nerve, sequela: Secondary | ICD-10-CM

## 2013-01-15 MED ORDER — METHADONE HCL 10 MG PO TABS: 5.0000 mg | ORAL_TABLET | Freq: Two times a day (BID) | ORAL | Status: DC

## 2013-01-15 MED ORDER — TAPENTADOL HCL 50 MG PO TABS: 50.0000 mg | ORAL_TABLET | Freq: Two times a day (BID) | ORAL | Status: DC | PRN

## 2013-01-15 NOTE — Progress Notes (Signed)
Subjective:    Patient ID: [name], female    DOB: 06-17-1945, 67 y.o.   MRN: [id number]  HPI Von is back regarding her chronic facial pain. She saw her eye doctor who rx'ed her corneal irritation/scleral irritation. He also found macular degeneration.  Her pain levels are stable. She still remains sensitive to touch on the left side of her face. Her medications remain fairly effective. She reports that she feels considerably well.  She has followed up with Dr. Wynetta Emery regarding her schwannoma. She has had a new MRI done in June 2013, which did not show any changes. He can not do anything for her at this point. Dr. Wynetta Emery suggested that she should follow up with her surgeon Dr. Andrey Campanile at [facility], which she did, he stated her situation is stable at this point.  Pain Inventory Average Pain 6 Pain Right Now 6 My pain is sharp, burning and stabbing  In the last 24 hours, has pain interfered with the following? General activity 7 Relation with others 9 Enjoyment of life 9 What TIME of day is your pain at its worst? morning, daytime Sleep (in general) Good  Pain is worse with: talking Pain improves with: rest, heat/ice and medication Relief from Meds: 8  Mobility walk without assistance ability to climb steps?  yes transfers alone Do you have any goals in this area?  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  no  Neuro/Psych numbness tingling loss of taste or smell  Prior Studies Any changes since last visit?  no  Physicians involved in your care Any changes since last visit?  no   Family History  Problem Relation Age of Onset  . Hypertension Mother   . Transient ischemic attack Mother   . Coronary artery disease Father     CHF  . Transient ischemic attack Maternal Grandmother   . Diabetes Neg  Hx   . Cancer Neg Hx    History   Social History  . Marital Status: Married    Spouse Name: N/A    Number of Children: N/A  . Years of Education: N/A   Social History Main Topics  . Smoking status: Never Smoker   . Smokeless tobacco: Never Used  . Alcohol Use: No  . Drug Use: None  . Sexual Activity: None   Other Topics Concern  . None   Social History Narrative  . None   Past Surgical History  Procedure Laterality Date  . Abdominal hysterectomy    . No colonoscopy      "just me"  . Lumbar disc surgery      Dr Renae Fickle   Past Medical History  Diagnosis Date  . Trigeminal neuralgia   . Abnormality of gait   . Facial nerve disorder   . Myofascial pain   . Cervicalgia   . Brachial neuritis or radiculitis NOS    BP 143/67  Pulse 88  Resp 14  Ht 5\' 3"  (1.6 m)  Wt 168 lb 3.2 oz (76.295 kg)  BMI 29.80 kg/m2  SpO2 97%     Review of Systems  HENT:       Loss of taste or smell  Cardiovascular: Positive for leg swelling.  Genitourinary: Positive for decreased urine volume.  Neurological: Positive for numbness.       Tingling  Objective:   Physical Exam Constitutional: She is oriented to person, place, and time. She appears well-developed and well-nourished.  HENT:  Head: Normocephalic and atraumatic.  Cardiovascular: Normal rate and regular rhythm.  Pulmonary/Chest: Effort normal. No respiratory distress. She has no wheezes.  Abdominal: Bowel sounds are normal.  Musculoskeletal: Normal range of motion.  Neurological: She is alert and oriented to person, place, and time.  Left lids do not close completely and she has left facial droop. Left face remains hypersenstiive to touch.  Psychiatric: She has a normal mood and affect. Her behavior is normal. Judgment and thought content normal.        Assessment & Plan:  1. History of vestibular schwannoma resulting in trigeminal neuralgia and left facial nerve injury  2. History of cervicalgia  3. Chronic  pain.  4.4.Venous blood collection behind left eye which appears new since MRI from 2011,  Otherwise stable MRI in 2013, compared to the MRI from 2011  PLAN:  1. I refilled methadone 10 mg 1 p.o. B.i.d., # 60 ,1 Rx is enough for 2 month  2. Nucynta 50 mg 1 p.o. q.12 h. p.r.n. 30 with no refill,1 Rx is enough for 2 month.  4. Continue with safety measures at home and with gait.  5. Continue compounded cream of 20% ketoprofen, 5% elavil, 4% ketamine, and 10% gabapentin.  6. Encouraged her to continue with her walking program  She'll follow up in 2 months.

## 2013-01-15 NOTE — Patient Instructions (Signed)
Continue with staying as active as tolerated 

## 2013-02-01 ENCOUNTER — Other Ambulatory Visit: Payer: Self-pay | Admitting: Family Medicine

## 2013-03-07 DIAGNOSIS — I10 Essential (primary) hypertension: Secondary | ICD-10-CM | POA: Diagnosis not present

## 2013-03-07 DIAGNOSIS — F3289 Other specified depressive episodes: Secondary | ICD-10-CM | POA: Diagnosis not present

## 2013-03-07 DIAGNOSIS — F411 Generalized anxiety disorder: Secondary | ICD-10-CM | POA: Diagnosis not present

## 2013-03-07 DIAGNOSIS — R5381 Other malaise: Secondary | ICD-10-CM | POA: Diagnosis not present

## 2013-03-07 DIAGNOSIS — E559 Vitamin D deficiency, unspecified: Secondary | ICD-10-CM | POA: Diagnosis not present

## 2013-03-19 ENCOUNTER — Encounter: Payer: Self-pay | Admitting: Physical Medicine & Rehabilitation

## 2013-03-19 ENCOUNTER — Encounter: Payer: Medicare Other | Attending: Physical Medicine and Rehabilitation | Admitting: Physical Medicine & Rehabilitation

## 2013-03-19 VITALS — BP 137/66 | HR 81 | Resp 14 | Ht 63.0 in | Wt 167.0 lb

## 2013-03-19 DIAGNOSIS — R51 Headache: Secondary | ICD-10-CM

## 2013-03-19 DIAGNOSIS — G500 Trigeminal neuralgia: Secondary | ICD-10-CM | POA: Insufficient documentation

## 2013-03-19 DIAGNOSIS — D333 Benign neoplasm of cranial nerves: Secondary | ICD-10-CM | POA: Diagnosis not present

## 2013-03-19 DIAGNOSIS — S0450XA Injury of facial nerve, unspecified side, initial encounter: Secondary | ICD-10-CM | POA: Diagnosis not present

## 2013-03-19 MED ORDER — METHADONE HCL 10 MG PO TABS
5.0000 mg | ORAL_TABLET | Freq: Two times a day (BID) | ORAL | Status: DC
Start: 2013-03-19 — End: 2013-05-23

## 2013-03-19 MED ORDER — METHADONE HCL 10 MG PO TABS
5.0000 mg | ORAL_TABLET | Freq: Two times a day (BID) | ORAL | Status: DC
Start: 2013-03-19 — End: 2013-03-19

## 2013-03-19 NOTE — Progress Notes (Signed)
Subjective:    Patient ID: [name], female    DOB: [birth year], 68 y.o.   MRN: [id number]  HPI  [name] is back regarding her chronic facial pain/vestibular schwannoma. Her pain has remained fairly stable. She developed some pain in her left eye in the Fall which has seemed to improve since then.   She still is having some waxing and waning of her vestibular symptoms--they be a little more frequent over the last couple months. She feels that her fatigue is worse. She does have some problem with sleep.   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.      Pain Inventory Average Pain 5 Pain Right Now 3 My pain is burning, stabbing and tingling  In the last 24 hours, has pain interfered with the following? General activity 5 Relation with others 8 Enjoyment of life 8 What TIME of day is your pain at its worst? daytime, evening Sleep (in general) Good  Pain is worse with: talking Pain improves with: rest, heat/ice and medication Relief from Meds: 6  Mobility walk without assistance transfers alone Do you have any goals in this area?  no  Function employed # of hrs/week 16 retired I need assistance with the following:  household duties and shopping Do you have any goals in this area?  no  Neuro/Psych weakness numbness tingling dizziness anxiety loss of taste or smell  Prior Studies Any changes since last visit?  no  Physicians involved in your care Primary care [place] [phone]   Family History  Problem Relation Age of Onset  . Hypertension Mother   . Transient ischemic attack Mother   . Coronary artery disease Father     CHF  . Transient ischemic attack Maternal Grandmother   . Diabetes Neg Hx   . Cancer Neg Hx    History   Social History  . Marital Status: Married    Spouse Name: N/A    Number of Children: N/A  . Years of Education: N/A   Social History Main Topics  .  Smoking status: Never Smoker   . Smokeless tobacco: Never Used  . Alcohol Use: No  . Drug Use: None  . Sexual Activity: None   Other Topics Concern  . None   Social History Narrative  . None   Past Surgical History  Procedure Laterality Date  . Abdominal hysterectomy    . No colonoscopy      "just me"  . Lumbar disc surgery      Dr Eddie Dibbles   Past Medical History  Diagnosis Date  . Trigeminal neuralgia   . Abnormality of gait   . Facial nerve disorder   . Myofascial pain   . Cervicalgia   . Brachial neuritis or radiculitis NOS    BP 137/66  Pulse 81  Resp 14  Ht 5\' 3"  (1.6 m)  Wt 167 lb (75.751 kg)  BMI 29.59 kg/m2  SpO2 96%  Opioid Risk Score: 5 Fall Risk Score: Moderate Fall Risk (6-13 points) (pt educated on fall risk, brochure given to pt.)    Review of Systems  HENT:       Loss of taste   Gastrointestinal: Positive for constipation.  Genitourinary: Positive for decreased urine volume.  Neurological: Positive for dizziness, weakness and numbness.       Tingling   Psychiatric/Behavioral: The patient is nervous/anxious.   All other systems reviewed and are negative.  Objective:   Physical Exam  HENT:  Head: Normocephalic and atraumatic.  Eyes: Conjunctivae and EOM are normal. Pupils are equal, round, and reactive to light.  Cardiovascular: Normal rate and regular rhythm.  Pulmonary/Chest: Effort normal. No respiratory distress. She has no wheezes.  Abdominal: Bowel sounds are normal.  Musculoskeletal: Normal range of motion.  Neurological: She is alert and oriented to person, place, and time. Balance still off, with occasional ataxia noted.  Left lids do not close completely and she has left facial droop. Left face remains hypersenstiive to touch.  Psychiatric: She has a normal mood and affect. Her behavior is normal. Judgment and thought content normal.   Assessment & Plan:   ASSESSMENT:  1. History of vestibular schwannoma resulting in  trigeminal neuralgia and left facial nerve injury- no tumor regrowth per NS  2. History of cervicalgia  3. Chronic pain.  4.Venous blood collection behind left eye which appears new since the MRI from 2011    PLAN:  1. I refilled methadone 10 mg 1.5-1 p.o. b.i.d., 75 with refill for next month. Consider Nucynta ER 100mg  qday also as a back up if methadone becomes ineffective.  2. Nucynta 50 mg 1 p.o. q.12 h. p.r.n. 30 with no refill.  4. Discussed increasing exercise. That should help energy levels and activity tolerance. 5. Continue use of an eye patch.  Eye gtts during the day. Discussed goggle wear potentially.  6. Continued compounded cream of 20% ketoprofen, 5% elavil, 4% ketamine, and 10% gabapentin.  7. She'll follow up with my PA or nurse in 2 months. I will see her back in June when we'll order another follow up MRI of her brain given that she has experienced some progression in fatigue and ongoing balance issues.

## 2013-03-19 NOTE — Patient Instructions (Signed)
PLEASE CALL ME WITH ANY PROBLEMS OR QUESTIONS (#[phone]).      

## 2013-05-23 ENCOUNTER — Encounter: Payer: Self-pay | Admitting: Physical Medicine & Rehabilitation

## 2013-05-23 ENCOUNTER — Encounter: Payer: Medicare Other | Attending: Physical Medicine and Rehabilitation | Admitting: Physical Medicine & Rehabilitation

## 2013-05-23 VITALS — BP 139/70 | HR 91 | Resp 14 | Ht 63.0 in | Wt 167.0 lb

## 2013-05-23 DIAGNOSIS — S0450XA Injury of facial nerve, unspecified side, initial encounter: Secondary | ICD-10-CM | POA: Diagnosis not present

## 2013-05-23 DIAGNOSIS — G500 Trigeminal neuralgia: Secondary | ICD-10-CM | POA: Insufficient documentation

## 2013-05-23 DIAGNOSIS — D333 Benign neoplasm of cranial nerves: Secondary | ICD-10-CM | POA: Diagnosis not present

## 2013-05-23 MED ORDER — METHADONE HCL 10 MG PO TABS: 5.0000 mg | ORAL_TABLET | Freq: Two times a day (BID) | ORAL | Status: DC

## 2013-05-23 MED ORDER — TAPENTADOL HCL 50 MG PO TABS: 50.0000 mg | ORAL_TABLET | Freq: Two times a day (BID) | ORAL | Status: DC | PRN

## 2013-05-23 NOTE — Progress Notes (Signed)
Subjective:    Patient ID: [name], female    DOB: 14-Mar-1945, 68 y.o.   MRN: [id number]  HPI  [name] is back regarding her chronic pain and vestibular issues. Her vision is still blurry which affects her balance further. She sees optho for her vision as well.  She has had to use a few more nucynta currently due to some flares in her pain.   She remains on the methadone as rx'ed.  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    Pain Inventory Average Pain 7 Pain Right Now 7 My pain is constant, sharp, burning and stabbing  In the last 24 hours, has pain interfered with the following? General activity 8 Relation with others 10 Enjoyment of life 10 What TIME of day is your pain at its worst? morning, daytime Sleep (in general) Good  Pain is worse with: inactivity and talking Pain improves with: rest, heat/ice and medication Relief from Meds: 8  Mobility walk without assistance ability to climb steps?  yes do you drive?  yes transfers alone Do you have any goals in this area?  no  Function employed # of hrs/week 8-12 disabled: date disabled na retired I need assistance with the following:  household duties and shopping Do you have any goals in this area?  no  Neuro/Psych weakness numbness tingling trouble walking dizziness anxiety loss of taste or smell  Prior Studies Any changes since last visit?  no  Physicians involved in your care Any changes since last visit?  no   Family History  Problem Relation Age of Onset  . Hypertension Mother   . Transient ischemic attack Mother   . Coronary artery disease Father     CHF  . Transient ischemic attack Maternal Grandmother   . Diabetes Neg Hx   . Cancer Neg Hx    History   Social History  . Marital Status: Married    Spouse Name: N/A    Number of Children: N/A  . Years of Education: N/A   Social History Main Topics  . Smoking status: Never Smoker   . Smokeless tobacco: Never Used    . Alcohol Use: No  . Drug Use: None  . Sexual Activity: None   Other Topics Concern  . None   Social History Narrative  . None   Past Surgical History  Procedure Laterality Date  . Abdominal hysterectomy    . No colonoscopy      "just me"  . Lumbar disc surgery      Dr Eddie Dibbles   Past Medical History  Diagnosis Date  . Trigeminal neuralgia   . Abnormality of gait   . Facial nerve disorder   . Myofascial pain   . Cervicalgia   . Brachial neuritis or radiculitis NOS    BP 139/70  Pulse 91  Resp 14  Ht 5\' 3"  (1.6 m)  Wt 167 lb (75.751 kg)  BMI 29.59 kg/m2  SpO2 98%  Opioid Risk Score:   Fall Risk Score: Moderate Fall Risk (6-13 points) (pt educated and given brochure on fall risk previously)    Review of Systems  Constitutional: Positive for unexpected weight change.  HENT:       Loss of smell taste  Cardiovascular: Positive for leg swelling.  Gastrointestinal: Positive for constipation.  Genitourinary: Positive for decreased urine volume.  Musculoskeletal: Positive for gait problem.  Neurological: Positive for dizziness, weakness and numbness.       Tingling  Psychiatric/Behavioral: The  patient is nervous/anxious.   All other systems reviewed and are negative.      Objective:   Physical Exam  HENT:  Head: Normocephalic and atraumatic.  Eyes: Conjunctivae and EOM are normal. Pupils are equal, round, and reactive to light.  Cardiovascular: Normal rate and regular rhythm.  Pulmonary/Chest: Effort normal. No respiratory distress. She has no wheezes.  Abdominal: Bowel sounds are normal.  Musculoskeletal: Normal range of motion.  Neurological: She is alert and oriented to person, place, and time. Balance still off, with occasional ataxia noted.  Left lid still doed not close completely and she has left facial droop. Left face remains hypersenstiive to touch.  Psychiatric: She has a normal mood and affect. Her behavior is normal. Judgment and thought content  normal.    Assessment & Plan:   ASSESSMENT:  1. History of vestibular schwannoma resulting in trigeminal neuralgia and left facial nerve injury- no tumor regrowth per NS  2. History of cervicalgia  3. Chronic pain.  4.Venous blood collection behind left eye which appears new since the MRI from 2011    PLAN:  1. I refilled methadone 10 mg 1.5-1 p.o. b.i.d., 75 with refill for next month. Consider Nucynta ER 100mg  qday also as a back up if methadone becomes ineffective.  2. Nucynta 50 mg 1 p.o. q.12 h. p.r.n. 30 with no refill.  4. Discussed increasing exercise. That should help energy levels and activity tolerance.  5. Discussed cold level laser therapy as an option. Discussed some local options. She may even be able to buy a home unit. 6. Continued compounded cream of 20% ketoprofen, 5% elavil, 4% ketamine, and 10% gabapentin.  7. She'll follow up with me in 2 months. I will see her back in June when we'll order another follow up MRI of her brain given that she has experienced some progression in fatigue and ongoing balance issues.

## 2013-05-23 NOTE — Patient Instructions (Signed)
COLD LEVEL LASER THERAPY MAY BE AN OPTION FOR YOUR FACE---TAKE A LOOK ON LINE AND SEE WHAT YOU THINK

## 2013-07-23 ENCOUNTER — Encounter: Payer: Self-pay | Admitting: Physical Medicine & Rehabilitation

## 2013-07-23 ENCOUNTER — Encounter: Payer: Medicare Other | Attending: Physical Medicine and Rehabilitation | Admitting: Physical Medicine & Rehabilitation

## 2013-07-23 DIAGNOSIS — R51 Headache: Secondary | ICD-10-CM | POA: Insufficient documentation

## 2013-07-23 DIAGNOSIS — X58XXXA Exposure to other specified factors, initial encounter: Secondary | ICD-10-CM | POA: Insufficient documentation

## 2013-07-23 DIAGNOSIS — G500 Trigeminal neuralgia: Secondary | ICD-10-CM | POA: Insufficient documentation

## 2013-07-23 DIAGNOSIS — S0450XA Injury of facial nerve, unspecified side, initial encounter: Secondary | ICD-10-CM | POA: Diagnosis not present

## 2013-07-23 DIAGNOSIS — Z5189 Encounter for other specified aftercare: Secondary | ICD-10-CM | POA: Diagnosis not present

## 2013-07-23 DIAGNOSIS — D333 Benign neoplasm of cranial nerves: Secondary | ICD-10-CM | POA: Insufficient documentation

## 2013-07-23 MED ORDER — METHADONE HCL 10 MG PO TABS: 5.0000 mg | ORAL_TABLET | Freq: Two times a day (BID) | ORAL | Status: DC

## 2013-07-23 MED ORDER — TAPENTADOL HCL 50 MG PO TABS: 50.0000 mg | ORAL_TABLET | Freq: Two times a day (BID) | ORAL | Status: DC | PRN

## 2013-07-23 NOTE — Progress Notes (Signed)
Subjective:    Patient ID: [name], female    DOB: 1945/10/04, 68 y.o.   MRN: [id number]  HPI  Sharalyn is back regarding her chronic pain. She has been doing fairly well. She has been enjoying working in her garden and doing some work. She is working less and is thinking about quitting entirely.   She maintains on her nucynta and methadone for pain control. Her pain levels have been stable to improve as well.   She follows up with optho later this summer regarding her macular degeneration. She also has some catarcts that may need to attention.   Her dizziness and equilibrium still give her some trouble. She has compensated fairly well. Her last MRI of the brain was from 2013. We had discussed doing another follow up study this summer.    Pain Inventory Average Pain 5 Pain Right Now 3 My pain is constant, burning, stabbing and tingling  In the last 24 hours, has pain interfered with the following? General activity 7 Relation with others 9 Enjoyment of life 10 What TIME of day is your pain at its worst? daytime and evening Sleep (in general) Good  Pain is worse with: some activites and talking Pain improves with: medication Relief from Meds: 8  Mobility walk without assistance ability to climb steps?  yes do you drive?  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  no  Physicians involved in your care Any changes since last visit?  no   Family History  Problem Relation Age of Onset  . Hypertension Mother   . Transient ischemic attack Mother   . Coronary artery disease Father     CHF  . Transient ischemic attack Maternal Grandmother   . Diabetes Neg Hx   . Cancer Neg Hx    History   Social History  . Marital Status: Married    Spouse Name: N/A    Number of Children: N/A  . Years of Education: N/A   Social History Main Topics  . Smoking status: Never Smoker   . Smokeless tobacco: Never Used  . Alcohol Use: No  . Drug Use: None  . Sexual Activity: None   Other Topics Concern  . None   Social History Narrative  . None   Past Surgical History  Procedure Laterality Date  . Abdominal hysterectomy    . No colonoscopy      "just me"  . Lumbar disc surgery      Dr Eddie Dibbles   Past Medical History  Diagnosis Date  . Trigeminal neuralgia   . Abnormality of gait   . Facial nerve disorder   . Myofascial pain   . Cervicalgia   . Brachial neuritis or radiculitis NOS    There were no vitals taken for this visit.  Opioid Risk Score:   Fall Risk Score: Moderate Fall Risk (6-13 points) (educated and handout given for fall prevention in the home previously) Review of Systems  HENT:       Lost of taste and smell  Cardiovascular: Positive for leg swelling.  Gastrointestinal: Positive for constipation.  Genitourinary: Positive for difficulty urinating.  Musculoskeletal: Positive for gait problem.  Neurological: Positive for numbness.       Tingling  Psychiatric/Behavioral: The patient is nervous/anxious.   All other  systems reviewed and are negative.      Objective:   Physical Exam  HENT:  Head: Normocephalic and atraumatic.  Eyes: Conjunctivae and EOM are normal. Pupils are equal, round, and reactive to light.  Cardiovascular: Normal rate and regular rhythm.  Pulmonary/Chest: Effort normal. No respiratory distress. She has no wheezes.  Abdominal: Bowel sounds are normal.  Musculoskeletal: Normal range of motion.  Neurological: She is alert and oriented to person, place, and time. Balance still off, with occasional ataxia noted.  Left lid still doed not close completely and she has left facial droop. Left face remains hypersenstiive to touch.  Psychiatric: She has a normal mood and affect. Her behavior is normal. Judgment  and thought content normal.   Assessment & Plan:   ASSESSMENT:  1. History of vestibular schwannoma resulting in trigeminal neuralgia and left facial nerve injury- no tumor regrowth per NS  2. History of cervicalgia  3. Chronic pain.  4.Venous blood collection behind left eye which appears new since the MRI from 2011    PLAN:  1. I refilled methadone 10 mg 1.5-1 p.o. b.i.d., 75 with refill for next month.  2. Nucynta 50 mg 1 p.o. q.12 h. p.r.n. 30 with no refill.  4. Ordered an MRI of the brain with and without to follow up her schwannoma for any potential progression. 5. Continued compounded cream of 20% ketoprofen, 5% elavil, 4% ketamine, and 10% gabapentin.  6. She'll follow up with me in 2 months. 15 minutes of face to face patient care time were spent during this visit. All questions were encouraged and answered.

## 2013-07-23 NOTE — Patient Instructions (Signed)
PLEASE CALL ME WITH ANY PROBLEMS OR QUESTIONS (#[phone]).      

## 2013-07-25 ENCOUNTER — Ambulatory Visit (INDEPENDENT_AMBULATORY_CARE_PROVIDER_SITE_OTHER): Payer: Medicare Other | Admitting: Ophthalmology

## 2013-07-25 DIAGNOSIS — I10 Essential (primary) hypertension: Secondary | ICD-10-CM

## 2013-07-25 DIAGNOSIS — H35039 Hypertensive retinopathy, unspecified eye: Secondary | ICD-10-CM

## 2013-07-25 DIAGNOSIS — H43819 Vitreous degeneration, unspecified eye: Secondary | ICD-10-CM | POA: Diagnosis not present

## 2013-07-25 DIAGNOSIS — H3530 Unspecified macular degeneration: Secondary | ICD-10-CM

## 2013-07-25 DIAGNOSIS — H2510 Age-related nuclear cataract, unspecified eye: Secondary | ICD-10-CM

## 2013-08-06 ENCOUNTER — Ambulatory Visit
Admission: RE | Admit: 2013-08-06 | Discharge: 2013-08-06 | Disposition: A | Discharge status: Home / Self Care | Payer: Medicare Other | Source: Ambulatory Visit | Attending: Physical Medicine & Rehabilitation | Admitting: Physical Medicine & Rehabilitation

## 2013-08-06 DIAGNOSIS — R51 Headache: Secondary | ICD-10-CM

## 2013-08-06 DIAGNOSIS — S0450XA Injury of facial nerve, unspecified side, initial encounter: Secondary | ICD-10-CM

## 2013-08-06 DIAGNOSIS — G500 Trigeminal neuralgia: Secondary | ICD-10-CM

## 2013-08-06 DIAGNOSIS — D210 Benign neoplasm of connective and other soft tissue of head, face and neck: Secondary | ICD-10-CM | POA: Diagnosis not present

## 2013-08-06 DIAGNOSIS — D333 Benign neoplasm of cranial nerves: Secondary | ICD-10-CM

## 2013-08-06 MED ORDER — GADOBENATE DIMEGLUMINE 529 MG/ML IV SOLN
15.0000 mL | Freq: Once | INTRAVENOUS | Status: AC | PRN
  Administered 2013-08-06: 15 mL via INTRAVENOUS

## 2013-08-08 ENCOUNTER — Telehealth: Payer: Self-pay | Admitting: Physical Medicine & Rehabilitation

## 2013-10-08 ENCOUNTER — Encounter: Payer: Self-pay | Admitting: Physical Medicine & Rehabilitation

## 2013-10-08 ENCOUNTER — Encounter: Payer: Medicare Other | Attending: Physical Medicine and Rehabilitation | Admitting: Physical Medicine & Rehabilitation

## 2013-10-08 VITALS — BP 123/64 | HR 78 | Resp 14 | Wt 166.6 lb

## 2013-10-08 DIAGNOSIS — S0450XA Injury of facial nerve, unspecified side, initial encounter: Secondary | ICD-10-CM | POA: Diagnosis not present

## 2013-10-08 DIAGNOSIS — Z5189 Encounter for other specified aftercare: Secondary | ICD-10-CM | POA: Diagnosis not present

## 2013-10-08 DIAGNOSIS — D333 Benign neoplasm of cranial nerves: Secondary | ICD-10-CM | POA: Diagnosis not present

## 2013-10-08 DIAGNOSIS — X58XXXA Exposure to other specified factors, initial encounter: Secondary | ICD-10-CM | POA: Diagnosis not present

## 2013-10-08 DIAGNOSIS — G500 Trigeminal neuralgia: Secondary | ICD-10-CM | POA: Insufficient documentation

## 2013-10-08 DIAGNOSIS — S0452XD Injury of facial nerve, left side, subsequent encounter: Secondary | ICD-10-CM

## 2013-10-08 MED ORDER — TAPENTADOL HCL 50 MG PO TABS: 50.0000 mg | ORAL_TABLET | Freq: Two times a day (BID) | ORAL | Status: DC | PRN

## 2013-10-08 MED ORDER — METHADONE HCL 10 MG PO TABS: 5.0000 mg | ORAL_TABLET | Freq: Two times a day (BID) | ORAL | Status: DC

## 2013-10-08 NOTE — Progress Notes (Signed)
Subjective:    Patient ID: [name], female    DOB: 1945-05-04, 68 y.o.   MRN: [id number]  HPI  [name] is back regarding her schwannoma and associated pain. She just returned from a relaxing week at the beach. She has officially retired from her job and decided that she wants to relax for awhile.   We reviewed her MRI from June which revealed:   IMPRESSION:  No change since multiple previous examinations. Stable enhancing  material within the left internal auditory canal consistent with  residual schwannoma and or fibrosis.  Post surgical atrophy and gliosis of the left cerebellum and the  right frontal lobe.  No visible normal seventh or eighth nerve on the left. Chronic  atrophy of the left fifth nerve   Her pain levels are consistent/unchanged. The pain may last a little longer.     Pain Inventory Average Pain 6 Pain Right Now 4 My pain is sharp, burning, stabbing and tingling  In the last 24 hours, has pain interfered with the following? General activity 7 Relation with others 8 Enjoyment of life 9 What TIME of day is your pain at its worst? daytime and evening Sleep (in general) Good  Pain is worse with: talking Pain improves with: rest, heat/ice and medication Relief from Meds: 8  Mobility walk without assistance ability to climb steps?  yes do you drive?  yes  Function disabled: date disabled 2005 retired I need assistance with the following:  household duties and shopping  Neuro/Psych weakness tingling trouble walking anxiety loss of taste or smell  Prior Studies Any changes since last visit?  no  Physicians involved in your care Any changes since last visit?  no   Family History  Problem Relation Age of Onset  . Hypertension Mother   . Transient ischemic attack Mother   . Coronary artery disease Father     CHF  . Transient ischemic attack Maternal Grandmother   . Diabetes Neg Hx   . Cancer Neg Hx    History   Social  History  . Marital Status: Married    Spouse Name: N/A    Number of Children: N/A  . Years of Education: N/A   Social History Main Topics  . Smoking status: Never Smoker   . Smokeless tobacco: Never Used  . Alcohol Use: No  . Drug Use: None  . Sexual Activity: None   Other Topics Concern  . None   Social History Narrative  . None   Past Surgical History  Procedure Laterality Date  . Abdominal hysterectomy    . No colonoscopy      "just me"  . Lumbar disc surgery      Dr [name]   Past Medical History  Diagnosis Date  . Trigeminal neuralgia   . Abnormality of gait   . Facial nerve disorder   . Myofascial pain   . Cervicalgia   . Brachial neuritis or radiculitis NOS    BP 123/64  Pulse 78  Resp 14  Wt 166 lb 9.6 oz (75.569 kg)  SpO2 98%  Opioid Risk Score:   Fall Risk Score: Moderate Fall Risk (6-13 points) (peviously educated and given handout)  Review of Systems  Constitutional:       Loss of taste and smell  Musculoskeletal: Positive for gait problem.  Neurological: Positive for weakness.       Tingling  Psychiatric/Behavioral: The patient is nervous/anxious.   All other systems reviewed and are negative.  Objective:   Physical Exam   HENT:  Head: Normocephalic and atraumatic.  Eyes: Conjunctivae and EOM are normal. Pupils are equal, round, and reactive to light.  Cardiovascular: Normal rate and regular rhythm.  Pulmonary/Chest: Effort normal. No respiratory distress. She has no wheezes.  Abdominal: Bowel sounds are normal.  Musculoskeletal: Normal range of motion.  Neurological: She is alert and oriented to person, place, and time. Balance still off, with occasional ataxia noted.  Left lid still doe not close completely and she has persistent left facial droop. Left face remains somewhat hypersenstiive to touch.  Psychiatric: She has a normal mood and affect. Her behavior is normal. Judgment and thought content normal.      Assessment &  Plan:   ASSESSMENT:  1. History of vestibular schwannoma resulting in trigeminal neuralgia and left facial nerve injury- no tumor regrowth per MRI--has chronic peri-operative scarring and atrophy which accounts for weakness, pain, and balance issues  2. History of cervicalgia  3. Chronic pain related to CN 5 involvement     PLAN:  1. I refilled methadone 10 mg 1.5-1 p.o. b.i.d., 75  for next month.   2. Nucynta 50 mg 1 p.o. q.12 h. p.r.n. 30 with no refill for next month. Consider Nucynta ER as well.  4. Would like for her to get re-started on a HEP to improve strength and balance---aquatic exercises would be ideal.  5. Continued compounded cream of 20% ketoprofen, 5% elavil, 4% ketamine, and 10% gabapentin. She has a supply of this still at home. 6. She'll follow up with me in 2 months. 15 minutes of face to face patient care time were spent during this visit. All questions were encouraged and answered.

## 2013-10-08 NOTE — Patient Instructions (Signed)
WORK ON RE-STARTING A HOME EXERCISE PROGRAM----AQUATIC EXERCISE WOULD BE GREAT

## 2013-11-30 ENCOUNTER — Other Ambulatory Visit: Payer: Self-pay

## 2013-12-10 ENCOUNTER — Other Ambulatory Visit: Payer: Self-pay | Admitting: Physical Medicine & Rehabilitation

## 2013-12-10 ENCOUNTER — Encounter: Payer: Medicare Other | Attending: Physical Medicine and Rehabilitation | Admitting: Physical Medicine & Rehabilitation

## 2013-12-10 ENCOUNTER — Encounter: Payer: Self-pay | Admitting: Physical Medicine & Rehabilitation

## 2013-12-10 VITALS — BP 141/67 | HR 73 | Resp 14 | Ht 63.0 in | Wt 168.0 lb

## 2013-12-10 DIAGNOSIS — S0452XS Injury of facial nerve, left side, sequela: Secondary | ICD-10-CM | POA: Insufficient documentation

## 2013-12-10 DIAGNOSIS — Z79899 Other long term (current) drug therapy: Secondary | ICD-10-CM | POA: Diagnosis not present

## 2013-12-10 DIAGNOSIS — D333 Benign neoplasm of cranial nerves: Secondary | ICD-10-CM

## 2013-12-10 DIAGNOSIS — Z5181 Encounter for therapeutic drug level monitoring: Secondary | ICD-10-CM | POA: Diagnosis not present

## 2013-12-10 DIAGNOSIS — G894 Chronic pain syndrome: Secondary | ICD-10-CM | POA: Diagnosis not present

## 2013-12-10 DIAGNOSIS — G500 Trigeminal neuralgia: Secondary | ICD-10-CM | POA: Diagnosis not present

## 2013-12-10 MED ORDER — TAPENTADOL HCL 50 MG PO TABS: 50.0000 mg | ORAL_TABLET | Freq: Two times a day (BID) | ORAL | Status: DC | PRN

## 2013-12-10 MED ORDER — LIDOCAINE 5 % EX OINT: 1.0000 "application " | TOPICAL_OINTMENT | CUTANEOUS | Status: DC | PRN

## 2013-12-10 MED ORDER — METHADONE HCL 10 MG PO TABS: 5.0000 mg | ORAL_TABLET | Freq: Two times a day (BID) | ORAL | Status: DC

## 2013-12-10 NOTE — Patient Instructions (Signed)
PLEASE CALL ME WITH ANY PROBLEMS OR QUESTIONS (#[phone]).      

## 2013-12-10 NOTE — Progress Notes (Signed)
Subjective:    Patient ID: [name], female    DOB: [birth year], 68 y.o.   MRN: [id number]  HPI  [name] is back regarding her vestibular schwannoma and associated deficits. For the most part she's been stable. She has developed some cataracts in the left eye which needs to be removed.   She remains compliant with her medications.    Pain Inventory Average Pain 6 Pain Right Now 6 My pain is constant, burning, stabbing and tingling  In the last 24 hours, has pain interfered with the following? General activity 7 Relation with others 9 Enjoyment of life 10 What TIME of day is your pain at its worst? daytime and evening Sleep (in general) Good  Pain is worse with: talking Pain improves with: rest, heat/ice and medication Relief from Meds: 8  Mobility walk without assistance  Function retired  Neuro/Psych weakness numbness trouble walking dizziness  Prior Studies Any changes since last visit?  no  Physicians involved in your care Any changes since last visit?  no   Family History  Problem Relation Age of Onset  . Hypertension Mother   . Transient ischemic attack Mother   . Coronary artery disease Father     CHF  . Transient ischemic attack Maternal Grandmother   . Diabetes Neg Hx   . Cancer Neg Hx    History   Social History  . Marital Status: Married    Spouse Name: N/A    Number of Children: N/A  . Years of Education: N/A   Social History Main Topics  . Smoking status: Never Smoker   . Smokeless tobacco: Never Used  . Alcohol Use: No  . Drug Use: None  . Sexual Activity: None   Other Topics Concern  . None   Social History Narrative  . None   Past Surgical History  Procedure Laterality Date  . Abdominal hysterectomy    . No colonoscopy      "just me"  . Lumbar disc surgery      Dr Eddie Dibbles   Past Medical History  Diagnosis Date  . Trigeminal neuralgia   . Abnormality of gait   . Facial nerve disorder   . Myofascial pain   .  Cervicalgia   . Brachial neuritis or radiculitis NOS    BP 141/67  Pulse 73  Resp 14  Ht 5\' 3"  (1.6 m)  Wt 168 lb (76.204 kg)  BMI 29.77 kg/m2  SpO2 97%  Opioid Risk Score:   Fall Risk Score: Moderate Fall Risk (6-13 points)  Review of Systems  Constitutional: Positive for unexpected weight change.  Respiratory: Positive for shortness of breath.   Cardiovascular: Positive for leg swelling.  Genitourinary: Positive for difficulty urinating.  Musculoskeletal: Positive for gait problem.  Neurological: Positive for dizziness, weakness and numbness.  All other systems reviewed and are negative.      Objective:   Physical Exam  HENT:  Head: Normocephalic and atraumatic.  Eyes: Conjunctivae and EOM are normal. Pupils are equal, round, and reactive to light.  Left sclera slightly irritated.  Cardiovascular: Normal rate and regular rhythm.  Pulmonary/Chest: Effort normal. No respiratory distress. She has no wheezes.  Abdominal: Bowel sounds are normal.  Musculoskeletal: Normal range of motion.  Neurological: She is alert and oriented to person, place, and time. Balance still off, with occasional ataxia noted.  Left lid doe not close completely and she has persistent left facial droop. Left face remains somewhat hypersenstiive to touch.  Psychiatric: She  has a normal mood and affect. Her behavior is normal. Judgment and thought content normal.   Assessment & Plan:   ASSESSMENT:  1. History of vestibular schwannoma resulting in trigeminal neuralgia and left facial nerve injury- no tumor regrowth per MRI--has chronic peri-operative scarring and atrophy which accounts for weakness, pain, and balance issues  2. History of cervicalgia  3. Chronic pain related to CN 5 involvement    PLAN:  1. I refilled methadone 10 mg 1.5-1 p.o. b.i.d., 75 for next month.  2. Nucynta 50 mg 1 p.o. q.12 h. p.r.n. 30 with no refill for next month.   4. Recommended swim goggles to help with exposure of  left eye when she sleeps.  5. We discontinued the compounded cream and will try 5% xylocaine gel prn. She also will continue with the voltaren which seems to really help.  6. She'll follow up with me in 2 months. 15 minutes of face to face patient care time were spent during this visit. All questions were encouraged and answered.

## 2013-12-17 ENCOUNTER — Other Ambulatory Visit: Payer: Self-pay | Admitting: Internal Medicine

## 2013-12-25 DIAGNOSIS — Z23 Encounter for immunization: Secondary | ICD-10-CM | POA: Diagnosis not present

## 2013-12-25 DIAGNOSIS — I10 Essential (primary) hypertension: Secondary | ICD-10-CM | POA: Diagnosis not present

## 2013-12-25 DIAGNOSIS — E559 Vitamin D deficiency, unspecified: Secondary | ICD-10-CM | POA: Diagnosis not present

## 2013-12-25 DIAGNOSIS — F419 Anxiety disorder, unspecified: Secondary | ICD-10-CM | POA: Diagnosis not present

## 2014-01-02 ENCOUNTER — Telehealth: Payer: Self-pay | Admitting: Internal Medicine

## 2014-01-02 DIAGNOSIS — I10 Essential (primary) hypertension: Secondary | ICD-10-CM

## 2014-01-02 MED ORDER — LOSARTAN POTASSIUM-HCTZ 100-12.5 MG PO TABS: 1.0000 | ORAL_TABLET | Freq: Every day | ORAL | Status: DC

## 2014-01-02 NOTE — Telephone Encounter (Signed)
I spoke to the patient and let her know I will send her blood pressure medicine to [facility]

## 2014-01-02 NOTE — Telephone Encounter (Signed)
Pt has scheduled yearly exam with Dr Linna Darner for 1/21, however pt will be out of medication before that. Can pt get a refill for December to get her till 03/07/14 appt?

## 2014-01-22 DIAGNOSIS — H2511 Age-related nuclear cataract, right eye: Secondary | ICD-10-CM | POA: Diagnosis not present

## 2014-01-22 DIAGNOSIS — H2512 Age-related nuclear cataract, left eye: Secondary | ICD-10-CM | POA: Diagnosis not present

## 2014-01-22 DIAGNOSIS — H18411 Arcus senilis, right eye: Secondary | ICD-10-CM | POA: Diagnosis not present

## 2014-01-22 DIAGNOSIS — H18412 Arcus senilis, left eye: Secondary | ICD-10-CM | POA: Diagnosis not present

## 2014-03-07 ENCOUNTER — Encounter: Payer: Self-pay | Admitting: Internal Medicine

## 2014-03-07 ENCOUNTER — Ambulatory Visit (INDEPENDENT_AMBULATORY_CARE_PROVIDER_SITE_OTHER): Payer: Medicare Other | Admitting: Internal Medicine

## 2014-03-07 VITALS — BP 170/90 | HR 69 | Temp 98.4°F | Resp 14 | Ht 63.0 in | Wt 167.2 lb

## 2014-03-07 DIAGNOSIS — R1314 Dysphagia, pharyngoesophageal phase: Secondary | ICD-10-CM | POA: Insufficient documentation

## 2014-03-07 DIAGNOSIS — Z0000 Encounter for general adult medical examination without abnormal findings: Secondary | ICD-10-CM

## 2014-03-07 DIAGNOSIS — I10 Essential (primary) hypertension: Secondary | ICD-10-CM | POA: Diagnosis not present

## 2014-03-07 DIAGNOSIS — E782 Mixed hyperlipidemia: Secondary | ICD-10-CM | POA: Diagnosis not present

## 2014-03-07 MED ORDER — LOSARTAN POTASSIUM-HCTZ 100-12.5 MG PO TABS: 1.0000 | ORAL_TABLET | Freq: Every day | ORAL | Status: DC

## 2014-03-07 NOTE — Assessment & Plan Note (Signed)
GI referral CBC

## 2014-03-07 NOTE — Progress Notes (Signed)
Pre visit review using our clinic review tool, if applicable. No additional management support is needed unless otherwise documented below in the visit note. 

## 2014-03-07 NOTE — Assessment & Plan Note (Signed)
Lipids, LFTs, TSH  

## 2014-03-07 NOTE — Patient Instructions (Signed)
Reflux of gastric acid may be asymptomatic as this may occur mainly during sleep.The triggers for reflux  include stress; the "aspirin family" ; alcohol; peppermint; and caffeine (coffee, tea, cola, and chocolate). The aspirin family would include aspirin and the nonsteroidal agents such as ibuprofen &  Naproxen. Tylenol would not cause reflux. If having symptoms ; food & drink should be avoided for @ least 2 hours before going to bed.    Minimal Blood Pressure Goal= AVERAGE < 140/90;  Ideal is an AVERAGE < 135/85. This AVERAGE should be calculated from @ least 5-7 BP readings taken @ different times of day on different days of week. You should not respond to isolated BP readings , but rather the AVERAGE for that week .Please bring your  blood pressure cuff to office visits to verify that it is reliable.It  can also be checked against the blood pressure device at the pharmacy. Finger or wrist cuffs are not dependable; an arm cuff is.

## 2014-03-07 NOTE — Assessment & Plan Note (Signed)
Blood pressure goals reviewed. BMET 

## 2014-03-07 NOTE — Progress Notes (Signed)
Subjective:    Patient ID: [name], female    DOB: 11-23-1945, 70 y.o.   MRN: [id number]  HPI  Medicare Wellness Visit: Psychosocial and medical history were reviewed as required by Medicare (history related to abuse, antisocial behavior , firearm risk). Social history: Caffeine: 1 cup/day Alcohol:  no Tobacco YFV:CBSWH Exercise: no Personal safety/fall risk:no Limitations of activities of daily living:no Seatbelt/ smoke alarm use:yes Healthcare Power of Attorney/Living Will status and End of Life process assessment : needed;process discussed Ophthalmologic exam status: UTD Hearing evaluation status:UTD (has hearing aid) Orientation: Oriented X 3 Memory and recall: good Spelling testing: "DLORW" Depression/anxiety assessment: no Foreign travel history: [place] 1996 Immunization status for influenza/pneumonia/ shingles /tetanus: Prevnar this year ; shingles needed Transfusion history:no Preventive health care maintenance status: Colonoscopy/BMD/mammogram/Pap as per protocol/standard care: no colonoscopy ;"scared to have one"; SOC reviewed Dental care:every 12 mos Chart reviewed and updated. Active issues reviewed and addressed as documented below.  Out of BP med 1 week Some headaches off BP med.Marland KitchenRestricts sodium; no CVE as noted. Not monitoring BP. She has some chronic LLE edema.  Dysphagia with every meal for 1 year.  Review of Systems    Chest pain, palpitations, tachycardia, exertional dyspnea, paroxysmal nocturnal dyspnea, or claudication  are absent.  Unexplained weight loss, abdominal pain, significant dyspepsia, melena, rectal bleeding, or persistently small caliber stools are denied.      Objective:   Physical Exam  Gen.: Healthy and well-nourished in appearance. Alert, appropriate and cooperative throughout exam. Appears younger than stated age. Post operative facial changes on left with mask like hemifacial pattern.  Head: Normocephalic without  obvious abnormalities  Eyes: No corneal or conjunctival inflammation noted. Pupils equal round reactive to light and accommodation. Extraocular motion intact but OS demonstrates medially deviation with superior gaze..  Vision grossly normal with /w/o lenses Ears: External  ear exam reveals no significant lesions or deformities. Hearing aid in R canal. Nose: External nasal exam reveals no deformity or inflammation. Nasal mucosa are pink and moist. No lesions or exudates noted.   Mouth: Oral mucosa and oropharynx reveal no lesions or exudates. Teeth in good repair. Neck: Indurated SQ changes to palpation.No deformities, masses, or tenderness noted. Range of motion decreased . Thyroid firm w/o nodularity or enlargement. Lungs: Normal respiratory effort; chest expands symmetrically. Lungs are clear to auscultation without rales, wheezes, or increased work of breathing. Heart: Normal rate and rhythm. Normal S1 and S2. No gallop, click, or rub. No murmur. Abdomen: Bowel sounds normal; abdomen soft and nontender. No masses, organomegaly or hernias noted. Genitalia: as per Gyn                                  Musculoskeletal/extremities:Slightly accentuated curvature of upper thoracic spine.Asymmetry of thoracic musculature; L > R No clubbing, cyanosis,  or significant extremity  deformity noted.  Range of motion normal . Tone & strength normal. Hand joints normal Fingernail health good. Trace edema @ sock line Able to lie down & sit up w/o help.  Negative SLR bilaterally Vascular: Carotid, radial artery, dorsalis pedis and  posterior tibial pulses are full and equal. No bruits present. Neurologic: Alert and oriented x3. Deep tendon reflexes symmetrical and normal.  Gait normal      Skin: Intact without suspicious lesions or rashes. Lymph: No cervical, axillary lymphadenopathy present. Psych: Mood and affect are normal. Normally interactive  Assessment & Plan:  See Current Assessment & Plan in Problem List under specific DiagnosisThe labs will be reviewed and risks and options assessed. Written recommendations will be provided by mail or directly through My Chart.Further evaluation or change in medical therapy will be directed by those results. #2 dysphagia , R/O stricture vs neurologic etiology

## 2014-03-08 NOTE — Progress Notes (Signed)
   Subjective:    Patient ID: [name], female    DOB: 02-23-1945, 69 y.o.   MRN: [id number]  HPI    Review of Systems     Objective:   Physical Exam        Assessment & Plan:

## 2014-03-11 ENCOUNTER — Ambulatory Visit: Payer: Medicare Other | Admitting: Physical Medicine & Rehabilitation

## 2014-03-20 ENCOUNTER — Other Ambulatory Visit (INDEPENDENT_AMBULATORY_CARE_PROVIDER_SITE_OTHER): Payer: Medicare Other

## 2014-03-20 DIAGNOSIS — R1314 Dysphagia, pharyngoesophageal phase: Secondary | ICD-10-CM

## 2014-03-20 DIAGNOSIS — E782 Mixed hyperlipidemia: Secondary | ICD-10-CM

## 2014-03-20 DIAGNOSIS — I10 Essential (primary) hypertension: Secondary | ICD-10-CM | POA: Diagnosis not present

## 2014-03-20 LAB — HEPATIC FUNCTION PANEL
ALBUMIN: 4.3 g/dL (ref 3.5–5.2)
ALK PHOS: 80 U/L (ref 39–117)
ALT: 10 U/L (ref 0–35)
AST: 15 U/L (ref 0–37)
Bilirubin, Direct: 0.1 mg/dL (ref 0.0–0.3)
TOTAL PROTEIN: 6.7 g/dL (ref 6.0–8.3)
Total Bilirubin: 0.7 mg/dL (ref 0.2–1.2)

## 2014-03-20 LAB — CBC WITH DIFFERENTIAL/PLATELET
BASOS ABS: 0 10*3/uL (ref 0.0–0.1)
Basophils Relative: 0.5 % (ref 0.0–3.0)
EOS PCT: 3.8 % (ref 0.0–5.0)
Eosinophils Absolute: 0.3 10*3/uL (ref 0.0–0.7)
HEMATOCRIT: 40.9 % (ref 36.0–46.0)
Hemoglobin: 13.8 g/dL (ref 12.0–15.0)
Lymphocytes Relative: 36.3 % (ref 12.0–46.0)
Lymphs Abs: 2.9 10*3/uL (ref 0.7–4.0)
MCHC: 33.8 g/dL (ref 30.0–36.0)
MCV: 85.1 fl (ref 78.0–100.0)
MONOS PCT: 8.7 % (ref 3.0–12.0)
Monocytes Absolute: 0.7 10*3/uL (ref 0.1–1.0)
NEUTROS PCT: 50.7 % (ref 43.0–77.0)
Neutro Abs: 4 10*3/uL (ref 1.4–7.7)
Platelets: 279 10*3/uL (ref 150.0–400.0)
RBC: 4.81 Mil/uL (ref 3.87–5.11)
RDW: 14.2 % (ref 11.5–15.5)
WBC: 7.9 10*3/uL (ref 4.0–10.5)

## 2014-03-20 LAB — BASIC METABOLIC PANEL
BUN: 14 mg/dL (ref 6–23)
CHLORIDE: 105 meq/L (ref 96–112)
CO2: 31 meq/L (ref 19–32)
CREATININE: 0.92 mg/dL (ref 0.40–1.20)
Calcium: 10 mg/dL (ref 8.4–10.5)
GFR: 64.1 mL/min (ref >60.00)
GLUCOSE: 108 mg/dL — AB (ref 70–99)
POTASSIUM: 4.6 meq/L (ref 3.5–5.1)
Sodium: 140 mEq/L (ref 135–145)

## 2014-03-20 LAB — TSH: TSH: 21.01 u[IU]/mL — AB (ref 0.35–4.50)

## 2014-03-20 LAB — LIPID PANEL
CHOL/HDL RATIO: 6
CHOLESTEROL: 272 mg/dL — AB (ref 0–200)
HDL: 47.8 mg/dL (ref >39.00)
LDL CALC: 184 mg/dL — AB (ref 0–99)
NONHDL: 224.2
TRIGLYCERIDES: 199 mg/dL — AB (ref 0.0–149.0)
VLDL: 39.8 mg/dL (ref 0.0–40.0)

## 2014-03-21 ENCOUNTER — Other Ambulatory Visit (INDEPENDENT_AMBULATORY_CARE_PROVIDER_SITE_OTHER): Payer: Medicare Other

## 2014-03-21 ENCOUNTER — Telehealth: Payer: Self-pay

## 2014-03-21 ENCOUNTER — Other Ambulatory Visit: Payer: Self-pay | Admitting: Internal Medicine

## 2014-03-21 ENCOUNTER — Encounter: Payer: Self-pay | Admitting: Internal Medicine

## 2014-03-21 DIAGNOSIS — R739 Hyperglycemia, unspecified: Secondary | ICD-10-CM | POA: Diagnosis not present

## 2014-03-21 DIAGNOSIS — R946 Abnormal results of thyroid function studies: Secondary | ICD-10-CM

## 2014-03-21 DIAGNOSIS — E039 Hypothyroidism, unspecified: Secondary | ICD-10-CM | POA: Insufficient documentation

## 2014-03-21 LAB — HEMOGLOBIN A1C: HEMOGLOBIN A1C: 5.9 % (ref 4.6–6.5)

## 2014-03-21 NOTE — Telephone Encounter (Signed)
Request for add on has been faxed to lab 

## 2014-03-21 NOTE — Telephone Encounter (Signed)
-----   Message from Hendricks Limes, MD sent at 03/21/2014  1:12 PM EST ----- Please add A1c (R73.9)

## 2014-03-25 ENCOUNTER — Encounter: Payer: Medicare Other | Attending: Physical Medicine & Rehabilitation | Admitting: Physical Medicine & Rehabilitation

## 2014-03-25 ENCOUNTER — Encounter: Payer: Self-pay | Admitting: Physical Medicine & Rehabilitation

## 2014-03-25 VITALS — BP 144/82 | HR 85 | Resp 14

## 2014-03-25 DIAGNOSIS — D333 Benign neoplasm of cranial nerves: Secondary | ICD-10-CM | POA: Diagnosis not present

## 2014-03-25 DIAGNOSIS — G500 Trigeminal neuralgia: Secondary | ICD-10-CM

## 2014-03-25 DIAGNOSIS — G8929 Other chronic pain: Secondary | ICD-10-CM | POA: Diagnosis not present

## 2014-03-25 MED ORDER — METHADONE HCL 10 MG PO TABS: 5.0000 mg | ORAL_TABLET | Freq: Two times a day (BID) | ORAL | Status: DC

## 2014-03-25 MED ORDER — TAPENTADOL HCL 50 MG PO TABS: 50.0000 mg | ORAL_TABLET | Freq: Two times a day (BID) | ORAL | Status: DC | PRN

## 2014-03-25 NOTE — Patient Instructions (Signed)
PLEASE CALL ME WITH ANY PROBLEMS OR QUESTIONS (#[phone]).      

## 2014-03-25 NOTE — Progress Notes (Signed)
Subjective:    Patient ID: [name], female    DOB: September 22, 1945, 69 y.o.   MRN: [id number]  HPI   [name] is back regarding her chronic pain and vestibular schwannoma. She typically has more pain in the colder weather. She often has to cover her face when she goes outside when it's cold and windy.   She had an episode where she couldn't focus her eyes and she had a hard time standing up. Her BP was elevated at the time. She hasn't had any spells since then.   She is scheduled for cataracts surgery later this year.    Pain Inventory Average Pain 8 Pain Right Now 8 My pain is intermittent, sharp, burning, stabbing and tingling  In the last 24 hours, has pain interfered with the following? General activity 9 Relation with others 10 Enjoyment of life 10 What TIME of day is your pain at its worst? daytime and evening Sleep (in general) Good  Pain is worse with: unsure Pain improves with: medication and talking Relief from Meds: 8  Mobility walk without assistance ability to climb steps?  yes do you drive?  yes  Function disabled: date disabled 2005 retired I need assistance with the following:  household duties and shopping  Neuro/Psych weakness numbness trouble walking dizziness anxiety loss of taste or smell  Prior Studies Any changes since last visit?  no  Physicians involved in your care Any changes since last visit?  no   Family History  Problem Relation Age of Onset  . Hypertension Mother   . Transient ischemic attack Mother   . Coronary artery disease Father     CHF  . Transient ischemic attack Maternal Grandmother   . Diabetes Neg Hx   . Cancer Neg Hx   . Coronary artery disease Sister    History   Social History  . Marital Status: Married    Spouse Name: N/A    Number of Children: N/A  . Years of Education: N/A   Social History Main Topics  . Smoking status: Never Smoker   . Smokeless tobacco: Never Used  . Alcohol Use: No  .  Drug Use: None  . Sexual Activity: None   Other Topics Concern  . None   Social History Narrative   Past Surgical History  Procedure Laterality Date  . Abdominal hysterectomy    . No colonoscopy      "scared"; SOC reviewed  . Lumbar disc surgery      Dr Eddie Dibbles   Past Medical History  Diagnosis Date  . Trigeminal neuralgia   . Abnormality of gait   . Facial nerve disorder   . Myofascial pain   . Cervicalgia   . Brachial neuritis or radiculitis NOS    BP 144/82 mmHg  Pulse 85  Resp 14  SpO2 97%  Opioid Risk Score:   Fall Risk Score: Moderate Fall Risk (6-13 points) (previously educated and given handout)  Review of Systems  Constitutional: Positive for unexpected weight change.       Loss of taste or smell  Cardiovascular: Positive for leg swelling.  Gastrointestinal: Positive for constipation.  Genitourinary: Positive for difficulty urinating.  Musculoskeletal: Positive for gait problem.  Neurological: Positive for dizziness, weakness and numbness.  Psychiatric/Behavioral: The patient is nervous/anxious.   All other systems reviewed and are negative.      Objective:   Physical Exam  HENT:  Head: Normocephalic and atraumatic.  Eyes: Conjunctivae and EOM are normal. Pupils are  equal, round, and reactive to light. Left sclera slightly irritated.  Cardiovascular: Normal rate and regular rhythm.  Pulmonary/Chest: Effort normal. No respiratory distress. She has no wheezes.  Abdominal: Bowel sounds are normal.  Musculoskeletal: Normal range of motion.  Neurological: She is alert and oriented to person, place, and time. Balance still off, with occasional ataxia noted.  Left lid doe not close completely and she has persistent left facial droop. Left face remains somewhat hypersenstiive to touch.  Psychiatric: She has a normal mood and affect. Her behavior is normal. Judgment and thought content normal.   Assessment & Plan:   ASSESSMENT:  1. History of vestibular  schwannoma resulting in trigeminal neuralgia and left facial nerve injury- no tumor regrowth per MRI--has chronic peri-operative scarring and atrophy which accounts for weakness, pain, and balance issues  2. History of cervicalgia  3. Chronic pain related to CN 5 involvement   PLAN:  1. I refilled methadone 10 mg 1.5-1 p.o. b.i.d., 75 for next month.  2. Nucynta 50 mg 1 p.o. q.12 h. p.r.n. 30 with no refill for next month.  3. Asked her to observe for any further symptoms similar to what she experienced 2 weeks ago. I tend to think these were anxiety related and related to her baseline vestibular dysfunction. If they recur, she will contact me.    5. She will continue with the voltaren which seems to really help. Refill wasn't needed today 6. She'll follow up with me in 2 months. 15 minutes of face to face patient care time were spent during this visit. All questions were encouraged and answered.

## 2014-03-28 ENCOUNTER — Encounter: Payer: Self-pay | Admitting: Internal Medicine

## 2014-04-07 NOTE — Telephone Encounter (Signed)
Na

## 2014-04-22 DIAGNOSIS — J069 Acute upper respiratory infection, unspecified: Secondary | ICD-10-CM | POA: Diagnosis not present

## 2014-04-22 DIAGNOSIS — J029 Acute pharyngitis, unspecified: Secondary | ICD-10-CM | POA: Diagnosis not present

## 2014-04-22 DIAGNOSIS — M62838 Other muscle spasm: Secondary | ICD-10-CM | POA: Diagnosis not present

## 2014-05-01 ENCOUNTER — Telehealth: Payer: Self-pay

## 2014-05-01 NOTE — Telephone Encounter (Signed)
Pt declines flu shot.  

## 2014-05-09 ENCOUNTER — Other Ambulatory Visit (INDEPENDENT_AMBULATORY_CARE_PROVIDER_SITE_OTHER): Payer: Medicare Other

## 2014-05-09 ENCOUNTER — Other Ambulatory Visit: Payer: Self-pay | Admitting: Internal Medicine

## 2014-05-09 DIAGNOSIS — R946 Abnormal results of thyroid function studies: Secondary | ICD-10-CM | POA: Diagnosis not present

## 2014-05-09 LAB — T3, FREE: T3, Free: 2.7 pg/mL (ref 2.3–4.2)

## 2014-05-09 LAB — TSH: TSH: 12.53 u[IU]/mL — AB (ref 0.35–4.50)

## 2014-05-09 LAB — T4, FREE: Free T4: 0.61 ng/dL (ref 0.60–1.60)

## 2014-05-14 ENCOUNTER — Encounter: Payer: Self-pay | Admitting: Internal Medicine

## 2014-05-14 ENCOUNTER — Ambulatory Visit (INDEPENDENT_AMBULATORY_CARE_PROVIDER_SITE_OTHER): Payer: Medicare Other | Admitting: Internal Medicine

## 2014-05-14 VITALS — BP 126/74 | HR 96 | Temp 97.9°F | Resp 15 | Ht 63.0 in | Wt 165.1 lb

## 2014-05-14 VITALS — BP 134/62 | HR 76 | Ht 63.0 in | Wt 165.1 lb

## 2014-05-14 DIAGNOSIS — E782 Mixed hyperlipidemia: Secondary | ICD-10-CM

## 2014-05-14 DIAGNOSIS — D333 Benign neoplasm of cranial nerves: Secondary | ICD-10-CM | POA: Diagnosis not present

## 2014-05-14 DIAGNOSIS — R1310 Dysphagia, unspecified: Secondary | ICD-10-CM

## 2014-05-14 DIAGNOSIS — R946 Abnormal results of thyroid function studies: Secondary | ICD-10-CM

## 2014-05-14 MED ORDER — LEVOTHYROXINE SODIUM 25 MCG PO TABS: 25.0000 ug | ORAL_TABLET | Freq: Every day | ORAL | Status: DC

## 2014-05-14 NOTE — Progress Notes (Signed)
   Subjective:    Patient ID: [name], female    DOB: 1945-12-01, 69 y.o.   MRN: [id number]  HPI Her TSH was 21.01 on 03/20/14 ;follow-up TSH revealed a value of 12.53 with a free T4 of 0.61 and free T3 of 2.7.  She believes she's gained weight; this was not documented by review of VS flowsheet. She felt that she was having weights fluctuating 15-30 pounds. She been on Weight Watchers with an 11 pound weight loss but she is regaining this with the maintenance program. She exercises 30 minutes 3 times a week on exercise & walking  1-2 hours per week  without reported symptoms.  Her father did have hypothyroidism. 20 years ago she believes she was assessed for goiter by ultrasound.  Her triglycerides were elevated 199 and LDL 184 on 2/3. She did have mild hyperglycemia fasting but the A1c was normal.  There is no family history of myocardial infarction. Her maternal grandmother had TIAs.  She has had one episode of lightheadedness 3/28 which resolved without treatment after 6 hours duration. There was no associated neurologic symptoms of numbness, tingling, limb weakness, or imbalance.  She questions thinning hair. She has constipation. She will be seeing Dr. Dennie Fetters, concerning dysphagia she is intolerant to both heat and cold.   Review of Systems Pertinent negative or absent signs and symptoms are as follows: Constitutional: No significant  fatigue; sleep disorder; change in appetite. Eye: no blurred, double ,loss of vision ENT/GI: no  diarrhea;hoarseness Derm: no change in nails or skin Neuro: no numbness or tingling; tremor Psych:no anxiety; depression; panic attacks.  Chest pain, palpitations, tachycardia, exertional dyspnea, paroxysmal nocturnal dyspnea, claudication or edema are absent.           Objective:   Physical Exam  Gen.:  Adequately nourished; in no acute distress Eyes: Extraocular motion intact; no lid lag , proptosis , or nystagmus.Post op facial  asymmetry Neck: full ROM; no masses ; thyroid normal  Heart: Normal rhythm and rate without significant murmur, gallop, or extra heart sounds Lungs: Chest clear to auscultation without rales,rales, wheezes NLG:XQJJH palpable;bruit present ; no AAA.No organomegaly or masses.No tenderness Neuro:Deep tendon reflexes are equal but 1.5 @ knees; no tremor  Skin/Nails: Warm and dry without significant lesions or rashes; no onycholysis Lymphatic: no cervical or axillary LA Psych: Normally communicative and interactive; no abnormal mood or affect clinically.         Assessment & Plan:  #1 abnormal TFT   #2 mixed dyslipidemia  See orders. After 10-11 weeks of thyroid replacement and nutritional intervention ; fasting lipids and TSH will be repeated to assess long-term risk & need for medication change.

## 2014-05-14 NOTE — Patient Instructions (Signed)
Please follow a Mediaterranean type diet  (many good cook books readily available) or review Dr Nunzio Cory book Eat, Snowflake for best  dietary cholesterol information & options.  Continue excellent cardiovascular exercise program 30-45 minutes 3-4 times per week. The most common cause of elevated triglycerides (TG) is the ingestion of sugar from high fructose corn syrup sources added to processed foods & drinks.  Eat a low-fat diet with lots of fruits and vegetables, up to 7-9 servings per day. Consume less than 30 Grams (preferably ZERO) of sugar per day from foods & drinks with High Fructose Corn Syrup (HFCS) sugar as #1,2,3 or # 4 on label.[employer], [employer] [place] do not carry products with HFCS. Fasting labs in 10-11 weeks.

## 2014-05-14 NOTE — Progress Notes (Signed)
[name] [birth year] [id number]  Note: This dictation was prepared with Dragon digital system. Any transcriptional errors that result from this procedure are unintentional.   History of Present Illness: This is a 69 year old white female with a several month history of  progressive dysphagia mostly to liquids. She underwent  Vestibular  schwannoma resection resulting in trigeminal neuralgia and left facial palsy in 2005. She has always had problems drinking and swallowing solid food as well as pills but in last several months her symptoms became worse. She is on chronic pain medications through pain clinic Dr [name]) taking methadone 10 mg twice a day and Nucyntha 50 mg 3 times a week. She has occasional  hoarseness and  cough but not at night. She denies heartburn. She strangles on  liquids almost on daily basis. There has been no weight loss.     Past Medical History  Diagnosis Date  . Trigeminal neuralgia   . Abnormality of gait   . Facial nerve disorder   . Myofascial pain   . Cervicalgia   . Brachial neuritis or radiculitis NOS   . Hyperlipidemia   . Hypertension   . Pneumonia 2005  . Hearing loss     Left ear    Past Surgical History  Procedure Laterality Date  . Abdominal hysterectomy    . No colonoscopy      "scared"; SOC reviewed  . Lumbar disc surgery      Dr [name]  . 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 Morphine     REACTION: GI upset    Family history and social history have been reviewed.  Review of Systems: Facial pain. Chronic difficulty swallowing liquid solids. Weakness of the left facial muscles  The remainder of the 10 point ROS is negative except as outlined in the H&P  Physical Exam: General Appearance Well developed, in no distress, left facial paresis Eyes  Non icteric  HEENT  Non traumatic, normocephalic  Mouth No lesion, tongue papillated, no cheilosis tong  deviates to the left Neck Supple without adenopathy, thyroid not enlarged, no carotid bruits, no JVD no tenderness in the neck Lungs Clear to auscultation bilaterally COR Normal S1, normal S2, regular rhythm, no murmur, quiet precordium Abdomen soft nontender with normoactive bowel sounds. Liver edge at costal margin Rectal not done Extremities  No pedal edema Skin No lesions Neurological Alert and oriented x 3 Psychological Normal mood and affect  Assessment and Plan:   69 year old white female with chronic left facial palsy dependent on  narcotics for trigeminal neuralgia. Her dysphagia to liquids  suggests motility disorder. We will obtain barium esophagram with tablet to assess the motility or possible stricture in upper esophagus. We have discussed possibility that the pain medication may slow down the peristalsis. She will have to take aspiration precautions. If there is a problem with barium esophagram suggesting aspiration she may need a modified barium with speech pathologist. If there is a stricture she will need endoscopy with possible dilation  CC DR [name], Dr [name].[name] 05/14/2014

## 2014-05-14 NOTE — Patient Instructions (Addendum)
You have been scheduled for a Barium Esophogram at [facility] (1st floor of the hospital) on April 7th at 11:00AM. Please arrive 15 minutes prior to your appointment for registration. Make certain not to have anything to eat or drink 6 hours prior to your test. If you need to reschedule for any reason, please contact radiology at [phone] to do so. __________________________________________________________________ A barium swallow is an examination that concentrates on views of the esophagus. This tends to be a double contrast exam (barium and two liquids which, when combined, create a gas to distend the wall of the oesophagus) or single contrast (non-ionic iodine based). The study is usually tailored to your symptoms so a good history is essential. Attention is paid during the study to the form, structure and configuration of the esophagus, looking for functional disorders (such as aspiration, dysphagia, achalasia, motility and reflux) EXAMINATION You may be asked to change into a gown, depending on the type of swallow being performed. A radiologist and radiographer will perform the procedure. The radiologist will advise you of the type of contrast selected for your procedure and direct you during the exam. You will be asked to stand, sit or lie in several different positions and to hold a small amount of fluid in your mouth before being asked to swallow while the imaging is performed .In some instances you may be asked to swallow barium coated marshmallows to assess the motility of a solid food bolus. The exam can be recorded as a digital or video fluoroscopy procedure. POST PROCEDURE It will take 1-2 days for the barium to pass through your system. To facilitate this, it is important, unless otherwise directed, to increase your fluids for the next 24-48hrs and to resume your normal diet.  This test typically takes about 30 minutes to  perform. __________________________________________________________________________________  I appreciate the opportunity to care for you. Dr Linna Darner, Dr Earnest Conroy.Tessa Lerner

## 2014-05-14 NOTE — Progress Notes (Signed)
Pre visit review using our clinic review tool, if applicable. No additional management support is needed unless otherwise documented below in the visit note. 

## 2014-05-22 ENCOUNTER — Encounter: Payer: Self-pay | Admitting: Physical Medicine & Rehabilitation

## 2014-05-22 ENCOUNTER — Encounter: Payer: Medicare Other | Attending: Physical Medicine & Rehabilitation | Admitting: Physical Medicine & Rehabilitation

## 2014-05-22 VITALS — BP 142/62 | HR 78 | Resp 14

## 2014-05-22 DIAGNOSIS — G894 Chronic pain syndrome: Secondary | ICD-10-CM | POA: Diagnosis not present

## 2014-05-22 DIAGNOSIS — R946 Abnormal results of thyroid function studies: Secondary | ICD-10-CM

## 2014-05-22 DIAGNOSIS — Z79899 Other long term (current) drug therapy: Secondary | ICD-10-CM

## 2014-05-22 DIAGNOSIS — S0451XS Injury of facial nerve, right side, sequela: Secondary | ICD-10-CM

## 2014-05-22 DIAGNOSIS — Z5181 Encounter for therapeutic drug level monitoring: Secondary | ICD-10-CM | POA: Diagnosis not present

## 2014-05-22 DIAGNOSIS — G500 Trigeminal neuralgia: Secondary | ICD-10-CM

## 2014-05-22 DIAGNOSIS — G8929 Other chronic pain: Secondary | ICD-10-CM | POA: Insufficient documentation

## 2014-05-22 DIAGNOSIS — D333 Benign neoplasm of cranial nerves: Secondary | ICD-10-CM

## 2014-05-22 MED ORDER — TAPENTADOL HCL 50 MG PO TABS: 50.0000 mg | ORAL_TABLET | Freq: Two times a day (BID) | ORAL | Status: DC | PRN

## 2014-05-22 MED ORDER — METHADONE HCL 10 MG PO TABS: 5.0000 mg | ORAL_TABLET | Freq: Two times a day (BID) | ORAL | Status: DC

## 2014-05-22 NOTE — Progress Notes (Signed)
Subjective:    Patient ID: [name], female    DOB: 1946-02-03, 69 y.o.   MRN: [id number]  HPI   Cortasia is back regarding her chronic pain and vestibular schwannoma. Her pain has been fairly stable. She was diagnosed with hypothyroid and is now on supplement therapy, levothyroxine.   She is sleeping fairly well.   She remains on the methadone and nucynta for pain control which are effective. She remains consistent with her counts and instructions.      Pain Inventory Average Pain 6 Pain Right Now 4 My pain is burning, stabbing and tingling  In the last 24 hours, has pain interfered with the following? General activity 6 Relation with others 8 Enjoyment of life 9 What TIME of day is your pain at its worst? daytime, evening Sleep (in general) Good  Pain is worse with: talking Pain improves with: medication Relief from Meds: 7  Mobility walk without assistance ability to climb steps?  yes do you drive?  yes transfers alone  Function retired I need assistance with the following:  household duties and shopping Do you have any goals in this area?  no  Neuro/Psych weakness dizziness loss of taste or smell  Prior Studies Any changes since last visit?  no CT/MRI  Physicians involved in your care Any changes since last visit?  no   Family History  Problem Relation Age of Onset  . Hypertension Mother   . Transient ischemic attack Mother   . Coronary artery disease Father     CHF  . Transient ischemic attack Maternal Grandmother   . Diabetes Neg Hx   . Cancer Neg Hx   . Coronary artery disease Sister    History   Social History  . Marital Status: Married    Spouse Name: N/A  . Number of Children: 1  . Years of Education: N/A   Occupational History  . Retired    Social History Main Topics  . Smoking status: Never Smoker   . Smokeless tobacco: Never Used  . Alcohol Use: No  . Drug Use: No  . Sexual Activity: Not on file   Other Topics  Concern  . None   Social History Narrative   Past Surgical History  Procedure Laterality Date  . Abdominal hysterectomy    . No colonoscopy      "scared"; SOC reviewed  . Lumbar disc surgery      Dr Eddie Dibbles  . 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 Trigeminal neuralgia   . Abnormality of gait   . Facial nerve disorder   . Myofascial pain   . Cervicalgia   . Brachial neuritis or radiculitis NOS   . Hyperlipidemia   . Hypertension   . Pneumonia 2005  . Hearing loss     Left ear  . Thyroid disease     hyprothyroidism   BP 142/62 mmHg  Pulse 78  Resp 14  SpO2 96%  Opioid Risk Score:   Fall Risk Score: Moderate Fall Risk (6-13 points)`1  Depression screen PHQ 2/9  Depression screen [facility] 2/9 05/22/2014 03/07/2014 12/15/2012  Decreased Interest 1 0 0  Down, Depressed, Hopeless 0 0 0  PHQ - 2 Score 1 0 0  Altered sleeping 1 - -  Tired, decreased energy 2 - -  Change in appetite 0 - -  Feeling bad or failure about  yourself  0 - -  Trouble concentrating 1 - -  Moving slowly or fidgety/restless 0 - -  Suicidal thoughts 0 - -  PHQ-9 Score 5 - -     Review of Systems  Constitutional:       Loss of taste and smell Weight gain  Gastrointestinal: Positive for constipation.  Endocrine:       Hypothyroidism- NEW  Genitourinary:       Urinary retention  Musculoskeletal:       Foot swelling  Neurological: Positive for dizziness and weakness.  All other systems reviewed and are negative.      Objective:   Physical Exam  HENT:  Head: Normocephalic and atraumatic.  Eyes: Conjunctivae and EOM are normal. Pupils are equal, round, and reactive to light. Left sclera slightly irritated.  Cardiovascular: Normal rate and regular rhythm.  Pulmonary/Chest: Effort normal. No respiratory distress. She has no wheezes.  Abdominal: Bowel sounds are normal.  Musculoskeletal: Normal range of motion.    Neurological: She is alert and oriented to person, place, and time. Balance still off, with occasional ataxia noted.  Left lid doe not close completely and she has persistent left facial droop. Left face remains somewhat hypersenstiive to touch.  Psychiatric: She has a normal mood and affect. Her behavior is normal. Judgment and thought content normal.    Assessment & Plan:   ASSESSMENT:  1. History of vestibular schwannoma resulting in trigeminal neuralgia and left facial nerve injury- no tumor regrowth per MRI--has chronic peri-operative scarring and atrophy which accounts for weakness, pain, and balance issues  2. History of cervicalgia  3. Chronic pain related to CN 5 involvement 4. Hypothyroid on supplementation    PLAN:  1. I refilled methadone 10 mg 1.5-1 p.o. b.i.d., 75 for next month.  2. Nucynta 50 mg 1 p.o. q.12 h. p.r.n. 30 with no refill for next month.  3. Thyroid supplement per primary. Hopefully this will help her energy and concentration levels. 5. She will continue with the voltaren which seems to really help.   6. She'll follow up with me in 2 months. 15 minutes of face to face patient care time were spent during this visit. All questions were encouraged and answered.

## 2014-05-22 NOTE — Patient Instructions (Signed)
PLEASE CALL ME WITH ANY PROBLEMS OR QUESTIONS (#[phone]).      

## 2014-05-23 ENCOUNTER — Ambulatory Visit (HOSPITAL_COMMUNITY): Payer: Medicare Other

## 2014-06-05 NOTE — Progress Notes (Signed)
Urine drug screen for this encounter is consistent for prescribed medication 

## 2014-07-03 DIAGNOSIS — M25511 Pain in right shoulder: Secondary | ICD-10-CM | POA: Diagnosis not present

## 2014-07-03 DIAGNOSIS — M7530 Calcific tendinitis of unspecified shoulder: Secondary | ICD-10-CM | POA: Diagnosis not present

## 2014-07-22 ENCOUNTER — Encounter: Payer: Self-pay | Admitting: Internal Medicine

## 2014-07-29 ENCOUNTER — Encounter: Payer: Self-pay | Admitting: Physical Medicine & Rehabilitation

## 2014-07-29 ENCOUNTER — Encounter: Payer: Medicare Other | Attending: Physical Medicine & Rehabilitation | Admitting: Physical Medicine & Rehabilitation

## 2014-07-29 VITALS — BP 130/54 | HR 76 | Resp 14

## 2014-07-29 DIAGNOSIS — G500 Trigeminal neuralgia: Secondary | ICD-10-CM | POA: Diagnosis not present

## 2014-07-29 DIAGNOSIS — S0452XS Injury of facial nerve, left side, sequela: Secondary | ICD-10-CM

## 2014-07-29 DIAGNOSIS — D333 Benign neoplasm of cranial nerves: Secondary | ICD-10-CM | POA: Insufficient documentation

## 2014-07-29 DIAGNOSIS — G8929 Other chronic pain: Secondary | ICD-10-CM | POA: Insufficient documentation

## 2014-07-29 DIAGNOSIS — M25511 Pain in right shoulder: Secondary | ICD-10-CM | POA: Diagnosis not present

## 2014-07-29 MED ORDER — METHADONE HCL 10 MG PO TABS: 5.0000 mg | ORAL_TABLET | Freq: Two times a day (BID) | ORAL | Status: DC

## 2014-07-29 MED ORDER — TAPENTADOL HCL 50 MG PO TABS: 50.0000 mg | ORAL_TABLET | Freq: Two times a day (BID) | ORAL | Status: DC | PRN

## 2014-07-29 NOTE — Progress Notes (Signed)
Subjective:    Patient ID: [name], female    DOB: March 12, 1945, 69 y.o.   MRN: [id number]  HPI   [name] is back regarding her chronic pain. Her right shoulder acted up a few weeks ago. She brought an xray with her which shows some calcifcation under the right deltoid. The pain resolved after a few days. She doesn't remember a focal incident or movement which triggered the pain.  As usual, she remains very self-conscious about her face. She asks today if therapy might help the muscle tone on the left side of her face.   Pain Inventory Average Pain 7 Pain Right Now 5 My pain is constant, burning, stabbing and tingling  In the last 24 hours, has pain interfered with the following? General activity 7 Relation with others 9 Enjoyment of life 9 What TIME of day is your pain at its worst? daytime and evening Sleep (in general) Good  Pain is worse with: TALKING Pain improves with: rest, heat/ice and medication Relief from Meds: 7  Mobility walk without assistance how many minutes can you walk? 5-10 ability to climb steps?  yes do you drive?  yes  Function employed # of hrs/week 8 disabled: date disabled 2005  Neuro/Psych weakness numbness tingling anxiety loss of taste or smell  Prior Studies Any changes since last visit?  no  Physicians involved in your care Any changes since last visit?  no   Family History  Problem Relation Age of Onset  . Hypertension Mother   . Transient ischemic attack Mother   . Coronary artery disease Father     CHF  . Transient ischemic attack Maternal Grandmother   . Diabetes Neg Hx   . Cancer Neg Hx   . Coronary artery disease Sister    History   Social History  . Marital Status: Married    Spouse Name: N/A  . Number of Children: 1  . Years of Education: N/A   Occupational History  . Retired    Social History Main Topics  . Smoking status: Never Smoker   . Smokeless tobacco: Never Used  . Alcohol Use: No  .  Drug Use: No  . Sexual Activity: Not on file   Other Topics Concern  . None   Social History Narrative   Past Surgical History  Procedure Laterality Date  . Abdominal hysterectomy    . No colonoscopy      "scared"; SOC reviewed  . Lumbar disc surgery      Dr Eddie Dibbles  . 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 Trigeminal neuralgia   . Abnormality of gait   . Facial nerve disorder   . Myofascial pain   . Cervicalgia   . Brachial neuritis or radiculitis NOS   . Hyperlipidemia   . Hypertension   . Pneumonia 2005  . Hearing loss     Left ear  . Thyroid disease     hyprothyroidism   BP 130/54 mmHg  Pulse 76  Resp 14  SpO2 99%  Opioid Risk Score:   Fall Risk Score: Moderate Fall Risk (6-13 points)`1  Depression screen PHQ 2/9  Depression screen [facility] 2/9 05/22/2014 03/07/2014 12/15/2012  Decreased Interest 1 0 0  Down, Depressed, Hopeless 0 0 0  PHQ - 2 Score 1 0 0  Altered sleeping 1 - -  Tired, decreased energy 2 - -  Change in appetite 0 - -  Feeling bad or failure about yourself  0 - -  Trouble concentrating 1 - -  Moving slowly or fidgety/restless 0 - -  Suicidal thoughts 0 - -  PHQ-9 Score 5 - -     Review of Systems  Constitutional: Positive for unexpected weight change.       Weight gain  HENT: Negative.   Eyes: Negative.   Respiratory: Negative.   Cardiovascular: Positive for leg swelling.  Gastrointestinal: Positive for constipation.  Endocrine: Negative.   Genitourinary: Positive for dysuria.  Musculoskeletal: Positive for myalgias, back pain and arthralgias.  Allergic/Immunologic: Negative.   Neurological: Positive for dizziness, weakness and numbness.       Tingling  Hematological: Negative.   Psychiatric/Behavioral: The patient is nervous/anxious.        Objective:   Physical Exam  HENT:  Head: Normocephalic and atraumatic.  Eyes: Conjunctivae and EOM are  normal. Pupils are equal, round, and reactive to light. Left sclera slightly irritated.  Cardiovascular: Normal rate and regular rhythm.  Pulmonary/Chest: Effort normal. No respiratory distress. She has no wheezes.  Abdominal: Bowel sounds are normal.  Musculoskeletal: Normal range of motion. No pain with right shoulder IR/ER Neurological: She is alert and oriented to person, place, and time. Balance still off, with occasional ataxia noted.  Left lid doe not close completely and she has persistent left facial droop. Left face remains somewhat hypersenstiive to touch. I see no neurological changes today.  Psychiatric: She has a normal mood and affect. Her behavior is normal. Judgment and thought content normal.    Assessment & Plan:   ASSESSMENT:  1. History of vestibular schwannoma resulting in trigeminal neuralgia and left facial nerve injury- no tumor regrowth per MRI--has chronic peri-operative scarring and atrophy which accounts for weakness, pain, and balance issues  2. History of cervicalgia  3. Chronic pain related to CN 5 involvement  4. Hypothyroid on supplementation 5. Right shoulder pain==resolved. Likely mild OA/RTC pathology    PLAN:  1.  Methadone 10 mg 1.5-1 p.o. b.i.d., #75 was refilled with a second rx for next month.  2. Nucynta 50 mg 1 p.o. q.12 h. p.r.n. 30 with no refill for next month.  3. Thyroid supplement per primary. Ongoing surveillance of levels is recommended  4. Reviewed xrays today which showed some calcification near deltoid---otherwise unremarkable.  5. She will continue with the voltaren which seems to really help.  6. She'll follow up with me in 2 months. 15 minutes of face to face patient care time were spent during this visit. All questions were encouraged and answered. We reviewed the fact that any perceived facial changes are likely soft tissue,age related in nature.  Neurologically she's stable

## 2014-07-29 NOTE — Patient Instructions (Signed)
PLEASE CALL ME WITH ANY PROBLEMS OR QUESTIONS (#[phone]).  HAVE A GOOD DAY!    

## 2014-07-31 ENCOUNTER — Ambulatory Visit (INDEPENDENT_AMBULATORY_CARE_PROVIDER_SITE_OTHER): Payer: Medicare Other | Admitting: Ophthalmology

## 2014-07-31 DIAGNOSIS — H35033 Hypertensive retinopathy, bilateral: Secondary | ICD-10-CM

## 2014-07-31 DIAGNOSIS — H43813 Vitreous degeneration, bilateral: Secondary | ICD-10-CM | POA: Diagnosis not present

## 2014-07-31 DIAGNOSIS — H3531 Nonexudative age-related macular degeneration: Secondary | ICD-10-CM

## 2014-07-31 DIAGNOSIS — I10 Essential (primary) hypertension: Secondary | ICD-10-CM

## 2014-08-01 ENCOUNTER — Other Ambulatory Visit (INDEPENDENT_AMBULATORY_CARE_PROVIDER_SITE_OTHER): Payer: Medicare Other

## 2014-08-01 DIAGNOSIS — R946 Abnormal results of thyroid function studies: Secondary | ICD-10-CM | POA: Diagnosis not present

## 2014-08-01 DIAGNOSIS — E782 Mixed hyperlipidemia: Secondary | ICD-10-CM

## 2014-08-01 LAB — LIPID PANEL
Cholesterol: 242 mg/dL — ABNORMAL HIGH (ref 0–200)
HDL: 54 mg/dL (ref >39.00)
LDL CALC: 155 mg/dL — AB (ref 0–99)
NonHDL: 188
Total CHOL/HDL Ratio: 4
Triglycerides: 164 mg/dL — ABNORMAL HIGH (ref 0.0–149.0)
VLDL: 32.8 mg/dL (ref 0.0–40.0)

## 2014-08-01 LAB — TSH: TSH: 4.94 u[IU]/mL — ABNORMAL HIGH (ref 0.35–4.50)

## 2014-08-06 ENCOUNTER — Encounter: Payer: Self-pay | Admitting: Internal Medicine

## 2014-08-06 ENCOUNTER — Ambulatory Visit (INDEPENDENT_AMBULATORY_CARE_PROVIDER_SITE_OTHER): Payer: Medicare Other | Admitting: Internal Medicine

## 2014-08-06 ENCOUNTER — Telehealth: Payer: Self-pay | Admitting: Internal Medicine

## 2014-08-06 VITALS — BP 146/82 | HR 71 | Temp 98.2°F | Resp 16 | Wt 162.0 lb

## 2014-08-06 DIAGNOSIS — M79661 Pain in right lower leg: Secondary | ICD-10-CM

## 2014-08-06 DIAGNOSIS — H9191 Unspecified hearing loss, right ear: Secondary | ICD-10-CM

## 2014-08-06 DIAGNOSIS — E782 Mixed hyperlipidemia: Secondary | ICD-10-CM | POA: Diagnosis not present

## 2014-08-06 DIAGNOSIS — M79662 Pain in left lower leg: Secondary | ICD-10-CM

## 2014-08-06 DIAGNOSIS — H9190 Unspecified hearing loss, unspecified ear: Secondary | ICD-10-CM | POA: Insufficient documentation

## 2014-08-06 DIAGNOSIS — E039 Hypothyroidism, unspecified: Secondary | ICD-10-CM

## 2014-08-06 MED ORDER — LEVOTHYROXINE SODIUM 25 MCG PO TABS: ORAL_TABLET | ORAL | Status: DC

## 2014-08-06 NOTE — Telephone Encounter (Signed)
States referral was sent over.  Needs orders changed from AVI to a LE arterial Venus.

## 2014-08-06 NOTE — Patient Instructions (Signed)
Risk of premature heart attack or stroke increases as LDL or BAD cholesterol rises, especially if there is family history of heart attack in males before 51 or women before 17. Based on your advanced testing, your LDL goal is < 100 , ideally < 70 . Your present LDL increases long term heart attack or stroke risk 55 %.  Please follow a Mediaterranean type diet  (many good cook books readily available) or review Dr Nunzio Cory book Eat, [place] for best  dietary cholesterol information & options.  Cardiovascular exercise, this can be as simple a program as walking, is recommended 30-45 minutes 3-4 times per week. If you're not exercising you should take 6-8 weeks to build up to this level.  If you cannot make significant changes in exercise and nutrition; statin medication at low dose would be the best therapeutic option to reduce your long term risk.

## 2014-08-06 NOTE — Progress Notes (Signed)
   Subjective:    Patient ID: [name], female    DOB: 1945/05/23, 69 y.o.   MRN: [id number]  HPI  She is here to follow-up on her hypothyroidism and dyslipidemia. The hypothyroidism has been almost totally corrected with small dose thyroid, specifically 25 g daily. In February her TSH was 21.01. It is now 4.94.She describes "energy is coming back".Less afternoon somnulence.  Her LDL has dropped from 184 down to 155. HDL has improved to 54. Triglycerides are still mildly elevated at 164.  She states she is compliant with her medications without adverse effects. She does restrict red meat, fried foods, and salt. She does not monitor blood pressure regularly but feels it is in the 140s over 50-70. She's been walking 1 mile every 2-3 days. She has exertional dyspnea as well as some calf discomfort which requires that she stop.  The family history includes TIAs in her maternal grandmother in her 48s of heart failure in her father. There is no family history of heart attack    Review of Systems Chest pain, palpitations, tachycardia, or paroxysmal nocturnal dyspnea are absent. "Fluid retention all over", worse through the day described. Dry skin; hot flashes; heat intolerance; & constipation present.  Additionally she has a hearing aid on the right but she feels that her hearing loss is progressing. She's requesting a follow-up evaluation.      Objective:   Physical Exam  Pertinent or positive findings include: Facial asymmetry is present. She's wearing hearing aid on the right. She has profound hearing deficit when aid is removed. She has a grade 1 systolic murmur. Crepitus of the knees is present. Pedal pulses are decreased. She has no definite ischemic changes of the feet.  General appearance :adequately nourished; in no distress.  Eyes: No conjunctival inflammation or scleral icterus is present.  Oral exam:  Lips and gums are healthy appearing.There is no oropharyngeal  erythema or exudate noted. Dental hygiene is good.  Heart:  Normal rate and regular rhythm. S1 and S2 normal without gallop,click or rub.  Lungs:Chest clear to auscultation; no wheezes, rhonchi,rales ,or rubs present.No increased work of breathing.   Abdomen: bowel sounds normal, soft and non-tender without masses, organomegaly or hernias noted.  No guarding or rebound.   Vascular : all pulses equal ; no bruits present.  Skin:Warm & dry.  Intact without suspicious lesions or rashes ; no tenting or jaundice   Lymphatic: No lymphadenopathy is noted about the head, neck, axilla.   Neuro: Strength, tone & DTRs normal.        Assessment & Plan:  See Current Assessment & Plan in Problem List under specific Diagnosis

## 2014-08-06 NOTE — Progress Notes (Signed)
Pre visit review using our clinic review tool, if applicable. No additional management support is needed unless otherwise documented below in the visit note. 

## 2014-08-06 NOTE — Telephone Encounter (Signed)
I am trying to rule out claudication; is ABI  ([id number] ) correct?

## 2014-08-06 NOTE — Assessment & Plan Note (Signed)
   Therapeutic lifestyle changes recommended. Associated risk of hormone replacement with elevated LDL also discussed.

## 2014-08-06 NOTE — Assessment & Plan Note (Signed)
Increase thyroid to 25 g daily except 1& one half Tuesday and Thursday

## 2014-08-06 NOTE — Assessment & Plan Note (Signed)
Audiology referral.

## 2014-08-06 NOTE — Assessment & Plan Note (Signed)
ABI

## 2014-08-06 NOTE — Telephone Encounter (Signed)
Please advise 

## 2014-08-07 ENCOUNTER — Other Ambulatory Visit: Payer: Self-pay | Admitting: Internal Medicine

## 2014-08-07 ENCOUNTER — Telehealth: Payer: Self-pay | Admitting: *Deleted

## 2014-08-07 DIAGNOSIS — Z1239 Encounter for other screening for malignant neoplasm of breast: Secondary | ICD-10-CM

## 2014-08-07 NOTE — Telephone Encounter (Signed)
Called pt concerning mammogram history. Per chart last mammo was back in 2012. Pt stated she hasn't had one since then. Will like to have mammogram done after she get testing done of her legs out the way...[name]

## 2014-08-08 NOTE — Telephone Encounter (Signed)
Ordered 6/22; is it correct ( under Imaging)

## 2014-08-08 NOTE — Telephone Encounter (Signed)
Noted../lmb 

## 2014-08-08 NOTE — Telephone Encounter (Signed)
Mammogram need to be order...[name]

## 2014-08-12 ENCOUNTER — Other Ambulatory Visit: Payer: Self-pay

## 2014-08-15 ENCOUNTER — Other Ambulatory Visit: Payer: Self-pay | Admitting: Internal Medicine

## 2014-08-15 NOTE — Telephone Encounter (Signed)
Patient need refill of Levothyroxine 25 90 pills, sed to [facility]

## 2014-08-15 NOTE — Telephone Encounter (Signed)
I need the Epic Code ; lower extrem arteretc did not come up

## 2014-08-15 NOTE — Telephone Encounter (Signed)
[name] from [facility], stated that  Not a ABI it need to come over as a LE arterial Venus. Please advise any questions

## 2014-08-15 NOTE — Telephone Encounter (Signed)
Please advise on [facility] comment

## 2014-08-20 ENCOUNTER — Other Ambulatory Visit: Payer: Self-pay | Admitting: Internal Medicine

## 2014-08-20 DIAGNOSIS — M79661 Pain in right lower leg: Secondary | ICD-10-CM

## 2014-08-20 DIAGNOSIS — M79662 Pain in left lower leg: Secondary | ICD-10-CM

## 2014-08-20 DIAGNOSIS — E782 Mixed hyperlipidemia: Secondary | ICD-10-CM

## 2014-08-20 DIAGNOSIS — I739 Peripheral vascular disease, unspecified: Secondary | ICD-10-CM

## 2014-08-22 ENCOUNTER — Ambulatory Visit (HOSPITAL_COMMUNITY): Payer: Medicare Other | Attending: Cardiovascular Disease

## 2014-08-22 DIAGNOSIS — M79661 Pain in right lower leg: Secondary | ICD-10-CM

## 2014-08-22 DIAGNOSIS — I739 Peripheral vascular disease, unspecified: Secondary | ICD-10-CM | POA: Diagnosis not present

## 2014-08-22 DIAGNOSIS — E782 Mixed hyperlipidemia: Secondary | ICD-10-CM

## 2014-08-22 DIAGNOSIS — M79662 Pain in left lower leg: Secondary | ICD-10-CM | POA: Diagnosis not present

## 2014-09-03 DIAGNOSIS — H905 Unspecified sensorineural hearing loss: Secondary | ICD-10-CM | POA: Diagnosis not present

## 2014-10-07 ENCOUNTER — Encounter: Payer: Self-pay | Admitting: Physical Medicine & Rehabilitation

## 2014-10-07 ENCOUNTER — Encounter: Payer: Medicare Other | Attending: Physical Medicine & Rehabilitation | Admitting: Physical Medicine & Rehabilitation

## 2014-10-07 ENCOUNTER — Other Ambulatory Visit: Payer: Self-pay | Admitting: Physical Medicine & Rehabilitation

## 2014-10-07 VITALS — BP 113/44 | HR 60 | Resp 14

## 2014-10-07 DIAGNOSIS — G8929 Other chronic pain: Secondary | ICD-10-CM | POA: Insufficient documentation

## 2014-10-07 DIAGNOSIS — Z5181 Encounter for therapeutic drug level monitoring: Secondary | ICD-10-CM | POA: Diagnosis not present

## 2014-10-07 DIAGNOSIS — Z79899 Other long term (current) drug therapy: Secondary | ICD-10-CM | POA: Diagnosis not present

## 2014-10-07 DIAGNOSIS — G500 Trigeminal neuralgia: Secondary | ICD-10-CM

## 2014-10-07 DIAGNOSIS — G894 Chronic pain syndrome: Secondary | ICD-10-CM

## 2014-10-07 DIAGNOSIS — S0452XS Injury of facial nerve, left side, sequela: Secondary | ICD-10-CM

## 2014-10-07 DIAGNOSIS — D333 Benign neoplasm of cranial nerves: Secondary | ICD-10-CM | POA: Diagnosis not present

## 2014-10-07 MED ORDER — TAPENTADOL HCL 50 MG PO TABS: 50.0000 mg | ORAL_TABLET | Freq: Two times a day (BID) | ORAL | Status: DC | PRN

## 2014-10-07 MED ORDER — METHADONE HCL 10 MG PO TABS: 5.0000 mg | ORAL_TABLET | Freq: Two times a day (BID) | ORAL | Status: DC

## 2014-10-07 NOTE — Patient Instructions (Addendum)
Consider cymbalta trial.   Have your thyroid hormones checked.

## 2014-10-07 NOTE — Progress Notes (Signed)
Subjective:    Patient ID: [name], female    DOB: August 14, 1945, 69 y.o.   MRN: [id number]  HPI  [name] is here in follow up of her vestibular schwannoma and trigeminal neuralgia. Things have remained stable from a pain and neuro standpoint.   She has gone back to work part time which has been good for her. She works 5 hours each day she's in.   She states that fatigue remains an issue. She feels depressed at times also given her chronic pain.     Pain Inventory Average Pain 8 Pain Right Now 8 My pain is constant, sharp, burning and tingling  In the last 24 hours, has pain interfered with the following? General activity 8 Relation with others 9 Enjoyment of life 9 What TIME of day is your pain at its worst? morning, daytime, evening Sleep (in general) Good  Pain is worse with: talking Pain improves with: heat/ice and medication Relief from Meds: 6  Mobility walk without assistance ability to climb steps?  yes do you drive?  yes transfers alone Do you have any goals in this area?  no  Function employed # of hrs/week 8-10 disabled: date disabled 2005 retired I need assistance with the following:  household duties and shopping Do you have any goals in this area?  no  Neuro/Psych weakness dizziness anxiety loss of taste or smell  Prior Studies Any changes since last visit?  no  Physicians involved in your care Any changes since last visit?  no   Family History  Problem Relation Age of Onset  . Hypertension Mother   . Transient ischemic attack Mother   . Coronary artery disease Father     CHF  . Transient ischemic attack Maternal Grandmother   . Diabetes Neg Hx   . Cancer Neg Hx   . Coronary artery disease Sister    Social History   Social History  . Marital Status: Married    Spouse Name: N/A  . Number of Children: 1  . Years of Education: N/A   Occupational History  . Retired    Social History Main Topics  . Smoking status: Never  Smoker   . Smokeless tobacco: Never Used  . Alcohol Use: No  . Drug Use: No  . Sexual Activity: Not Asked   Other Topics Concern  . None   Social History Narrative   Past Surgical History  Procedure Laterality Date  . Abdominal hysterectomy    . No colonoscopy      "scared"; SOC reviewed  . Lumbar disc surgery      Dr Eddie Dibbles  . 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 Trigeminal neuralgia   . Abnormality of gait   . Facial nerve disorder   . Myofascial pain   . Cervicalgia   . Brachial neuritis or radiculitis NOS   . Hyperlipidemia   . Hypertension   . Pneumonia 2005  . Hearing loss     Left ear  . Thyroid disease     hyprothyroidism   BP 113/44 mmHg  Pulse 60  Resp 14  SpO2 97%  Opioid Risk Score:   Fall Risk Score:  `1  Depression screen PHQ 2/9  Depression screen [facility] 2/9 05/22/2014 03/07/2014 12/15/2012  Decreased Interest 1 0 0  Down, Depressed, Hopeless 0 0 0  PHQ - 2 Score 1 0 0  Altered sleeping 1 - -  Tired, decreased energy 2 - -  Change in appetite 0 - -  Feeling bad or failure about yourself  0 - -  Trouble concentrating 1 - -  Moving slowly or fidgety/restless 0 - -  Suicidal thoughts 0 - -  PHQ-9 Score 5 - -     Review of Systems  Constitutional: Positive for unexpected weight change.       Loss of taste/smell  Cardiovascular: Positive for leg swelling.  Gastrointestinal: Positive for constipation.  Genitourinary: Positive for dysuria.  Neurological: Positive for dizziness and weakness.  Psychiatric/Behavioral: The patient is nervous/anxious.   All other systems reviewed and are negative.      Objective:   Physical Exam  HENT:  Head: Normocephalic and atraumatic.  Eyes: Conjunctivae and EOM are normal. Pupils are equal, round, and reactive to light. Left sclera slightly irritated.  Cardiovascular: Normal rate and regular rhythm.  Pulmonary/Chest: Effort  normal. No respiratory distress. She has no wheezes.  Abdominal: Bowel sounds are normal.  Musculoskeletal: Normal range of motion. No pain with right shoulder IR/ER Neurological: She is alert and oriented to person, place, and time. Balance still off, with occasional ataxia noted.  Left lid doe not close completely and she has persistent left facial droop. Left face remains somewhat hypersenstiive to touch. I see no neurological changes today.  Psychiatric: She has a normal mood and affect. Her behavior is normal. Judgment and thought content normal.   Assessment & Plan:   ASSESSMENT:  1. History of vestibular schwannoma resulting in trigeminal neuralgia and left facial nerve injury- no tumor regrowth per MRI--has chronic peri-operative scarring and atrophy which accounts for weakness, pain, and balance issues  2. History of cervicalgia  3. Chronic pain related to CN 5 involvement  4. Hypothyroid on supplementation  5. Right shoulder pain==resolved. Likely mild OA/RTC pathology    PLAN:  1. Methadone 10 mg 1.5-1 p.o. b.i.d., #75 was refilled with a second rx for next month.  2. Nucynta 50 mg 1 p.o. q.12 h. p.r.n. 30 with no refill for next month.  3. Thyroid supplement per primary. Needs levels checked soon. 5. She will continue with the voltaren which seems to really help.  6. Consider cymbalta trial for mood and nerve pain.  7. She'll follow up with me in 2 months. 15 minutes of face to face patient care time were spent during this visit. All questions were encouraged and answered. We reviewed the fact that any perceived facial changes are likely soft tissue,age related in nature. Neurologically she's stable

## 2014-10-08 LAB — PMP ALCOHOL METABOLITE (ETG): Ethyl Glucuronide (EtG): NEGATIVE ng/mL

## 2014-10-11 LAB — BENZODIAZEPINES (GC/LC/MS), URINE
Alprazolam metabolite (GC/LC/MS), ur confirm: NEGATIVE ng/mL (ref <25)
CLONAZEPAU: NEGATIVE ng/mL (ref <25)
Flurazepam metabolite (GC/LC/MS), ur confirm: NEGATIVE ng/mL (ref <50)
LORAZEPAMU: NEGATIVE ng/mL (ref <50)
Midazolam (GC/LC/MS), ur confirm: NEGATIVE ng/mL (ref <50)
NORDIAZEPAMU: NEGATIVE ng/mL (ref <50)
Oxazepam (GC/LC/MS), ur confirm: 231 ng/mL — AB (ref <50)
TEMAZEPAMU: NEGATIVE ng/mL (ref <50)
Triazolam metabolite (GC/LC/MS), ur confirm: NEGATIVE ng/mL (ref <50)

## 2014-10-11 LAB — METHADONE (GC/LC/MS), URINE
EDDP (GC/LC/MS), ur confirm: 2314 ng/mL (ref <100)
Methadone (GC/LC/MS), ur confirm: 1047 ng/mL (ref <100)

## 2014-10-12 LAB — PRESCRIPTION MONITORING PROFILE (SOLSTAS)
AMPHETAMINE/METH: NEGATIVE ng/mL
BARBITURATE SCREEN, URINE: NEGATIVE ng/mL
Buprenorphine, Urine: NEGATIVE ng/mL
COCAINE METABOLITES: NEGATIVE ng/mL
Cannabinoid Scrn, Ur: NEGATIVE ng/mL
Carisoprodol, Urine: NEGATIVE ng/mL
Creatinine, Urine: 95.37 mg/dL (ref >20.0)
Fentanyl, Ur: NEGATIVE ng/mL
MDMA URINE: NEGATIVE ng/mL
Meperidine, Ur: NEGATIVE ng/mL
Nitrites, Initial: NEGATIVE ug/mL
OXYCODONE SCRN UR: NEGATIVE ng/mL
Opiate Screen, Urine: NEGATIVE ng/mL
Propoxyphene: NEGATIVE ng/mL
TAPENTADOLUR: NEGATIVE ng/mL
Tramadol Scrn, Ur: NEGATIVE ng/mL
Zolpidem, Urine: NEGATIVE ng/mL
pH, Initial: 5.7 pH (ref 4.5–8.9)

## 2014-10-23 NOTE — Progress Notes (Signed)
Urine drug screen for this encounter is consistent for prescribed medication 

## 2014-11-25 ENCOUNTER — Encounter: Payer: Self-pay | Admitting: Internal Medicine

## 2014-11-26 ENCOUNTER — Other Ambulatory Visit: Payer: Self-pay | Admitting: Emergency Medicine

## 2014-11-26 DIAGNOSIS — E039 Hypothyroidism, unspecified: Secondary | ICD-10-CM

## 2014-11-26 MED ORDER — LEVOTHYROXINE SODIUM 25 MCG PO TABS: ORAL_TABLET | ORAL | Status: DC

## 2014-12-04 ENCOUNTER — Encounter: Payer: Medicare Other | Admitting: Physical Medicine & Rehabilitation

## 2015-01-01 ENCOUNTER — Encounter: Payer: Medicare Other | Admitting: Physical Medicine & Rehabilitation

## 2015-01-13 ENCOUNTER — Encounter: Payer: Self-pay | Admitting: Physical Medicine & Rehabilitation

## 2015-01-13 ENCOUNTER — Encounter: Payer: Medicare Other | Attending: Physical Medicine & Rehabilitation | Admitting: Physical Medicine & Rehabilitation

## 2015-01-13 VITALS — BP 176/65 | HR 76

## 2015-01-13 DIAGNOSIS — G500 Trigeminal neuralgia: Secondary | ICD-10-CM | POA: Diagnosis not present

## 2015-01-13 DIAGNOSIS — G8929 Other chronic pain: Secondary | ICD-10-CM | POA: Diagnosis not present

## 2015-01-13 DIAGNOSIS — D333 Benign neoplasm of cranial nerves: Secondary | ICD-10-CM | POA: Insufficient documentation

## 2015-01-13 DIAGNOSIS — E038 Other specified hypothyroidism: Secondary | ICD-10-CM | POA: Diagnosis not present

## 2015-01-13 DIAGNOSIS — F418 Other specified anxiety disorders: Secondary | ICD-10-CM

## 2015-01-13 DIAGNOSIS — S0451XS Injury of facial nerve, right side, sequela: Secondary | ICD-10-CM

## 2015-01-13 LAB — T3, FREE: T3, Free: 2.5 pg/mL (ref 2.3–4.2)

## 2015-01-13 LAB — TSH: TSH: 4.248 u[IU]/mL (ref 0.350–4.500)

## 2015-01-13 LAB — T4, FREE: Free T4: 1.09 ng/dL (ref 0.80–1.80)

## 2015-01-13 MED ORDER — METHADONE HCL 10 MG PO TABS: 5.0000 mg | ORAL_TABLET | Freq: Two times a day (BID) | ORAL | Status: DC

## 2015-01-13 MED ORDER — TAPENTADOL HCL 50 MG PO TABS: 50.0000 mg | ORAL_TABLET | Freq: Two times a day (BID) | ORAL | Status: DC | PRN

## 2015-01-13 MED ORDER — CITALOPRAM HYDROBROMIDE 10 MG PO TABS: 10.0000 mg | ORAL_TABLET | Freq: Every day | ORAL | Status: DC

## 2015-01-13 NOTE — Patient Instructions (Signed)
PLEASE CALL ME WITH ANY PROBLEMS OR QUESTIONS (#[phone]). HAVE A HAPPY HOLIDAY SEASON!!!    

## 2015-01-13 NOTE — Progress Notes (Signed)
Subjective:    Patient ID: [name], female    DOB: 09-07-1945, 69 y.o.   MRN: [id number]  HPI   [name] is here in follow up of her trigeminal neuralgia and vestibular schwannoma. She has had worsening hearing in her right ear and has been fitted for a hearing aid. She has had auditory testing. Her vision has been worsening as well. She is concerned about her fatigue levels and balance. She hasn't had her thyroid tested for some time. [name] tells me that her head feels "full" quite often.  She continues to work 2 days per week and likes to stay active with her grandkids.   Pain Inventory Average Pain 9 Pain Right Now 6 My pain is sharp, burning, stabbing and tingling  In the last 24 hours, has pain interfered with the following? General activity 9 Relation with others 10 Enjoyment of life 10 What TIME of day is your pain at its worst? Daytime and Evening Sleep (in general) Good  Pain is worse with: Talking Pain improves with: rest, heat/ice and medication Relief from Meds: 8  Mobility walk without assistance ability to climb steps?  yes do you drive?  yes transfers alone Do you have any goals in this area?  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  yes  Neuro/Psych weakness trouble walking dizziness anxiety loss of taste or smell  Prior Studies Any changes since last visit?  no  Physicians involved in your care Any changes since last visit?  no   Family History  Problem Relation Age of Onset  . Hypertension Mother   . Transient ischemic attack Mother   . Coronary artery disease Father     CHF  . Transient ischemic attack Maternal Grandmother   . Diabetes Neg Hx   . Cancer Neg Hx   . Coronary artery disease Sister    Social History   Social History  . Marital Status: Married    Spouse Name: N/A  . Number of  Children: 1  . Years of Education: N/A   Occupational History  . Retired    Social History Main Topics  . Smoking status: Never Smoker   . Smokeless tobacco: Never Used  . Alcohol Use: No  . Drug Use: No  . Sexual Activity: Not Asked   Other Topics Concern  . None   Social History Narrative   Past Surgical History  Procedure Laterality Date  . Abdominal hysterectomy    . No colonoscopy      "scared"; SOC reviewed  . Lumbar disc surgery      Dr Eddie Dibbles  . 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 Trigeminal neuralgia   . Abnormality of gait   . Facial nerve disorder   . Myofascial pain   . Cervicalgia   . Brachial neuritis or radiculitis NOS   . Hyperlipidemia   . Hypertension   . Pneumonia 2005  . Hearing loss     Left ear  . Thyroid disease     hyprothyroidism   BP 176/65 mmHg  Pulse 76  SpO2 98%  Opioid Risk Score:   Fall Risk Score:  `1  Depression screen PHQ 2/9  Depression screen [facility] 2/9 05/22/2014 03/07/2014 12/15/2012  Decreased Interest 1 0 0  Down, Depressed, Hopeless 0 0 0  PHQ - 2 Score 1 0 0  Altered sleeping 1 - -  Tired, decreased energy 2 - -  Change in appetite 0 - -  Feeling bad or failure about yourself  0 - -  Trouble concentrating 1 - -  Moving slowly or fidgety/restless 0 - -  Suicidal thoughts 0 - -  PHQ-9 Score 5 - -     Review of Systems  Constitutional: Positive for unexpected weight change.  Cardiovascular: Positive for leg swelling.  Gastrointestinal: Positive for constipation.  Genitourinary:       Retention  Musculoskeletal: Positive for gait problem.  Neurological: Positive for dizziness and weakness.  Psychiatric/Behavioral: The patient is nervous/anxious.        Loss of taste or smeall  All other systems reviewed and are negative.      Objective:   Physical Exam  HENT:  Head: Normocephalic and atraumatic.  Eyes: Conjunctivae and EOM  are normal. Pupils are equal, round, and reactive to light. Left sclera slightly irritated.  Cardiovascular: Normal rate and regular rhythm.  Pulmonary/Chest: Effort normal. No respiratory distress. She has no wheezes.  Abdominal: Bowel sounds are normal.  Musculoskeletal: Normal range of motion. No pain with right shoulder IR/ER Neurological: She is alert and oriented to person, place, and time. Balance still off, with occasional ataxia noted.  Left lid doe not close completely and she has persistent left facial droop. Left face remains somewhat hypersenstiive to touch. She has a mild left intentional tremor. No gross ataxia. When she takes her time, her gait is quite stable---she can drift to the left on occasion. Marland Kitchen  Psychiatric: she appears a little more anxious than her baseline. ?mild depression also. Judgment and thought content normal.   Assessment & Plan:   ASSESSMENT:  1. History of vestibular schwannoma resulting in trigeminal neuralgia and left facial nerve injury- no tumor regrowth per MRI--has chronic peri-operative scarring and atrophy which accounts for weakness, pain, and balance issues. I see no neurological changes on exam today. 2. History of cervicalgia  3. Chronic pain related to CN 5 involvement  4. Hypothyroid on supplementation  5. Right shoulder pain==resolved. Likely mild OA/RTC pathology  6. Reactive depression and anxiety    PLAN:  1. Methadone 10 mg 1.5-1 p.o. b.i.d., #75 was refilled with a second rx for next month.  2. Nucynta 50 mg 1 p.o. q.12 h. p.r.n. 30 with no refill for next month.  3. Thyroid supplement per primary. Needs levels checked today---will order  5. She will continue with the voltaren which seems to really help.  6. Celexa 10mg  qhs for depression and anxiety 7. She'll follow up with me in 2 months. 15 minutes of face to face patient care time were spent during this visit. All questions were encouraged and answered. Encouraged to her to follow  through with eye exam.

## 2015-02-05 ENCOUNTER — Other Ambulatory Visit: Payer: Self-pay | Admitting: Internal Medicine

## 2015-03-05 ENCOUNTER — Ambulatory Visit (INDEPENDENT_AMBULATORY_CARE_PROVIDER_SITE_OTHER): Payer: Medicare Other | Admitting: Family

## 2015-03-05 ENCOUNTER — Encounter: Payer: Self-pay | Admitting: Family

## 2015-03-05 VITALS — BP 135/51 | HR 94 | Temp 98.5°F | Resp 16 | Ht 63.5 in | Wt 167.6 lb

## 2015-03-05 DIAGNOSIS — I10 Essential (primary) hypertension: Secondary | ICD-10-CM | POA: Diagnosis not present

## 2015-03-05 DIAGNOSIS — G500 Trigeminal neuralgia: Secondary | ICD-10-CM

## 2015-03-05 DIAGNOSIS — G4710 Hypersomnia, unspecified: Secondary | ICD-10-CM

## 2015-03-05 DIAGNOSIS — R400 Somnolence: Secondary | ICD-10-CM

## 2015-03-05 DIAGNOSIS — E039 Hypothyroidism, unspecified: Secondary | ICD-10-CM | POA: Diagnosis not present

## 2015-03-05 LAB — BASIC METABOLIC PANEL
BUN: 14 mg/dL (ref 6–23)
CO2: 29 mEq/L (ref 19–32)
Calcium: 10.1 mg/dL (ref 8.4–10.5)
Chloride: 103 mEq/L (ref 96–112)
Creatinine, Ser: 0.92 mg/dL (ref 0.40–1.20)
GFR: 63.93 mL/min (ref >60.00)
Glucose, Bld: 120 mg/dL — ABNORMAL HIGH (ref 70–99)
Potassium: 4.2 mEq/L (ref 3.5–5.1)
Sodium: 141 mEq/L (ref 135–145)

## 2015-03-05 LAB — TSH: TSH: 5.84 u[IU]/mL — AB (ref 0.35–4.50)

## 2015-03-05 MED ORDER — CLORAZEPATE DIPOTASSIUM 7.5 MG PO TABS: 7.5000 mg | ORAL_TABLET | ORAL | Status: DC | PRN

## 2015-03-05 NOTE — Assessment & Plan Note (Signed)
She is followed by Dr. Tessa Lerner for pain management.

## 2015-03-05 NOTE — Progress Notes (Signed)
Pre visit review using our clinic review tool, if applicable. No additional management support is needed unless otherwise documented below in the visit note. 

## 2015-03-05 NOTE — Patient Instructions (Addendum)
Please complete lab work prior to leaving. Try beginning citalopram to help with your hot flashes.

## 2015-03-05 NOTE — Progress Notes (Signed)
Subjective:    Patient ID: [name], female    DOB: 05-20-1945, 70 y.o.   MRN: [id number]  HPI  [name] is a 70 yr old female who presents today to establish care. She was previously followed by Dr. Linna Darner.  Pmhx includes:  1) Hypothyroid- maintained on levothyroxine.  Lab Results  Component Value Date   TSH 4.248 01/13/2015   2) HTN- maintained on hyzaar.  BP Readings from Last 3 Encounters:  03/05/15 135/51  01/13/15 176/65  10/07/14 113/44   3) chronic pain (due to vestibular schwannoma/trigeminal neuralgia).   She is maintained on methadone and nucynta.  These medications are managed by Dr. Tawni Levy.   ? Anxiety-  Pt was given rx by Dr. Tessa Lerner for citalopram for anxiety. Pt reports that she really isn't that anxious, just suffers from pain.  She did not start citalopram after reading potential side effects.  Denies panic attacks.  Works 2 days a week at [employer].  Tries to stay active.  She reports that she doses off when she sits down.  Patient does not snore.  Son has sleep apnea.  She has gained weight recently. She denies depression.  Denies anxiety   Review of Systems She reports that she started having hot flashes since November.  She ran out of her estrace in the end of October and did not follow back up with her GYN  Past Medical History  Diagnosis Date  . Trigeminal neuralgia   . Abnormality of gait   . Facial nerve disorder   . Myofascial pain   . Cervicalgia   . Brachial neuritis or radiculitis NOS   . Hyperlipidemia   . Hypertension   . Pneumonia 2005  . Hearing loss     Left ear  . Thyroid disease     hyprothyroidism  . 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 Macular degeneration     Social History   Social History  . Marital Status: Married    Spouse Name: [name]  . Number of Children: 1  . Years of Education: [name]   Occupational History  . Retired    Social History Main Topics  . Smoking status: Never  Smoker   . Smokeless tobacco: Never Used  . Alcohol Use: No  . Drug Use: No  . Sexual Activity: Not on file   Other Topics Concern  . Not on file   Social History Narrative    Past Surgical History  Procedure Laterality Date  . Abdominal hysterectomy    . No colonoscopy      "scared"; SOC reviewed  . Lumbar disc surgery      Dr Eddie Dibbles  . 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 Cardiac surgery  1955    closed hole in heart    Family History  Problem Relation Age of Onset  . Hypertension Mother   . Transient ischemic attack Mother   . Coronary artery disease Father     CHF  . Transient ischemic attack Maternal Grandmother   . Diabetes Neg Hx   . Cancer Neg Hx   . Coronary artery disease Sister   . Macular degeneration Sister     Allergies  Allergen Reactions  . Morphine     REACTION: GI upset    Current Outpatient Prescriptions on File Prior to Visit  Medication Sig Dispense Refill  .  BIOTIN 5000 PO Take by mouth.    . levothyroxine (LEVOTHROID) 25 MCG tablet 1 qd except 1& 1/2 Tues & Th 102 tablet 0  . losartan-hydrochlorothiazide (HYZAAR) 100-12.5 MG tablet TAKE ONE TABLET BY MOUTH ONCE DAILY 90 tablet 0  . methadone (DOLOPHINE) 10 MG tablet Take 0.5-1 tablets (5-10 mg total) by mouth 2 (two) times daily. 60 tablet 0  . Multiple Vitamins-Minerals (PRESERVISION AREDS PO) Take by mouth.    . tapentadol (NUCYNTA) 50 MG TABS tablet Take 1 tablet (50 mg total) by mouth every 12 (twelve) hours as needed. 30 tablet 0  . citalopram (CELEXA) 10 MG tablet Take 1 tablet (10 mg total) by mouth at bedtime. (Patient not taking: Reported on 03/05/2015) 30 tablet 2  . clorazepate (TRANXENE) 7.5 MG tablet Take 7.5 mg by mouth as needed. Reported on 03/05/2015    . estradiol (ESTRACE) 1 MG tablet TAKE 1 TABLET BY MOUTH TWICE DAILY (Patient not taking: Reported on 03/05/2015) 60 tablet 0   No current facility-administered medications on file prior  to visit.    BP 135/51 mmHg  Pulse 94  Temp(Src) 98.5 F (36.9 C) (Oral)  Resp 16  Ht 5' 3.5" (1.613 m)  Wt 167 lb 9.6 oz (76.023 kg)  BMI 29.22 kg/m2  SpO2 100%       Objective:   Physical Exam  Constitutional: She is oriented to person, place, and time. She appears well-developed and well-nourished.  HENT:  Head: Normocephalic and atraumatic.  Eyes: No scleral icterus.  Cardiovascular: Normal rate, regular rhythm and normal heart sounds.   No murmur heard. Pulmonary/Chest: Effort normal and breath sounds normal. No respiratory distress. She has no wheezes.  Musculoskeletal: She exhibits no edema.  Neurological: She is alert and oriented to person, place, and time.  Left sided facial paralysis is noted  Skin: Skin is warm and dry.  Psychiatric: She has a normal mood and affect. Her behavior is normal. Judgment and thought content normal.          Assessment & Plan:  Daytime somnolence- concerning for OSA- will arrange home sleep study for further evaluation.   Hot flashes-  I told patient that I was not comfortable restarting her estrace due to her age and her having been off for some time. I did offer referral to GYN but she declines at this time. She wishes to remain off of estrogen. I did encourage her to start the citalopram rx and I think this will help with her hot flashes.  We discussed common side effects. She will tells me she will give it a try.

## 2015-03-05 NOTE — Assessment & Plan Note (Signed)
Stable on Hyzaar. Continue same.

## 2015-03-06 ENCOUNTER — Telehealth: Payer: Self-pay | Admitting: Family

## 2015-03-06 DIAGNOSIS — E039 Hypothyroidism, unspecified: Secondary | ICD-10-CM

## 2015-03-06 MED ORDER — LEVOTHYROXINE SODIUM 25 MCG PO TABS: ORAL_TABLET | ORAL | Status: DC

## 2015-03-06 NOTE — Telephone Encounter (Signed)
Left a detailed message for pt to call back with questions about the note below.

## 2015-03-06 NOTE — Telephone Encounter (Signed)
Please contact patient and let her know that I reviewed her thyroid test and we need to increase her synthroid.  I recommend that she take a full tablet by mouth of the 25 mcg once daily. Repeat TSH in 6 weeks.

## 2015-03-07 NOTE — Telephone Encounter (Signed)
Attempted to reach pt and left message for her to check her mychart acct. Below instructions sent to pt.

## 2015-03-10 ENCOUNTER — Encounter: Payer: Self-pay | Admitting: Physical Medicine & Rehabilitation

## 2015-03-10 ENCOUNTER — Encounter: Payer: Medicare Other | Attending: Physical Medicine & Rehabilitation | Admitting: Physical Medicine & Rehabilitation

## 2015-03-10 VITALS — BP 133/76 | HR 76

## 2015-03-10 DIAGNOSIS — Z79899 Other long term (current) drug therapy: Secondary | ICD-10-CM | POA: Diagnosis not present

## 2015-03-10 DIAGNOSIS — D333 Benign neoplasm of cranial nerves: Secondary | ICD-10-CM | POA: Diagnosis not present

## 2015-03-10 DIAGNOSIS — G500 Trigeminal neuralgia: Secondary | ICD-10-CM

## 2015-03-10 DIAGNOSIS — G894 Chronic pain syndrome: Secondary | ICD-10-CM | POA: Diagnosis not present

## 2015-03-10 DIAGNOSIS — S0451XS Injury of facial nerve, right side, sequela: Secondary | ICD-10-CM

## 2015-03-10 DIAGNOSIS — G8929 Other chronic pain: Secondary | ICD-10-CM | POA: Insufficient documentation

## 2015-03-10 DIAGNOSIS — Z5181 Encounter for therapeutic drug level monitoring: Secondary | ICD-10-CM | POA: Diagnosis not present

## 2015-03-10 MED ORDER — METHADONE HCL 10 MG PO TABS: 5.0000 mg | ORAL_TABLET | Freq: Two times a day (BID) | ORAL | Status: DC

## 2015-03-10 MED ORDER — TAPENTADOL HCL 50 MG PO TABS: 50.0000 mg | ORAL_TABLET | Freq: Two times a day (BID) | ORAL | Status: DC | PRN

## 2015-03-10 NOTE — Patient Instructions (Signed)
  PLEASE CALL ME WITH ANY PROBLEMS OR QUESTIONS (#[phone]).      

## 2015-03-10 NOTE — Progress Notes (Signed)
Subjective:    Patient ID: [name], female    DOB: [birth year], 70 y.o.   MRN: [id number]  HPI   Ronika is here in follow up of her chronic facial pain. Her methadone is working fairly well but doesn't stop the severe pain. The nucynta works well for that, but makes her sleepy.  She hasn't had any changes neurologically. She is still working part time to keep herself busy.  She has seen her primary who is following her endocrine issues. She still complains of some fatigue. Depression/anxiety remain issues at times also---she was afraid ot start the celexa after reading about SE.      Pain Inventory Average Pain 7 Pain Right Now 5 My pain is constant, sharp, burning, stabbing and tingling  In the last 24 hours, has pain interfered with the following? General activity 9 Relation with others 10 Enjoyment of life 10 What TIME of day is your pain at its worst? daytime Sleep (in general) Good  Pain is worse with: inactivity and talking Pain improves with: rest, heat/ice and medication Relief from Meds: 8  Mobility walk without assistance ability to climb steps?  yes do you drive?  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  no  Physicians involved in your care Any changes since last visit?  no   Family History  Problem Relation Age of Onset  . Hypertension Mother   . Transient ischemic attack Mother   . Coronary artery disease Father     CHF  . Transient ischemic attack Maternal Grandmother   . Diabetes Neg Hx   . Cancer Neg Hx   . Coronary artery disease Sister   . Macular degeneration Sister    Social History   Social History  . Marital Status: Married    Spouse Name: N/A  . Number of Children: 1  . Years of Education: N/A   Occupational  History  . Retired    Social History Main Topics  . Smoking status: Never Smoker   . Smokeless tobacco: Never Used  . Alcohol Use: No  . Drug Use: No  . Sexual Activity: Not Asked   Other Topics Concern  . None   Social History Narrative   Past Surgical History  Procedure Laterality Date  . Abdominal hysterectomy    . No colonoscopy      "scared"; SOC reviewed  . Lumbar disc surgery      Dr Eddie Dibbles  . 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 Cardiac surgery  1955    closed hole in heart   Past Medical History  Diagnosis Date  . Trigeminal neuralgia   . Abnormality of gait   . Facial nerve disorder   . Myofascial pain   . Cervicalgia   . Brachial neuritis or radiculitis NOS   . Hyperlipidemia   . Hypertension   . Pneumonia 2005  . Hearing loss     Left ear  . Thyroid disease     hyprothyroidism  . History of brain tumor 2005    at base of brain stem  . Macular degeneration    There were no vitals taken for this visit.  Opioid Risk Score:   Fall Risk Score:  `  1  Depression screen PHQ 2/9  Depression screen [facility] 2/9 05/22/2014 03/07/2014 12/15/2012  Decreased Interest 1 0 0  Down, Depressed, Hopeless 0 0 0  PHQ - 2 Score 1 0 0  Altered sleeping 1 - -  Tired, decreased energy 2 - -  Change in appetite 0 - -  Feeling bad or failure about yourself  0 - -  Trouble concentrating 1 - -  Moving slowly or fidgety/restless 0 - -  Suicidal thoughts 0 - -  PHQ-9 Score 5 - -     Review of Systems  Constitutional: Positive for diaphoresis and unexpected weight change.       Off estrogen and "thrown into hot flashes again"  Cardiovascular: Positive for leg swelling.  Gastrointestinal: Positive for constipation.  Genitourinary: Positive for difficulty urinating.  Neurological: Positive for weakness.       Weakness in legs  All other systems reviewed and are negative.      Objective:   Physical Exam  HENT:  Head:  Normocephalic and atraumatic.  Eyes: Conjunctivae and EOM are normal. Pupils are equal, round, and reactive to light. Left sclera slightly irritated.  Cardiovascular: Normal rate and regular rhythm.  Pulmonary/Chest: Effort normal. No respiratory distress. She has no wheezes.  Abdominal: Bowel sounds are normal.  Musculoskeletal: Normal range of motion. No pain with right shoulder IR/ER Neurological: She is alert and oriented to person, place, and time. Balance still off, with occasional ataxia noted.  Left lid doe not close completely and she has persistent left facial droop. Left face remains somewhat hypersenstiive to touch. She has a mild left intentional tremor. No gross ataxia. When she takes her time, her gait is quite stable---she can drift to the left on occasion. Marland Kitchen  Psychiatric: she appears a little more anxious than her baseline. ?mild depression also. Judgment and thought content normal.   Assessment & Plan:   ASSESSMENT:  1. History of vestibular schwannoma resulting in trigeminal neuralgia and left facial nerve injury- no tumor regrowth per MRI--has chronic peri-operative scarring and atrophy which accounts for weakness, pain, and balance issues. I see no neurological changes on exam today.  2. History of cervicalgia  3. Chronic pain related to CN 5 involvement  4. Hypothyroid on supplementation  5. Right shoulder pain==resolved. Likely mild OA/RTC pathology  6. Reactive depression and anxiety    PLAN:  1. Methadone 10 mg 1.5-1 p.o. b.i.d., #75 was refilled with a second rx for next month. Discussed using this primarily in the morning and the nucynta at night 2. Nucynta 50 mg 1 p.o. q.12 h. p.r.n. 30 with no refill for next month.  3. Thyroid/hormone supplement per primary.    5. She will continue with the voltaren which seems to really help.  6. Mood per primary--initiate rx if she wishes---will not push. I do think that depression and anxiety remain an issue however. 7.  She'll follow up with me in 2 months. 15 minutes of face to face patient care time were spent during this visit. All questions were encouraged and answered. Encouraged to her to follow through with eye exam.

## 2015-03-16 LAB — TOXASSURE SELECT,+ANTIDEPR,UR: PDF: 0

## 2015-03-18 DIAGNOSIS — J301 Allergic rhinitis due to pollen: Secondary | ICD-10-CM | POA: Diagnosis not present

## 2015-03-18 DIAGNOSIS — J0100 Acute maxillary sinusitis, unspecified: Secondary | ICD-10-CM | POA: Diagnosis not present

## 2015-03-18 NOTE — Progress Notes (Signed)
Urine drug screen for this encounter is consistent for prescribed medication. Oxazepam present but has an RX for Tranxene for which this is a metabolite.

## 2015-04-21 DIAGNOSIS — D3610 Benign neoplasm of peripheral nerves and autonomic nervous system, unspecified: Secondary | ICD-10-CM | POA: Diagnosis not present

## 2015-04-21 DIAGNOSIS — K1379 Other lesions of oral mucosa: Secondary | ICD-10-CM | POA: Diagnosis not present

## 2015-04-21 DIAGNOSIS — N951 Menopausal and female climacteric states: Secondary | ICD-10-CM | POA: Diagnosis not present

## 2015-05-01 DIAGNOSIS — G4733 Obstructive sleep apnea (adult) (pediatric): Secondary | ICD-10-CM | POA: Diagnosis not present

## 2015-05-05 ENCOUNTER — Other Ambulatory Visit: Payer: Self-pay | Admitting: Internal Medicine

## 2015-05-05 ENCOUNTER — Encounter: Payer: Self-pay | Admitting: Physical Medicine & Rehabilitation

## 2015-05-05 ENCOUNTER — Encounter: Payer: Medicare Other | Attending: Physical Medicine & Rehabilitation | Admitting: Physical Medicine & Rehabilitation

## 2015-05-05 VITALS — BP 142/70 | HR 84 | Resp 14

## 2015-05-05 DIAGNOSIS — G500 Trigeminal neuralgia: Secondary | ICD-10-CM | POA: Diagnosis not present

## 2015-05-05 DIAGNOSIS — S0452XS Injury of facial nerve, left side, sequela: Secondary | ICD-10-CM | POA: Diagnosis not present

## 2015-05-05 DIAGNOSIS — D333 Benign neoplasm of cranial nerves: Secondary | ICD-10-CM | POA: Diagnosis not present

## 2015-05-05 DIAGNOSIS — G8929 Other chronic pain: Secondary | ICD-10-CM | POA: Diagnosis not present

## 2015-05-05 MED ORDER — METHADONE HCL 10 MG PO TABS: 5.0000 mg | ORAL_TABLET | Freq: Two times a day (BID) | ORAL | Status: DC

## 2015-05-05 MED ORDER — TAPENTADOL HCL 50 MG PO TABS: 50.0000 mg | ORAL_TABLET | Freq: Two times a day (BID) | ORAL | Status: DC | PRN

## 2015-05-05 NOTE — Progress Notes (Signed)
Subjective:    Patient ID: [name], female    DOB: Sep 07, 1945, 70 y.o.   MRN: [id number]  HPI   [name] is here in follow up of her trigeminal neuralgia and associated pain. She has noticed more problems with the vision through her left eye. She has the gold weight currently in the lid. She doesn't wear a patch at night. She has some cataracts in both eyes as well.   Her pain levels have been fairly stable. Her pain does increase in the cooler weather. It often bothers her more at night. She continues with her methadone and nucynta as prescribed. She tries to wait to take the nucynta until her pain is severe.   Pain Inventory Average Pain 7 Pain Right Now 6 My pain is burning, dull, stabbing and tingling  In the last 24 hours, has pain interfered with the following? General activity 8 Relation with others 10 Enjoyment of life 10 What TIME of day is your pain at its worst? daytime, evening  Sleep (in general) Good  Pain is worse with: talking  Pain improves with: heat/ice and medication Relief from Meds: 9  Mobility walk without assistance ability to climb steps?  yes do you drive?  yes transfers alone Do you have any goals in this area?  no  Function employed # of hrs/week 12-16 disabled: date disabled 2005 I need assistance with the following:  household duties and shopping Do you have any goals in this area?  no  Neuro/Psych weakness dizziness loss of taste or smell  Prior Studies Any changes since last visit?  no CT/MRI  Physicians involved in your care Any changes since last visit?  yes Primary care .   Family History  Problem Relation Age of Onset  . Hypertension Mother   . Transient ischemic attack Mother   . Coronary artery disease Father     CHF  . Transient ischemic attack Maternal Grandmother   . Diabetes Neg Hx   . Cancer Neg Hx   . Coronary artery disease Sister   . Macular degeneration Sister    Social History   Social  History  . Marital Status: Married    Spouse Name: N/A  . Number of Children: 1  . Years of Education: N/A   Occupational History  . Retired    Social History Main Topics  . Smoking status: Never Smoker   . Smokeless tobacco: Never Used  . Alcohol Use: No  . Drug Use: No  . Sexual Activity: Not Asked   Other Topics Concern  . None   Social History Narrative   Past Surgical History  Procedure Laterality Date  . Abdominal hysterectomy    . No colonoscopy      "scared"; SOC reviewed  . Lumbar disc surgery      Dr Eddie Dibbles  . 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 Cardiac surgery  1955    closed hole in heart   Past Medical History  Diagnosis Date  . Trigeminal neuralgia   . Abnormality of gait   . Facial nerve disorder   . Myofascial pain   . Cervicalgia   . Brachial neuritis or radiculitis NOS   . Hyperlipidemia   . Hypertension   . Pneumonia 2005  . Hearing loss     Left ear  . Thyroid disease     hyprothyroidism  . 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 Macular degeneration    BP 142/70 mmHg  Pulse 84  Resp 14  Opioid Risk Score:   Fall Risk Score:  `1  Depression screen PHQ 2/9  Depression screen [facility] 2/9 05/22/2014 03/07/2014 12/15/2012  Decreased Interest 1 0 0  Down, Depressed, Hopeless 0 0 0  PHQ - 2 Score 1 0 0  Altered sleeping 1 - -  Tired, decreased energy 2 - -  Change in appetite 0 - -  Feeling bad or failure about yourself  0 - -  Trouble concentrating 1 - -  Moving slowly or fidgety/restless 0 - -  Suicidal thoughts 0 - -  PHQ-9 Score 5 - -     Review of Systems  Constitutional: Positive for unexpected weight change.       Loss of taste or smell  Cardiovascular:       Limb swelling   Gastrointestinal: Positive for constipation.  Genitourinary: Positive for difficulty urinating.  Neurological: Positive for dizziness and weakness.  All other systems reviewed and are  negative.      Objective:   Physical Exam  HENT:  Head: Normocephalic and atraumatic.  Eyes: Conjunctivae and EOM are normal. Pupils are equal, round, and reactive to light. Left sclera minimally irritated.  Cardiovascular: Normal rate and regular rhythm.  Pulmonary/Chest: Effort normal. No respiratory distress. She has no wheezes.  Abdominal: Bowel sounds are normal.  Musculoskeletal: Normal range of motion. No pain with right shoulder IR/ER Neurological: She is alert and oriented to person, place, and time. Balance still off, with occasional ataxia noted.  Left lid almost closes with weight. she has persistent left facial droop. Left face remains somewhat hypersenstiive to touch. She has a mild left intentional tremor. No gross ataxia. When she takes her time, her gait is quite stable---she can drift to the left on occasion. Marland Kitchen  Psychiatric: mood is bright today. Less anxious.   Assessment & Plan:   ASSESSMENT:  1. History of vestibular schwannoma resulting in trigeminal neuralgia and left facial nerve injury- no tumor regrowth per MRI--has chronic peri-operative scarring and atrophy which accounts for weakness, pain, and balance issues. I see no neurological changes on exam today.  2. History of cervicalgia  3. Chronic pain related to CN 5 involvement  4. Hypothyroid on supplementation  5. Right shoulder pain==resolved. Likely mild OA/RTC pathology  6. Reactive depression and anxiety    PLAN:  1. Methadone 10 mg 1.5-1 p.o. b.i.d., #75 was refilled with a second rx for next month. Discussed using this primarily in the morning and the nucynta at night   -may want to go back to wearing a patch at night.  -discussed the possibly of trying [place] Nucynta to replace methadone and perhaps the IR nucynta.  -consider custom compounded cream  -?PENS trial  2. Nucynta 50 mg 1 p.o. q.12 h. p.r.n. 30 with no refill for next month.  3. Thyroid/hormone supplement per primary.  5. She will continue  with the voltaren gel for face.  6. Mood per primary- I do think that depression and anxiety remain an issue at times.  7. She'll follow up with me in 2 months. 15 minutes of face to face patient care time were spent during this visit. All questions were encouraged and answered. Encouraged to her to follow through with eye exam.

## 2015-05-05 NOTE — Patient Instructions (Signed)
PLEASE CALL ME WITH ANY PROBLEMS OR QUESTIONS [phone]).     CONSIDER EYE PATCH AT NIGHT.

## 2015-05-05 NOTE — Telephone Encounter (Signed)
Routing refill rx request to new pcp

## 2015-05-14 DIAGNOSIS — G4733 Obstructive sleep apnea (adult) (pediatric): Secondary | ICD-10-CM | POA: Diagnosis not present

## 2015-05-15 ENCOUNTER — Other Ambulatory Visit: Payer: Self-pay | Admitting: *Deleted

## 2015-05-15 DIAGNOSIS — R400 Somnolence: Secondary | ICD-10-CM

## 2015-05-20 ENCOUNTER — Telehealth: Payer: Self-pay | Admitting: Family

## 2015-05-20 NOTE — Telephone Encounter (Signed)
Sleep study shows mild sleep apnea.  For this level of sleep apnea she does not need cpap.  She is on several sedating medications.  I would recommend that she try discontinuing tranxene to see if this helps with her sleepiness.

## 2015-05-21 NOTE — Telephone Encounter (Signed)
Left message with pt's spouse to have pt return my call.

## 2015-05-21 NOTE — Telephone Encounter (Signed)
Patient returning your call best time after 2pm.

## 2015-05-21 NOTE — Telephone Encounter (Signed)
Notified pt and she voices understanding. 

## 2015-06-04 DIAGNOSIS — E039 Hypothyroidism, unspecified: Secondary | ICD-10-CM | POA: Diagnosis not present

## 2015-06-04 DIAGNOSIS — Z780 Asymptomatic menopausal state: Secondary | ICD-10-CM | POA: Diagnosis not present

## 2015-07-07 ENCOUNTER — Encounter: Payer: Medicare Other | Attending: Physical Medicine & Rehabilitation | Admitting: Physical Medicine & Rehabilitation

## 2015-07-07 ENCOUNTER — Encounter: Payer: Self-pay | Admitting: Physical Medicine & Rehabilitation

## 2015-07-07 VITALS — BP 153/57 | HR 76 | Resp 15

## 2015-07-07 DIAGNOSIS — G500 Trigeminal neuralgia: Secondary | ICD-10-CM

## 2015-07-07 DIAGNOSIS — G894 Chronic pain syndrome: Secondary | ICD-10-CM

## 2015-07-07 DIAGNOSIS — S0452XS Injury of facial nerve, left side, sequela: Secondary | ICD-10-CM

## 2015-07-07 DIAGNOSIS — Z5181 Encounter for therapeutic drug level monitoring: Secondary | ICD-10-CM

## 2015-07-07 DIAGNOSIS — Z79899 Other long term (current) drug therapy: Secondary | ICD-10-CM | POA: Diagnosis not present

## 2015-07-07 DIAGNOSIS — G8929 Other chronic pain: Secondary | ICD-10-CM | POA: Diagnosis not present

## 2015-07-07 DIAGNOSIS — D333 Benign neoplasm of cranial nerves: Secondary | ICD-10-CM | POA: Insufficient documentation

## 2015-07-07 MED ORDER — TAPENTADOL HCL 50 MG PO TABS: 50.0000 mg | ORAL_TABLET | Freq: Two times a day (BID) | ORAL | Status: DC | PRN

## 2015-07-07 MED ORDER — METHADONE HCL 10 MG PO TABS: 5.0000 mg | ORAL_TABLET | Freq: Two times a day (BID) | ORAL | Status: DC

## 2015-07-07 MED ORDER — DICLOFENAC SODIUM 1 % TD GEL: 1.0000 "application " | Freq: Three times a day (TID) | TRANSDERMAL | Status: DC

## 2015-07-07 NOTE — Progress Notes (Signed)
Subjective:    Patient ID: [name], female    DOB: 1945-03-12, 70 y.o.   MRN: [id number]  HPI   [name] is here in follow up of her chronic pain. Her pain has been holding steady. She occasionally has a dizzy spell when she changes positions quickly. The symptoms typically resolve quicky on there own.   She has maintained on the low dose methadone as well as the nucynta for more severe, night-time pain. She uses the voltaren gel also for topical relief which remains effective.   [name] has noticed that her left facial droop is a little worse. She has been drooling more at night when she sleeps.     Pain Inventory Average Pain 7 Pain Right Now 5 My pain is sharp, burning, stabbing and tingling  In the last 24 hours, has pain interfered with the following? General activity 8 Relation with others 10 Enjoyment of life 10 What TIME of day is your pain at its worst? Daytime and Evening Sleep (in general) Good  Pain is worse with: Talking Pain improves with: heat/ice and medication Relief from Meds: 8  Mobility walk without assistance ability to climb steps?  yes do you drive?  yes transfers alone Do you have any goals in this area?  no  Function retired  Neuro/Psych weakness tingling  Prior Studies Any changes since last visit?  no  Physicians involved in your care Any changes since last visit?  yes   Family History  Problem Relation Age of Onset  . Hypertension Mother   . Transient ischemic attack Mother   . Coronary artery disease Father     CHF  . Transient ischemic attack Maternal Grandmother   . Diabetes Neg Hx   . Cancer Neg Hx   . Coronary artery disease Sister   . Macular degeneration Sister    Social History   Social History  . Marital Status: Married    Spouse Name: N/A  . Number of Children: 1  . Years of Education: N/A   Occupational History  . Retired    Social History Main Topics  . Smoking status: Never Smoker   .  Smokeless tobacco: Never Used  . Alcohol Use: No  . Drug Use: No  . Sexual Activity: Not Asked   Other Topics Concern  . None   Social History Narrative   Past Surgical History  Procedure Laterality Date  . Abdominal hysterectomy    . No colonoscopy      "scared"; SOC reviewed  . Lumbar disc surgery      Dr Eddie Dibbles  . 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 Cardiac surgery  1955    closed hole in heart   Past Medical History  Diagnosis Date  . Trigeminal neuralgia   . Abnormality of gait   . Facial nerve disorder   . Myofascial pain   . Cervicalgia   . Brachial neuritis or radiculitis NOS   . Hyperlipidemia   . Hypertension   . Pneumonia 2005  . Hearing loss     Left ear  . Thyroid disease     hyprothyroidism  . History of brain tumor 2005    at base of brain stem  . Macular degeneration    BP 153/57 mmHg  Pulse 76  Resp 15  SpO2 96%  Opioid Risk Score:   Fall Risk Score:  `1  Depression screen [facility] 2/9  Depression screen [facility] 2/9 05/22/2014 03/07/2014 12/15/2012  Decreased Interest 1 0 0  Down, Depressed, Hopeless 0 0 0  PHQ - 2 Score 1 0 0  Altered sleeping 1 - -  Tired, decreased energy 2 - -  Change in appetite 0 - -  Feeling bad or failure about yourself  0 - -  Trouble concentrating 1 - -  Moving slowly or fidgety/restless 0 - -  Suicidal thoughts 0 - -  PHQ-9 Score 5 - -      Review of Systems  Cardiovascular: Positive for leg swelling.  Neurological: Positive for weakness.       Tingling  All other systems reviewed and are negative.      Objective:   Physical Exam  HENT:  Head: Normocephalic and atraumatic.  Eyes: Conjunctivae and EOM are normal. Pupils are equal, round, and reactive to light. Left sclera minimally irritated.  Cardiovascular: Normal rate and regular rhythm.  Pulmonary/Chest: Effort normal. No respiratory distress. She has no wheezes.  Abdominal: Bowel sounds are normal.    Musculoskeletal: Normal range of motion. No pain with right shoulder IR/ER Neurological: She is alert and oriented to person, place, and time. Balance still off, with occasional ataxia noted.  Left lid almost closes with weight. she has persistent left facial droop. I did not notice any increase in the deficit.  Left face remains somewhat hypersenstiive to touch.  . No gross ataxia. Gait appears stable. ?mild left drift.  Psychiatric: mood is bright today. In good spirits.    Assessment & Plan:   ASSESSMENT:  1. History of vestibular schwannoma resulting in trigeminal neuralgia and left facial nerve injury- no tumor regrowth per MRI--has chronic peri-operative scarring and atrophy which accounts for weakness, pain, and balance issues. I see no neurological changes on exam today.  2. History of cervicalgia  3. Chronic pain related to CN 5 involvement  4. Hypothyroid on supplementation  5. Right shoulder pain==resolved. Likely mild OA/RTC pathology  6. Reactive depression and anxiety     PLAN:  1. Methadone 10 mg 1.5-1 p.o. b.i.d., #75 was refilled with a second rx for next month. Discussed using this primarily in the morning and the nucynta at night  -may want to go back to wearing a patch at night.  -can consider long acting Nucynta.  2. Nucynta 50 mg 1 p.o. q.12 h. p.r.n. 30 with no refill for next month.  3. Thyroid/hormone supplement per primary.  5. She will continue with the voltaren gel for face. Refilled today.  6. May benefit from a course of therapy for facial muscle strength/estim. Pt is considering but not ready to pursue at present. 7. She'll follow up with me in 2 months. 15 minutes of face to face patient care time were spent during this visit. All questions were encouraged and answered. Encouraged to her to follow through with eye exam.

## 2015-07-07 NOTE — Patient Instructions (Signed)
  PLEASE CALL ME WITH ANY PROBLEMS OR QUESTIONS (#[phone]).      

## 2015-07-09 ENCOUNTER — Telehealth: Payer: Self-pay | Admitting: Family

## 2015-07-09 NOTE — Telephone Encounter (Signed)
Left message for pt to return my call.

## 2015-07-09 NOTE — Telephone Encounter (Signed)
Please let pt know she is due for TSH and needs a 6 month follow up apt please this summer.

## 2015-07-10 NOTE — Telephone Encounter (Signed)
Left message for patient to return call.

## 2015-07-12 LAB — TOXASSURE SELECT,+ANTIDEPR,UR

## 2015-07-15 NOTE — Telephone Encounter (Signed)
Attempted to reach pt and left message to check mychart acct. Message sent. 

## 2015-07-18 ENCOUNTER — Telehealth: Payer: Self-pay | Admitting: *Deleted

## 2015-07-18 NOTE — Telephone Encounter (Signed)
Patients UDS contained a Oxazepam with a result value of 135.  Please advise

## 2015-07-18 NOTE — Telephone Encounter (Signed)
Please follow up with patient regarding source. thx

## 2015-07-24 NOTE — Telephone Encounter (Signed)
Left message for pt to call back before 2:00 pm today.

## 2015-07-25 NOTE — Telephone Encounter (Signed)
Pt returned call

## 2015-07-28 DIAGNOSIS — Z79899 Other long term (current) drug therapy: Secondary | ICD-10-CM | POA: Diagnosis not present

## 2015-07-28 DIAGNOSIS — E039 Hypothyroidism, unspecified: Secondary | ICD-10-CM | POA: Diagnosis not present

## 2015-07-28 DIAGNOSIS — E559 Vitamin D deficiency, unspecified: Secondary | ICD-10-CM | POA: Diagnosis not present

## 2015-07-31 NOTE — Telephone Encounter (Signed)
Returned call in regards to UDS results.

## 2015-08-01 ENCOUNTER — Ambulatory Visit (INDEPENDENT_AMBULATORY_CARE_PROVIDER_SITE_OTHER): Payer: Medicare Other | Admitting: Ophthalmology

## 2015-08-04 NOTE — Telephone Encounter (Signed)
Pt called returning my previous call. Left message to call back.

## 2015-08-12 DIAGNOSIS — H04122 Dry eye syndrome of left lacrimal gland: Secondary | ICD-10-CM | POA: Diagnosis not present

## 2015-08-12 DIAGNOSIS — H2511 Age-related nuclear cataract, right eye: Secondary | ICD-10-CM | POA: Diagnosis not present

## 2015-08-12 DIAGNOSIS — H02839 Dermatochalasis of unspecified eye, unspecified eyelid: Secondary | ICD-10-CM | POA: Diagnosis not present

## 2015-08-12 DIAGNOSIS — H353130 Nonexudative age-related macular degeneration, bilateral, stage unspecified: Secondary | ICD-10-CM | POA: Diagnosis not present

## 2015-08-12 DIAGNOSIS — H2512 Age-related nuclear cataract, left eye: Secondary | ICD-10-CM | POA: Diagnosis not present

## 2015-08-12 NOTE — Telephone Encounter (Signed)
Pt has an appt on 09/08/15 with ZS. Can not contact pt in regards to the UDS. Please address at next appt if this finding in concerning. Thanks!

## 2015-08-25 DIAGNOSIS — E039 Hypothyroidism, unspecified: Secondary | ICD-10-CM | POA: Diagnosis not present

## 2015-08-25 DIAGNOSIS — G894 Chronic pain syndrome: Secondary | ICD-10-CM | POA: Diagnosis not present

## 2015-08-25 DIAGNOSIS — I10 Essential (primary) hypertension: Secondary | ICD-10-CM | POA: Diagnosis not present

## 2015-09-08 ENCOUNTER — Encounter: Payer: Medicare Other | Attending: Physical Medicine & Rehabilitation | Admitting: Physical Medicine & Rehabilitation

## 2015-09-08 ENCOUNTER — Encounter: Payer: Self-pay | Admitting: Physical Medicine & Rehabilitation

## 2015-09-08 VITALS — BP 136/72 | HR 66

## 2015-09-08 DIAGNOSIS — D333 Benign neoplasm of cranial nerves: Secondary | ICD-10-CM | POA: Diagnosis not present

## 2015-09-08 DIAGNOSIS — G8929 Other chronic pain: Secondary | ICD-10-CM | POA: Diagnosis not present

## 2015-09-08 DIAGNOSIS — G500 Trigeminal neuralgia: Secondary | ICD-10-CM | POA: Diagnosis not present

## 2015-09-08 DIAGNOSIS — S0451XS Injury of facial nerve, right side, sequela: Secondary | ICD-10-CM | POA: Diagnosis not present

## 2015-09-08 MED ORDER — METHADONE HCL 10 MG PO TABS: 5.0000 mg | ORAL_TABLET | Freq: Two times a day (BID) | ORAL | 0 refills | Status: DC

## 2015-09-08 MED ORDER — TAPENTADOL HCL 50 MG PO TABS: 50.0000 mg | ORAL_TABLET | Freq: Two times a day (BID) | ORAL | 0 refills | Status: DC | PRN

## 2015-09-08 NOTE — Patient Instructions (Signed)
PLEASE CALL ME WITH ANY PROBLEMS OR QUESTIONS ([phone])  

## 2015-09-08 NOTE — Progress Notes (Signed)
Subjective:    Patient ID: [name], female    DOB: 03/05/1945, 70 y.o.   MRN: [id number]  HPI   [name] is here in follow up of her chronic pain. She has had a little more pain over the summer. She is busy taking care of her grandkids for several hours a day.   We found tranxene in her urine. She has a script from her PCP which she occasionally takes for sleep.  The nucynta is helpful for breakthrough nerve pain. She is using methadone daily.    Pain Inventory Average Pain 8 Pain Right Now 6 My pain is burning, stabbing, tingling and aching  In the last 24 hours, has pain interfered with the following? General activity 8 Relation with others 8 Enjoyment of life 9 What TIME of day is your pain at its worst? daytime and evening Sleep (in general) Good  Pain is worse with: talking Pain improves with: rest, heat/ice and medication Relief from Meds: 7  Mobility walk without assistance ability to climb steps?  yes do you drive?  yes transfers alone Do you have any goals in this area?  no  Function retired I need assistance with the following:  household duties and shopping Do you have any goals in this area?  no  Neuro/Psych dizziness  Prior Studies Any changes since last visit?  no  Physicians involved in your care Any changes since last visit?  no   Family History  Problem Relation Age of Onset  . Hypertension Mother   . Transient ischemic attack Mother   . Coronary artery disease Father     CHF  . Coronary artery disease Sister   . Macular degeneration Sister   . Transient ischemic attack Maternal Grandmother   . Diabetes Neg Hx   . Cancer Neg Hx    Social History   Social History  . Marital status: Married    Spouse name: N/A  . Number of children: 1  . Years of education: N/A   Occupational History  . Retired    Social History Main Topics  . Smoking status: Never Smoker  . Smokeless tobacco: Never Used  . Alcohol use No  . Drug  use: No  . Sexual activity: Not Asked   Other Topics Concern  . None   Social History Narrative  . None   Past Surgical History:  Procedure Laterality Date  . ABDOMINAL HYSTERECTOMY    . [place]   closed hole in heart  . LUMBAR DISC SURGERY     Dr Eddie Dibbles  . no colonoscopy     "scared"; SOC reviewed  . 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 Abnormality of gait   . Brachial neuritis or radiculitis NOS   . Cervicalgia   . Facial nerve disorder   . Hearing loss    Left ear  . 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 Hyperlipidemia   . Hypertension   . Macular degeneration   . Myofascial pain   . Pneumonia 2005  . Thyroid disease    hyprothyroidism  . Trigeminal neuralgia    There were no vitals taken for this visit.  Opioid Risk Score:   Fall Risk Score:  `1  Depression screen PHQ 2/9  Depression screen [facility] 2/9 05/22/2014 03/07/2014 12/15/2012  Decreased Interest 1  0 0  Down, Depressed, Hopeless 0 0 0  PHQ - 2 Score 1 0 0  Altered sleeping 1 - -  Tired, decreased energy 2 - -  Change in appetite 0 - -  Feeling bad or failure about yourself  0 - -  Trouble concentrating 1 - -  Moving slowly or fidgety/restless 0 - -  Suicidal thoughts 0 - -  PHQ-9 Score 5 - -    Review of Systems  Constitutional: Negative.   HENT: Negative.   Eyes: Negative.   Respiratory: Negative.   Cardiovascular: Negative.   Endocrine: Negative.   Genitourinary: Negative.   Musculoskeletal: Negative.   Skin: Negative.   Allergic/Immunologic: Negative.   Neurological: Positive for dizziness.  Hematological: Negative.   Psychiatric/Behavioral: Negative.        Objective:   Physical Exam     HENT:  Head: Normocephalic and atraumatic.  Eyes: Conjunctivae and EOM are normal. Pupils are equal, round, and reactive to light. Left sclera minimally irritated.  Cardiovascular:  Normal rate and regular rhythm.  Pulmonary/Chest: Effort normal. No respiratory distress. She has no wheezes.  Abdominal: Bowel sounds are normal.  Musculoskeletal: Normal range of motion. No pain with right shoulder IR/ER Neurological: She is alert and oriented to person, place, and time. Balance still off, with occasional ataxia noted.  Left lid almost closes with weight. she has persistent left facial droop. I did not notice any increase in the deficit.  Left face remains somewhat hypersenstiive to touch.  . No gross ataxia. Gait appears stable. ?mild left drift.  Psychiatric: mood is bright today. In good spirits.    Assessment & Plan:   ASSESSMENT:  1. History of vestibular schwannoma resulting in trigeminal neuralgia and left facial nerve injury- no tumor regrowth per MRI--has chronic peri-operative scarring and atrophy which accounts for weakness, pain, and balance issues. I see no neurological changes on exam today.  2. History of cervicalgia  3. Chronic pain related to CN 5 involvement  4. Hypothyroid on supplementation  5. Right shoulder pain==resolved. Likely mild OA/RTC pathology  6. Reactive depression and anxiety     PLAN:  1. Methadone 10 mg 1.5-1 p.o. b.i.d., #75 was refilled with a second rx for next month. Discussed using this primarily in the morning and the nucynta at night  -can consider long acting Nucynta if affordable 2. Nucynta 50 mg 1 p.o. q.12 h. p.r.n. 30 with no refill for next month.  3. Thyroid/hormone supplement per primary.  5. She will continue with the voltaren gel for face. Refilled today.  6. Consider a course of therapy for facial muscle strength/estim in the future. 7. She'll follow up with me in 2 months. 15 minutes of face to face patient care time were spent during this visit. All questions were encouraged and answered. Encouraged to her to follow through with eye exam.

## 2015-10-27 ENCOUNTER — Ambulatory Visit (INDEPENDENT_AMBULATORY_CARE_PROVIDER_SITE_OTHER): Payer: Medicare Other | Admitting: Ophthalmology

## 2015-10-28 ENCOUNTER — Telehealth: Payer: Self-pay | Admitting: Family

## 2015-10-28 NOTE — Telephone Encounter (Signed)
LM to schedule AWV with RN ° °

## 2015-11-17 ENCOUNTER — Ambulatory Visit: Payer: Medicare Other | Admitting: Physical Medicine & Rehabilitation

## 2015-11-24 ENCOUNTER — Encounter: Payer: Medicare Other | Attending: Physical Medicine & Rehabilitation | Admitting: Registered Nurse

## 2015-11-24 ENCOUNTER — Encounter: Payer: Self-pay | Admitting: Registered Nurse

## 2015-11-24 VITALS — BP 147/73 | HR 93 | Resp 14

## 2015-11-24 DIAGNOSIS — G500 Trigeminal neuralgia: Secondary | ICD-10-CM

## 2015-11-24 DIAGNOSIS — G894 Chronic pain syndrome: Secondary | ICD-10-CM

## 2015-11-24 DIAGNOSIS — G8929 Other chronic pain: Secondary | ICD-10-CM | POA: Insufficient documentation

## 2015-11-24 DIAGNOSIS — Z5181 Encounter for therapeutic drug level monitoring: Secondary | ICD-10-CM

## 2015-11-24 DIAGNOSIS — Z79899 Other long term (current) drug therapy: Secondary | ICD-10-CM

## 2015-11-24 DIAGNOSIS — D333 Benign neoplasm of cranial nerves: Secondary | ICD-10-CM | POA: Insufficient documentation

## 2015-11-24 MED ORDER — METHADONE HCL 10 MG PO TABS: 5.0000 mg | ORAL_TABLET | Freq: Two times a day (BID) | ORAL | 0 refills | Status: DC

## 2015-11-24 MED ORDER — TAPENTADOL HCL 50 MG PO TABS: 50.0000 mg | ORAL_TABLET | Freq: Two times a day (BID) | ORAL | 0 refills | Status: DC | PRN

## 2015-11-24 NOTE — Progress Notes (Signed)
Subjective:    Patient ID: [name], female    DOB: [birth year], 70 y.o.   MRN: [id number]  HPI: [name] is a 70 year old female who returns for follow up appointment and medication refill. She states her pain is left facial pain. She rates her pain 5. Her current exercise regime is walking and performing stretching exercises.   Pain Inventory Average Pain 7 Pain Right Now 5 My pain is burning, dull and tingling  In the last 24 hours, has pain interfered with the following? General activity 7 Relation with others 9 Enjoyment of life 9 What TIME of day is your pain at its worst? Daytime, evening Sleep (in general) Good  Pain is worse with: inactivity and Talking Pain improves with: rest, heat/ice and medication Relief from Meds: 7  Mobility walk without assistance ability to climb steps?  yes do you drive?  yes  Function employed # of hrs/week 10 hrs/week I need assistance with the following:  household duties and shopping  Neuro/Psych dizziness anxiety loss of taste or smell  Prior Studies Any changes since last visit?  no  Physicians involved in your care Any changes since last visit?  no   Family History  Problem Relation Age of Onset  . Hypertension Mother   . Transient ischemic attack Mother   . Coronary artery disease Father     CHF  . Coronary artery disease Sister   . Macular degeneration Sister   . Transient ischemic attack Maternal Grandmother   . Diabetes Neg Hx   . Cancer Neg Hx    Social History   Social History  . Marital status: Married    Spouse name: N/A  . Number of children: 1  . Years of education: N/A   Occupational History  . Retired    Social History Main Topics  . Smoking status: Never Smoker  . Smokeless tobacco: Never Used  . Alcohol use No  . Drug use: No  . Sexual activity: Not on file   Other Topics Concern  . Not on file   Social History Narrative  . No narrative on file   Past  Surgical History:  Procedure Laterality Date  . ABDOMINAL HYSTERECTOMY    . [place]   closed hole in heart  . LUMBAR DISC SURGERY     Dr Eddie Dibbles  . no colonoscopy     "scared"; SOC reviewed  . 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 Abnormality of gait   . Brachial neuritis or radiculitis NOS   . Cervicalgia   . Facial nerve disorder   . Hearing loss    Left ear  . 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 Hyperlipidemia   . Hypertension   . Macular degeneration   . Myofascial pain   . Pneumonia 2005  . Thyroid disease    hyprothyroidism  . Trigeminal neuralgia    There were no vitals taken for this visit.  Opioid Risk Score:   Fall Risk Score:  `1  Depression screen PHQ 2/9  Depression screen [facility] 2/9 05/22/2014 03/07/2014 12/15/2012  Decreased Interest 1 0 0  Down, Depressed, Hopeless 0 0 0  PHQ - 2 Score 1 0 0  Altered sleeping 1 - -  Tired, decreased energy 2 - -  Change in appetite  0 - -  Feeling bad or failure about yourself  0 - -  Trouble concentrating 1 - -  Moving slowly or fidgety/restless 0 - -  Suicidal thoughts 0 - -  PHQ-9 Score 5 - -    Review of Systems  Gastrointestinal: Positive for constipation.  Genitourinary: Positive for decreased urine volume.  All other systems reviewed and are negative.      Objective:   Physical Exam  Constitutional: She is oriented to person, place, and time. She appears well-developed and well-nourished.  HENT:  Head: Normocephalic and atraumatic.  Neck: Normal range of motion. Neck supple.  Cardiovascular: Normal rate and regular rhythm.   Pulmonary/Chest: Effort normal and breath sounds normal.  Musculoskeletal:  Normal Muscle Bulk and Muscle Testing Reveals: Upper Extremities: Full ROM and Muscle Strength 5/5 Lower Extremities: Full ROM and Muscle Strength 5/5 Arises from table with ease Narrow Based  gait  Neurological: She is alert and oriented to person, place, and time.  Skin: Skin is warm and dry.  Psychiatric: She has a normal mood and affect.  Nursing note and vitals reviewed.         Assessment & Plan:  1. History of vestibular schwannoma resulting in trigeminal neuralgia and left facial nerve injury Refilled: Methadone 10 mg ( 5-10 mg) BID and Nucunta 50 mg one tablet every 12 hours as needed #30.  We will continue the opioid monitoring program,this consists of regular clinic visits, examinations, urine drug screen, pill counts as well use of The [place] Controlled Substance Reporting  2. History of cervicalgia: Continue HEP and Continue to Monitor.  3. Chronic pain related to CN 5 involvement : Continue Voltaren Gel   15 minutes of face to face patient care time was spent during this visit. All questions were encouraged and answered.   F/U in 2 months

## 2015-11-30 LAB — TOXASSURE SELECT,+ANTIDEPR,UR

## 2015-12-01 DIAGNOSIS — H25043 Posterior subcapsular polar age-related cataract, bilateral: Secondary | ICD-10-CM | POA: Diagnosis not present

## 2015-12-01 DIAGNOSIS — H2513 Age-related nuclear cataract, bilateral: Secondary | ICD-10-CM | POA: Diagnosis not present

## 2015-12-01 DIAGNOSIS — H40013 Open angle with borderline findings, low risk, bilateral: Secondary | ICD-10-CM | POA: Diagnosis not present

## 2015-12-01 DIAGNOSIS — H04122 Dry eye syndrome of left lacrimal gland: Secondary | ICD-10-CM | POA: Diagnosis not present

## 2015-12-01 DIAGNOSIS — H40023 Open angle with borderline findings, high risk, bilateral: Secondary | ICD-10-CM | POA: Diagnosis not present

## 2015-12-01 DIAGNOSIS — H40053 Ocular hypertension, bilateral: Secondary | ICD-10-CM | POA: Diagnosis not present

## 2015-12-01 DIAGNOSIS — H25013 Cortical age-related cataract, bilateral: Secondary | ICD-10-CM | POA: Diagnosis not present

## 2015-12-01 DIAGNOSIS — H353132 Nonexudative age-related macular degeneration, bilateral, intermediate dry stage: Secondary | ICD-10-CM | POA: Diagnosis not present

## 2015-12-01 NOTE — Progress Notes (Signed)
Urine drug screen for this encounter is consistent for prescribed medication 

## 2016-01-19 ENCOUNTER — Encounter: Payer: Self-pay | Admitting: Physical Medicine & Rehabilitation

## 2016-01-19 ENCOUNTER — Telehealth: Payer: Self-pay | Admitting: *Deleted

## 2016-01-19 ENCOUNTER — Encounter: Payer: Medicare Other | Attending: Physical Medicine & Rehabilitation | Admitting: Physical Medicine & Rehabilitation

## 2016-01-19 DIAGNOSIS — G8929 Other chronic pain: Secondary | ICD-10-CM | POA: Diagnosis not present

## 2016-01-19 DIAGNOSIS — G500 Trigeminal neuralgia: Secondary | ICD-10-CM

## 2016-01-19 DIAGNOSIS — D333 Benign neoplasm of cranial nerves: Secondary | ICD-10-CM

## 2016-01-19 MED ORDER — METHADONE HCL 10 MG PO TABS: 10.0000 mg | ORAL_TABLET | Freq: Every day | ORAL | 0 refills | Status: DC

## 2016-01-19 MED ORDER — TAPENTADOL HCL 50 MG PO TABS: 50.0000 mg | ORAL_TABLET | Freq: Two times a day (BID) | ORAL | 0 refills | Status: DC | PRN

## 2016-01-19 MED ORDER — LIDOCAINE 5 % EX OINT: 1.0000 "application " | TOPICAL_OINTMENT | CUTANEOUS | 3 refills | Status: DC | PRN

## 2016-01-19 NOTE — Telephone Encounter (Signed)
Called patient and left message to return call

## 2016-01-19 NOTE — Patient Instructions (Signed)
ICY HOT PATCH WITH LIDOCAINE???   PLEASE CALL ME WITH ANY PROBLEMS OR QUESTIONS [phone]   HAPPY HOLIDAYS!!!!                    *                * *             *   *   *         *  *   *  *  *     *  *  *  *  *  *  * *  *  *  *  *  *  *  *  *  * *               *  *               *  *               *  *

## 2016-01-19 NOTE — Progress Notes (Signed)
Subjective:    Patient ID: [name], female    DOB: March 12, 1945, 70 y.o.   MRN: [id number]  HPI   [name] is here in follow up of her chronic pain. She has been having a little more pain as of late because of the fact that she can't afford the nucynta. Her "good rx" card is not paying a substantial enough portion of the payment to allow her to purchase. She is interested in other options which might be more affordable. She has a little more voltaren gel left which she is using sparingly.   She is on methadone 10mg  in the AM only.    Pain Inventory Average Pain 7 Pain Right Now 4 My pain is sharp, burning, stabbing and tingling  In the last 24 hours, has pain interfered with the following? General activity 8 Relation with others 9 Enjoyment of life 9 What TIME of day is your pain at its worst? daytime, evening Sleep (in general) Good  Pain is worse with: some activites Pain improves with: rest, heat/ice and medication Relief from Meds: 8  Mobility walk without assistance ability to climb steps?  yes do you drive?  yes transfers alone Do you have any goals in this area?  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  no  Neuro/Psych dizziness loss of taste or smell  Prior Studies Any changes since last visit?  no CT/MRI  Physicians involved in your care Any changes since last visit?  no   Family History  Problem Relation Age of Onset  . Hypertension Mother   . Transient ischemic attack Mother   . Coronary artery disease Father     CHF  . Coronary artery disease Sister   . Macular degeneration Sister   . Transient ischemic attack Maternal Grandmother   . Diabetes Neg Hx   . Cancer Neg Hx    Social History   Social History  . Marital status: Married    Spouse name: N/A  . Number of  children: 1  . Years of education: N/A   Occupational History  . Retired    Social History Main Topics  . Smoking status: Never Smoker  . Smokeless tobacco: Never Used  . Alcohol use No  . Drug use: No  . Sexual activity: Not Asked   Other Topics Concern  . None   Social History Narrative  . None   Past Surgical History:  Procedure Laterality Date  . ABDOMINAL HYSTERECTOMY    . [place]   closed hole in heart  . LUMBAR DISC SURGERY     Dr Eddie Dibbles  . no colonoscopy     "scared"; SOC reviewed  . 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 Abnormality of gait   . Brachial neuritis or radiculitis NOS   . Cervicalgia   . Facial nerve disorder   . Hearing loss    Left ear  . 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 Hyperlipidemia   . Hypertension   . Macular degeneration   . Myofascial pain   . Pneumonia 2005  . Thyroid disease    hyprothyroidism  . Trigeminal neuralgia  BP 139/68 (BP Location: Left Arm, Patient Position: Sitting, Cuff Size: Large)   Pulse 79   Resp 14   SpO2 98%   Opioid Risk Score:   Fall Risk Score:  `1  Depression screen PHQ 2/9  Depression screen [facility] 2/9 05/22/2014 03/07/2014 12/15/2012  Decreased Interest 1 0 0  Down, Depressed, Hopeless 0 0 0  PHQ - 2 Score 1 0 0  Altered sleeping 1 - -  Tired, decreased energy 2 - -  Change in appetite 0 - -  Feeling bad or failure about yourself  0 - -  Trouble concentrating 1 - -  Moving slowly or fidgety/restless 0 - -  Suicidal thoughts 0 - -  PHQ-9 Score 5 - -    Review of Systems  Constitutional: Positive for unexpected weight change.  HENT: Negative.   Eyes: Negative.   Respiratory: Negative.   Cardiovascular: Positive for leg swelling.  Gastrointestinal: Positive for constipation.  Endocrine: Negative.   Genitourinary: Positive for difficulty urinating.  Musculoskeletal: Negative.    Allergic/Immunologic: Negative.   Neurological: Positive for dizziness.  Hematological: Negative.   Psychiatric/Behavioral: Negative.        Objective:   Physical Exam  HENT:  Head: Normocephalic and atraumatic.  Eyes: normal sclera. Non-irritated Cardiovascular: RRR Pulmonary/Chest: cta.  Abdominal: Bowel sounds are normal.  Musculoskeletal: Normal range of motion. No pain with right shoulder IR/ER Neurological: She is alert and oriented to person, place, and time. Occasional drifting to left when she walks, didn't lose balance today . Exam stable Left lid  Almost closes with effort. . she has persistent left facial droop.   Left face remains somewhat hypersenstiive to touch.  . No gross ataxia.    Psychiatric: mood is bright today. In good spirits.    Assessment & Plan:   ASSESSMENT:  1. History of vestibular schwannoma resulting in trigeminal neuralgia and left facial nerve injury- no tumor regrowth per MRI--has chronic peri-operative scarring and atrophy which accounts for weakness, pain, and balance issues. I see no neurological changes on exam today.  2. History of cervicalgia  3. Chronic pain related to CN 5 involvement  4. Hypothyroid on supplementation  5. Right shoulder pain==resolved. Likely mild OA/RTC pathology  6. Reactive depression and anxiety     PLAN:  1. Methadone 10 mg daily, #30 with a second rx for next month. e 2. Nucynta 50 mg 1 p.o. q.12 h. p.r.n. 30 with no refill for next month. Gave her a copay assist card which might help her too if she's paying out of pocket. If she can't pay for this, will consider an rx for oxycodone 10mg  3. Thyroid/hormone supplement per primary.  5. She will continue with the voltaren gel for face. Also gave her an rx for lidocaine ointment 5%. Consider icy hot patch with lidocaine as well.  6. Consider a course of therapy for facial muscle strength/estim in the future. 7. She'll follow up with me in 2 months. 15 minutes  of face to face patient care time were spent during this visit. All questions were encouraged and answered. Encouraged to her to follow through with eye exam.

## 2016-02-02 DIAGNOSIS — H2512 Age-related nuclear cataract, left eye: Secondary | ICD-10-CM | POA: Diagnosis not present

## 2016-02-02 HISTORY — PX: CATARACT EXTRACTION: SUR2

## 2016-02-03 DIAGNOSIS — H2511 Age-related nuclear cataract, right eye: Secondary | ICD-10-CM | POA: Diagnosis not present

## 2016-02-04 ENCOUNTER — Ambulatory Visit (HOSPITAL_BASED_OUTPATIENT_CLINIC_OR_DEPARTMENT_OTHER)
Admission: RE | Admit: 2016-02-04 | Discharge: 2016-02-04 | Disposition: A | Discharge status: Home / Self Care | Payer: Medicare Other | Source: Ambulatory Visit | Attending: Family | Admitting: Family

## 2016-02-04 ENCOUNTER — Ambulatory Visit (INDEPENDENT_AMBULATORY_CARE_PROVIDER_SITE_OTHER): Payer: Medicare Other | Admitting: Family

## 2016-02-04 ENCOUNTER — Encounter: Payer: Self-pay | Admitting: Family

## 2016-02-04 VITALS — BP 142/59 | HR 99 | Temp 98.4°F | Resp 18 | Ht 63.5 in | Wt 165.8 lb

## 2016-02-04 DIAGNOSIS — M79605 Pain in left leg: Secondary | ICD-10-CM

## 2016-02-04 DIAGNOSIS — M7989 Other specified soft tissue disorders: Secondary | ICD-10-CM

## 2016-02-04 NOTE — Progress Notes (Signed)
Subjective:    Patient ID: [name], female    DOB: 1945-09-26, 70 y.o.   MRN: [id number]  HPI  [name] is a 70 yr old female who presents today with chief complaint of left shin pain/swelling.  She reports that she has a hx of lumbar disc disease and did have some pain down the left buttock yesterday, however this pain has resolved.    Denies fever.  Denies recent travel.  Denies CP/SOB.   Review of Systems    see HPI  Past Medical History:  Diagnosis Date  . Abnormality of gait   . Brachial neuritis or radiculitis NOS   . Cervicalgia   . Facial nerve disorder   . Hearing loss    Left ear  . 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 Hyperlipidemia   . Hypertension   . Macular degeneration   . Myofascial pain   . Pneumonia 2005  . Thyroid disease    hyprothyroidism  . Trigeminal neuralgia      Social History   Social History  . Marital status: Married    Spouse name: N/A  . Number of children: 1  . Years of education: N/A   Occupational History  . Retired    Social History Main Topics  . Smoking status: Never Smoker  . Smokeless tobacco: Never Used  . Alcohol use No  . Drug use: No  . Sexual activity: Not on file   Other Topics Concern  . Not on file   Social History Narrative  . No narrative on file    Past Surgical History:  Procedure Laterality Date  . ABDOMINAL HYSTERECTOMY    . [place]   closed hole in heart  . CATARACT EXTRACTION Left 02/02/2016  . LUMBAR DISC SURGERY     Dr [name]  . no colonoscopy     "scared"; SOC reviewed  . 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; 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    Family History  Problem Relation Age of Onset  . Hypertension Mother   . Transient ischemic attack Mother   . Coronary artery disease Father     CHF  . Coronary artery disease Sister   . Macular degeneration Sister   . Transient ischemic attack Maternal Grandmother   . Diabetes Neg Hx     . Cancer Neg Hx     Allergies  Allergen Reactions  . Morphine     REACTION: GI upset    Current Outpatient Prescriptions on File Prior to Visit  Medication Sig Dispense Refill  . BIOTIN 5000 PO Take by mouth.    . diclofenac sodium (VOLTAREN) 1 % GEL Apply 1 application topically 3 (three) times daily. To left face 3 Tube 4  . losartan-hydrochlorothiazide (HYZAAR) 100-12.5 MG tablet TAKE ONE TABLET BY MOUTH ONCE DAILY 90 tablet 1  . methadone (DOLOPHINE) 10 MG tablet Take 1 tablet (10 mg total) by mouth daily. 30 tablet 0  . Multiple Vitamins-Minerals (PRESERVISION AREDS PO) Take by mouth.    . tapentadol (NUCYNTA) 50 MG tablet Take 1 tablet (50 mg total) by mouth every 12 (twelve) hours as needed. 30 tablet 0   No current facility-administered medications on file prior to visit.     BP (!) 142/59 (BP Location: Right Arm, Cuff Size: Normal)   Pulse 99   Temp 98.4 F (36.9 C) (Oral)   Resp 18  Ht 5' 3.5" (1.613 m)   Wt 165 lb 12.8 oz (75.2 kg)   SpO2 99% Comment: room air  BMI 28.91 kg/m    Objective:   Physical Exam  Constitutional: She appears well-developed and well-nourished.  Cardiovascular: Normal rate, regular rhythm and normal heart sounds.   No murmur heard. Pulmonary/Chest: Effort normal and breath sounds normal. No respiratory distress. She has no wheezes.  Musculoskeletal:  Trace RLE edema.  LLE swelling noted left dorsal shin and left dorsal foot.   Psychiatric: She has a normal mood and affect. Her behavior is normal. Judgment and thought content normal.  Skin:  No erythema is noted.         Assessment & Plan:  LLE swelling- need to rule out DVT.  Will obtain LE doppler.  No obvious sign of infection.  Pt is advised as follows:  Please complete ultrasound on the first floor.  Elevated your left leg as able. Call if increased pain, swelling or if you develop redness/fever. Call if not improved in 1 week.

## 2016-02-04 NOTE — Patient Instructions (Addendum)
Please complete ultrasound on the first floor.  Elevated your left leg as able. Call if increased pain, swelling or if you develop redness/fever. Call if not improved in 1 week.

## 2016-02-04 NOTE — Progress Notes (Signed)
Pre visit review using our clinic review tool, if applicable. No additional management support is needed unless otherwise documented below in the visit note. 

## 2016-02-10 DIAGNOSIS — M545 Low back pain: Secondary | ICD-10-CM | POA: Diagnosis not present

## 2016-02-10 DIAGNOSIS — M7062 Trochanteric bursitis, left hip: Secondary | ICD-10-CM | POA: Diagnosis not present

## 2016-02-10 DIAGNOSIS — M5442 Lumbago with sciatica, left side: Secondary | ICD-10-CM | POA: Diagnosis not present

## 2016-02-23 DIAGNOSIS — H2511 Age-related nuclear cataract, right eye: Secondary | ICD-10-CM | POA: Diagnosis not present

## 2016-03-22 ENCOUNTER — Encounter: Payer: Medicare Other | Attending: Physical Medicine & Rehabilitation | Admitting: Physical Medicine & Rehabilitation

## 2016-03-22 ENCOUNTER — Encounter: Payer: Self-pay | Admitting: Physical Medicine & Rehabilitation

## 2016-03-22 VITALS — BP 132/68 | HR 99

## 2016-03-22 DIAGNOSIS — Z5181 Encounter for therapeutic drug level monitoring: Secondary | ICD-10-CM

## 2016-03-22 DIAGNOSIS — G8929 Other chronic pain: Secondary | ICD-10-CM | POA: Diagnosis not present

## 2016-03-22 DIAGNOSIS — D333 Benign neoplasm of cranial nerves: Secondary | ICD-10-CM | POA: Diagnosis not present

## 2016-03-22 DIAGNOSIS — Z79899 Other long term (current) drug therapy: Secondary | ICD-10-CM | POA: Diagnosis not present

## 2016-03-22 DIAGNOSIS — G500 Trigeminal neuralgia: Secondary | ICD-10-CM

## 2016-03-22 MED ORDER — METHADONE HCL 10 MG PO TABS: 10.0000 mg | ORAL_TABLET | Freq: Every day | ORAL | 0 refills | Status: DC

## 2016-03-22 MED ORDER — TAPENTADOL HCL 50 MG PO TABS: 50.0000 mg | ORAL_TABLET | Freq: Two times a day (BID) | ORAL | 0 refills | Status: DC | PRN

## 2016-03-22 NOTE — Progress Notes (Signed)
Subjective:    Patient ID: [name], female    DOB: [birth year], 71 y.o.   MRN: [id number]  HPI  Munni is here in follow up of her vestibular schwannoma. She had both of her cataracts surgeries. The left eye (with weight) has taken longer to recover. Other than that she's happy with the surgery. Her hearing remains an issue but hasn't changed a great deal.   She remains on methadone and nucynta for pain control which are still effective. She uses the nucynta only at night and even then she's not regular with it.   Pain Inventory Average Pain 7 Pain Right Now 4 My pain is sharp, burning, stabbing and tingling  In the last 24 hours, has pain interfered with the following? General activity 7 Relation with others 8 Enjoyment of life 9 What TIME of day is your pain at its worst? . Sleep (in general) Good  Pain is worse with: talking Pain improves with: heat/ice and medication Relief from Meds: 7  Mobility walk without assistance ability to climb steps?  yes do you drive?  yes  Function I need assistance with the following:  household duties  Neuro/Psych weakness trouble walking anxiety  Prior Studies Any changes since last visit?  no  Physicians involved in your care Any changes since last visit?  no   Family History  Problem Relation Age of Onset  . Hypertension Mother   . Transient ischemic attack Mother   . Coronary artery disease Father     CHF  . Coronary artery disease Sister   . Macular degeneration Sister   . Transient ischemic attack Maternal Grandmother   . Diabetes Neg Hx   . Cancer Neg Hx    Social History   Social History  . Marital status: Married    Spouse name: N/A  . Number of children: 1  . Years of education: N/A   Occupational History  . Retired    Social History Main Topics  . Smoking status: Never Smoker  . Smokeless tobacco: Never Used  . Alcohol use No  . Drug use: No  . Sexual activity: Not on file   Other  Topics Concern  . Not on file   Social History Narrative  . No narrative on file   Past Surgical History:  Procedure Laterality Date  . ABDOMINAL HYSTERECTOMY    . [place]   closed hole in heart  . CATARACT EXTRACTION Left 02/02/2016  . LUMBAR DISC SURGERY     Dr Eddie Dibbles  . no colonoscopy     "scared"; SOC reviewed  . 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 Abnormality of gait   . Brachial neuritis or radiculitis NOS   . Cervicalgia   . Facial nerve disorder   . Hearing loss    Left ear  . 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 Hyperlipidemia   . Hypertension   . Macular degeneration   . Myofascial pain   . Pneumonia 2005  . Thyroid disease    hyprothyroidism  . Trigeminal neuralgia    There were no vitals taken for this visit.  Opioid Risk Score:   Fall Risk Score:  `1  Depression screen PHQ 2/9  Depression screen [facility] 2/9 02/04/2016 05/22/2014 03/07/2014 12/15/2012  Decreased Interest 0 1 0 0  Down, Depressed,  Hopeless 0 0 0 0  PHQ - 2 Score 0 1 0 0  Altered sleeping - 1 - -  Tired, decreased energy - 2 - -  Change in appetite - 0 - -  Feeling bad or failure about yourself  - 0 - -  Trouble concentrating - 1 - -  Moving slowly or fidgety/restless - 0 - -  Suicidal thoughts - 0 - -  PHQ-9 Score - 5 - -   Review of Systems  Constitutional: Positive for unexpected weight change.  HENT: Negative.   Eyes: Negative.   Respiratory: Negative.   Cardiovascular: Negative.   Gastrointestinal: Negative.   Endocrine: Negative.   Genitourinary: Positive for difficulty urinating.  Musculoskeletal: Positive for gait problem and joint swelling.  Allergic/Immunologic: Negative.   Neurological: Positive for weakness.  Hematological: Negative.   Psychiatric/Behavioral: The patient is nervous/anxious.   All other systems reviewed and are negative.      Objective:    Physical Exam HENT:  Head: Normocephalic and atraumatic.  Eyes: normal sclera. Non-irritated Cardiovascular: RRR Pulmonary/Chest: CTA   Abdominal: Bowel sounds are normal.  Musculoskeletal: Normal range of motion. No pain with right shoulder IR/ER Neurological: She is alert and oriented to person, place, and time. Occasional drifting to left when she walks, didn't lose balance today . Exam stable Left lid- persistent lag, left facial droop.  Left face is stil hypersenstiive to touch.  No gross ataxia. Balance stable   Psychiatric: mood is bright today. In good spirits.    Assessment & Plan:  ASSESSMENT:  1. History of vestibular schwannoma resulting in trigeminal neuralgia and left facial nerve injury- no tumor regrowth per MRI--has chronic peri-operative scarring and atrophy which accounts for weakness, pain, and balance issues.   -neuro exam stable 2. History of cervicalgia and lumbar spine surgery---recent low back strain? 3. Chronic pain related to CN 5 involvement  4. Hypothyroid on supplementation  5. Right shoulder pain==resolved.   6. Reactive depression and anxiety      PLAN:  1. Methadone 10 mg daily, #30 with a second rx for next month.  2. Nucynta 50 mg 1 p.o. q.12 h. p.r.n. 30--no RF needed today We will continue the opioid monitoring program, this consists of regular clinic visits, examinations, urine drug screen, pill counts as well as use of [place] Controlled Substance Reporting System. 3. Thyroid/hormone supplement per primary.  5. She will continue with the voltaren gel for face. Also gave her an rx for lidocaine ointment 5%. Consider icy hot patch with lidocaine as well.  6. HEP for back as needed 7. She'll follow up with me in 2 months. 15 minutes of face to face patient care time were spent during this visit. All questions were encouraged and answered. Encouraged to her to follow through with eye exam.

## 2016-03-22 NOTE — Addendum Note (Signed)
Addended by: Marland Mcalpine B on: 03/22/2016 11:41 AM   Modules accepted: Orders

## 2016-03-22 NOTE — Patient Instructions (Signed)
PLEASE FEEL FREE TO CALL OUR OFFICE WITH ANY PROBLEMS OR QUESTIONS ([phone])      

## 2016-03-27 LAB — TOXASSURE SELECT,+ANTIDEPR,UR

## 2016-03-29 ENCOUNTER — Telehealth: Payer: Self-pay

## 2016-03-29 NOTE — Telephone Encounter (Signed)
[facility] called requesting an explanation on why this patient is handing over a December dated script for methadone 10mg  today, after reviewing her [place] it is noticed that this patient has been skipping months on when filling this medication even thou she gets monthly rx's, [place] printed on placed on your desk for review. And please advise.

## 2016-03-29 NOTE — Telephone Encounter (Signed)
Consulted with doctor, states to inform her to attempt to be more consistent with the prescriptions IE since its February to use [facility] prescriptions and discard unused ones.

## 2016-03-31 NOTE — Telephone Encounter (Signed)
Discussed with Khaya

## 2016-04-08 ENCOUNTER — Telehealth: Payer: Self-pay | Admitting: Family

## 2016-04-08 NOTE — Telephone Encounter (Signed)
lvm advising patient to scheduled AWV.

## 2016-04-09 NOTE — Progress Notes (Signed)
Urine drug screen for this encounter is consistent for prescribed medication 

## 2016-04-12 DIAGNOSIS — H16143 Punctate keratitis, bilateral: Secondary | ICD-10-CM | POA: Diagnosis not present

## 2016-04-12 DIAGNOSIS — H18413 Arcus senilis, bilateral: Secondary | ICD-10-CM | POA: Diagnosis not present

## 2016-04-12 DIAGNOSIS — S0500XD Injury of conjunctiva and corneal abrasion without foreign body, unspecified eye, subsequent encounter: Secondary | ICD-10-CM | POA: Diagnosis not present

## 2016-04-12 DIAGNOSIS — Z961 Presence of intraocular lens: Secondary | ICD-10-CM | POA: Diagnosis not present

## 2016-04-12 DIAGNOSIS — H04123 Dry eye syndrome of bilateral lacrimal glands: Secondary | ICD-10-CM | POA: Diagnosis not present

## 2016-04-12 DIAGNOSIS — H40023 Open angle with borderline findings, high risk, bilateral: Secondary | ICD-10-CM | POA: Diagnosis not present

## 2016-04-12 DIAGNOSIS — H16223 Keratoconjunctivitis sicca, not specified as Sjogren's, bilateral: Secondary | ICD-10-CM | POA: Diagnosis not present

## 2016-05-19 ENCOUNTER — Encounter: Payer: Medicare Other | Admitting: Physical Medicine & Rehabilitation

## 2016-05-31 ENCOUNTER — Encounter (INDEPENDENT_AMBULATORY_CARE_PROVIDER_SITE_OTHER): Payer: Medicare Other | Admitting: Ophthalmology

## 2016-06-07 ENCOUNTER — Encounter (INDEPENDENT_AMBULATORY_CARE_PROVIDER_SITE_OTHER): Payer: Medicare Other | Admitting: Ophthalmology

## 2016-06-07 DIAGNOSIS — H353132 Nonexudative age-related macular degeneration, bilateral, intermediate dry stage: Secondary | ICD-10-CM | POA: Diagnosis not present

## 2016-06-07 DIAGNOSIS — H35033 Hypertensive retinopathy, bilateral: Secondary | ICD-10-CM | POA: Diagnosis not present

## 2016-06-07 DIAGNOSIS — H43813 Vitreous degeneration, bilateral: Secondary | ICD-10-CM | POA: Diagnosis not present

## 2016-06-07 DIAGNOSIS — I10 Essential (primary) hypertension: Secondary | ICD-10-CM

## 2016-06-28 ENCOUNTER — Encounter: Payer: Self-pay | Admitting: Physical Medicine & Rehabilitation

## 2016-06-28 ENCOUNTER — Encounter: Payer: Medicare Other | Attending: Physical Medicine & Rehabilitation | Admitting: Physical Medicine & Rehabilitation

## 2016-06-28 VITALS — BP 142/73 | HR 78

## 2016-06-28 DIAGNOSIS — Z79899 Other long term (current) drug therapy: Secondary | ICD-10-CM | POA: Diagnosis not present

## 2016-06-28 DIAGNOSIS — G500 Trigeminal neuralgia: Secondary | ICD-10-CM

## 2016-06-28 DIAGNOSIS — D333 Benign neoplasm of cranial nerves: Secondary | ICD-10-CM | POA: Diagnosis not present

## 2016-06-28 DIAGNOSIS — G8929 Other chronic pain: Secondary | ICD-10-CM | POA: Insufficient documentation

## 2016-06-28 DIAGNOSIS — Z5181 Encounter for therapeutic drug level monitoring: Secondary | ICD-10-CM

## 2016-06-28 DIAGNOSIS — G894 Chronic pain syndrome: Secondary | ICD-10-CM

## 2016-06-28 MED ORDER — METHADONE HCL 10 MG PO TABS: 10.0000 mg | ORAL_TABLET | Freq: Every day | ORAL | 0 refills | Status: DC

## 2016-06-28 MED ORDER — TAPENTADOL HCL 50 MG PO TABS: 50.0000 mg | ORAL_TABLET | Freq: Two times a day (BID) | ORAL | 0 refills | Status: DC | PRN

## 2016-06-28 NOTE — Progress Notes (Signed)
Subjective:    Patient ID: [name], female    DOB: Aug 13, 1945, 71 y.o.   MRN: [id number]  HPI   [name] is here in follow up of her chronic pain and vestibular schwannoma. She had her eye surgery over the winter. Her vision is still blurry in the left eye. Her macular degeneration has progressed somewhat but not to the point where it should be impacted her vision to that degree. She also feels that her balance has been more affected and she's noticed that more vertigo and nausea. She's needed some physical assistance on occasion. She has to use meclizine sometimes to manage the symptoms.   She is working more at the hotel again. She is up to 24 hours per week. She takes no break.   Her bowels and bladder are functional        Pain Inventory Average Pain 7 Pain Right Now 7 My pain is burning, stabbing and tingling  In the last 24 hours, has pain interfered with the following? General activity 7 Relation with others 9 Enjoyment of life 10 What TIME of day is your pain at its worst? daytime Sleep (in general) Fair  Pain is worse with: talking Pain improves with: rest, heat/ice and medication Relief from Meds: 6  Mobility walk without assistance  Function employed # of hrs/week 20  Neuro/Psych weakness trouble walking anxiety loss of taste or smell  Prior Studies Any changes since last visit?  no  Physicians involved in your care Any changes since last visit?  no   Family History  Problem Relation Age of Onset  . Hypertension Mother   . Transient ischemic attack Mother   . Coronary artery disease Father        CHF  . Coronary artery disease Sister   . Macular degeneration Sister   . Transient ischemic attack Maternal Grandmother   . Diabetes Neg Hx   . Cancer Neg Hx    Social History   Social History  . Marital status: Married    Spouse name: N/A  . Number of children: 1  . Years of education: N/A   Occupational History  . Retired     Social History Main Topics  . Smoking status: Never Smoker  . Smokeless tobacco: Never Used  . Alcohol use No  . Drug use: No  . Sexual activity: Not on file   Other Topics Concern  . Not on file   Social History Narrative  . No narrative on file   Past Surgical History:  Procedure Laterality Date  . ABDOMINAL HYSTERECTOMY    . [place]   closed hole in heart  . CATARACT EXTRACTION Left 02/02/2016  . LUMBAR DISC SURGERY     Dr Eddie Dibbles  . no colonoscopy     "scared"; SOC reviewed  . 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 Abnormality of gait   . Brachial neuritis or radiculitis NOS   . Cervicalgia   . Facial nerve disorder   . Hearing loss    Left ear  . 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 Hyperlipidemia   . Hypertension   . Macular degeneration   . Myofascial pain   . Pneumonia 2005  . Thyroid disease    hyprothyroidism  . Trigeminal neuralgia    There were no vitals  taken for this visit.  Opioid Risk Score:   Fall Risk Score:  `1  Depression screen PHQ 2/9  Depression screen [facility] 2/9 02/04/2016 05/22/2014 03/07/2014 12/15/2012  Decreased Interest 0 1 0 0  Down, Depressed, Hopeless 0 0 0 0  PHQ - 2 Score 0 1 0 0  Altered sleeping - 1 - -  Tired, decreased energy - 2 - -  Change in appetite - 0 - -  Feeling bad or failure about yourself  - 0 - -  Trouble concentrating - 1 - -  Moving slowly or fidgety/restless - 0 - -  Suicidal thoughts - 0 - -  PHQ-9 Score - 5 - -    Review of Systems  Constitutional: Negative.   HENT: Negative.   Eyes: Negative.   Respiratory: Negative.   Cardiovascular: Negative.   Gastrointestinal: Positive for constipation and nausea.  Endocrine: Negative.   Genitourinary: Positive for difficulty urinating.  Musculoskeletal: Positive for joint swelling.  Skin: Negative.   Allergic/Immunologic: Negative.    Neurological: Negative.   Hematological: Negative.   Psychiatric/Behavioral: Negative.   All other systems reviewed and are negative.      Objective:   Physical Exam  HENT:  Head: Normocephalic and atraumatic.  Eyes: normal sclera. Non-irritated Cardiovascular: RRR Pulmonary/Chest: CTA   Abdominal: Bowel sounds are normal.  Musculoskeletal: Normal range of motion. No pain with right shoulder IR/ER Neurological: She is alert and oriented to person, place, and time. Occasional drifting to left when she walks, didn't lose balance today . Exam stable Left lid- persistent lag, left facial droop a little more prominent, atrophy left facial muscles. Left face remains  hypersenstiive to touch.  romberg +. No frank nystagmus seen but becomes dizzy during confrontation.  Psychiatric: mood is bright today. In good spirits.    Assessment & Plan:  ASSESSMENT:  1. History of vestibular schwannoma resulting in trigeminal neuralgia and left facial nerve injury- no tumor regrowth per MRI--has chronic peri-operative scarring and atrophy which accounts for weakness, pain, and balance issues.              -neuro exam stable 2. History of cervicalgia and lumbar spine surgery---recent low back strain? 3. Chronic pain related to CN 5 involvement  4. Hypothyroid on supplementation  5. Right shoulder pain==resolved.   6. Reactive depression and anxiety      PLAN:  1. Methadone 10 mg daily, #30 with a second rx for next month. Second rf for next month 2. Nucynta 50 mg 1 p.o. q.12 h. p.r.n. 30--RF today We will continue the opioid monitoring program, this consists of regular clinic visits, examinations, urine drug screen, pill counts as well as use of [place] Controlled Substance Reporting System. [place] was reviewed today.  UDS 3. Thyroid/hormone supplement per primary.  5. She will continue with the voltaren gel for face. Also gave her an rx for lidocaine ointment 5%. Consider icy  hot patch with lidocaine as well.  6. Given increase in vestibular symptom will go ahead and order another MRI of her brain to rule out any progression of disease. The last scan from 2015 displayed scarring vs residual tumor. Eye care per optho 7. If no problems, she'll follow up with me in 52months. 83minutes of face to face patient care time were spent during this visit. All questions were encouraged and answered. Encouraged to her to follow through with eye exam.

## 2016-06-28 NOTE — Patient Instructions (Signed)
PLEASE FEEL FREE TO CALL OUR OFFICE WITH ANY PROBLEMS OR QUESTIONS ([phone])      

## 2016-07-02 ENCOUNTER — Telehealth: Payer: Self-pay | Admitting: *Deleted

## 2016-07-02 LAB — TOXASSURE SELECT,+ANTIDEPR,UR

## 2016-07-02 NOTE — Telephone Encounter (Signed)
Urine drug screen for this encounter is consistent for prescribed medication. There is a small amount of oxazepam that cannot be accounted for. I have put a call out to Minie to find out if she is taking anything that could result in oxazepam. By [facility] there is not.  I called and spoke with toxicologist at [facility]. He first said there were no contaminants in Methodone or Tapentadol that would yield oxazepam.  But after reviewing all of [name]'s urine test there has almost always been small amount of oxazepam present in her tests and she has always been on Methadone and Tapentadol dating back to 2013 in our computer system. These were also from two different laboratories.  In researching prescriptions history back through 2013, she has occasionally been prescribed Tranzene by a primary care and that would account for the metabolite oxazepam in her urine if she had taken it for sleep.

## 2016-07-09 ENCOUNTER — Telehealth: Payer: Self-pay | Admitting: *Deleted

## 2016-07-09 NOTE — Telephone Encounter (Signed)
I spoke with [name] and she has a prescription for Tranzene 7.5 mg.  This will be added to her medication list so that the oxazepam metabolite will not be in question in the future.

## 2016-07-10 ENCOUNTER — Ambulatory Visit
Admission: RE | Admit: 2016-07-10 | Discharge: 2016-07-10 | Disposition: A | Discharge status: Home / Self Care | Payer: Medicare Other | Source: Ambulatory Visit | Attending: Physical Medicine & Rehabilitation | Admitting: Physical Medicine & Rehabilitation

## 2016-07-10 DIAGNOSIS — R42 Dizziness and giddiness: Secondary | ICD-10-CM | POA: Diagnosis not present

## 2016-07-10 DIAGNOSIS — D333 Benign neoplasm of cranial nerves: Secondary | ICD-10-CM

## 2016-07-10 DIAGNOSIS — G500 Trigeminal neuralgia: Secondary | ICD-10-CM

## 2016-07-10 MED ORDER — GADOBENATE DIMEGLUMINE 529 MG/ML IV SOLN
15.0000 mL | Freq: Once | INTRAVENOUS | Status: AC | PRN
  Administered 2016-07-10: 14 mL via INTRAVENOUS

## 2016-07-11 ENCOUNTER — Telehealth: Payer: Self-pay | Admitting: Physical Medicine & Rehabilitation

## 2016-08-03 DIAGNOSIS — G894 Chronic pain syndrome: Secondary | ICD-10-CM | POA: Diagnosis not present

## 2016-08-03 DIAGNOSIS — I10 Essential (primary) hypertension: Secondary | ICD-10-CM | POA: Diagnosis not present

## 2016-08-03 DIAGNOSIS — E039 Hypothyroidism, unspecified: Secondary | ICD-10-CM | POA: Diagnosis not present

## 2016-08-03 DIAGNOSIS — E559 Vitamin D deficiency, unspecified: Secondary | ICD-10-CM | POA: Diagnosis not present

## 2016-08-03 DIAGNOSIS — Z79899 Other long term (current) drug therapy: Secondary | ICD-10-CM | POA: Diagnosis not present

## 2016-08-25 ENCOUNTER — Encounter: Payer: Self-pay | Admitting: Physical Medicine & Rehabilitation

## 2016-08-25 ENCOUNTER — Encounter: Payer: Medicare Other | Attending: Physical Medicine & Rehabilitation | Admitting: Physical Medicine & Rehabilitation

## 2016-08-25 VITALS — BP 130/67 | HR 77

## 2016-08-25 DIAGNOSIS — G894 Chronic pain syndrome: Secondary | ICD-10-CM | POA: Diagnosis not present

## 2016-08-25 DIAGNOSIS — G500 Trigeminal neuralgia: Secondary | ICD-10-CM

## 2016-08-25 DIAGNOSIS — G8929 Other chronic pain: Secondary | ICD-10-CM | POA: Insufficient documentation

## 2016-08-25 DIAGNOSIS — Z5181 Encounter for therapeutic drug level monitoring: Secondary | ICD-10-CM

## 2016-08-25 DIAGNOSIS — Z79899 Other long term (current) drug therapy: Secondary | ICD-10-CM | POA: Diagnosis not present

## 2016-08-25 DIAGNOSIS — D333 Benign neoplasm of cranial nerves: Secondary | ICD-10-CM | POA: Diagnosis not present

## 2016-08-25 MED ORDER — METHADONE HCL 10 MG PO TABS: 10.0000 mg | ORAL_TABLET | Freq: Every day | ORAL | 0 refills | Status: DC

## 2016-08-25 MED ORDER — TAPENTADOL HCL 50 MG PO TABS: 50.0000 mg | ORAL_TABLET | Freq: Two times a day (BID) | ORAL | 0 refills | Status: DC | PRN

## 2016-08-25 NOTE — Progress Notes (Signed)
Subjective:    Patient ID: [name], female    DOB: [birth year], 71 y.o.   MRN: [id number]  HPI   [name] is here in follow up of her chronic pain. I ordered a repeat MRI given her increase in vestibular symptoms. The MRI showed chronic atrophy and scarring with no change in tumor size.   Her hearing continues to be an issue with worse hearing noted on the right side despite a hearing aide  She remains on methadone for pain control. She uses nucynta rarely     Pain Inventory Average Pain 7 Pain Right Now 5 My pain is sharp, burning and tingling  In the last 24 hours, has pain interfered with the following? General activity 7 Relation with others 9 Enjoyment of life 9 What TIME of day is your pain at its worst? daytime Sleep (in general) Good  Pain is worse with: inactivity and some activites Pain improves with: heat/ice Relief from Meds: 9  Mobility walk without assistance ability to climb steps?  yes do you drive?  yes  Function employed # of hrs/week 25 retired  Neuro/Psych weakness numbness dizziness anxiety loss of taste or smell  Prior Studies Any changes since last visit?  no  Physicians involved in your care Any changes since last visit?  no   Family History  Problem Relation Age of Onset  . Hypertension Mother   . Transient ischemic attack Mother   . Coronary artery disease Father        CHF  . Coronary artery disease Sister   . Macular degeneration Sister   . Transient ischemic attack Maternal Grandmother   . Diabetes Neg Hx   . Cancer Neg Hx    Social History   Social History  . Marital status: Married    Spouse name: N/A  . Number of children: 1  . Years of education: N/A   Occupational History  . Retired    Social History Main Topics  . Smoking status: Never Smoker  . Smokeless tobacco: Never Used  . Alcohol use No  . Drug use: No  . Sexual activity: Not on file   Other Topics Concern  . Not on file   Social  History Narrative  . No narrative on file   Past Surgical History:  Procedure Laterality Date  . ABDOMINAL HYSTERECTOMY    . [place]   closed hole in heart  . CATARACT EXTRACTION Left 02/02/2016  . LUMBAR DISC SURGERY     Dr Eddie Dibbles  . no colonoscopy     "scared"; SOC reviewed  . 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 Abnormality of gait   . Brachial neuritis or radiculitis NOS   . Cervicalgia   . Facial nerve disorder   . Hearing loss    Left ear  . 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 Hyperlipidemia   . Hypertension   . Macular degeneration   . Myofascial pain   . Pneumonia 2005  . Thyroid disease    hyprothyroidism  . Trigeminal neuralgia    There were no vitals taken for this visit.  Opioid Risk Score:   Fall Risk Score:  `1  Depression screen PHQ 2/9  Depression screen [facility] 2/9 02/04/2016 05/22/2014 03/07/2014 12/15/2012  Decreased Interest 0 1 0 0  Down, Depressed, Hopeless  0 0 0 0  PHQ - 2 Score 0 1 0 0  Altered sleeping - 1 - -  Tired, decreased energy - 2 - -  Change in appetite - 0 - -  Feeling bad or failure about yourself  - 0 - -  Trouble concentrating - 1 - -  Moving slowly or fidgety/restless - 0 - -  Suicidal thoughts - 0 - -  PHQ-9 Score - 5 - -     Review of Systems  Constitutional: Positive for unexpected weight change.  Gastrointestinal: Positive for constipation.  Genitourinary: Positive for difficulty urinating.  Musculoskeletal: Positive for joint swelling.  All other systems reviewed and are negative.      Objective:   Physical Exam  HENT:  Head: Normocephalic and atraumatic.  Eyes: normal sclera. Non-irritated Cardiovascular: RRR Pulmonary/Chest: CTA  Abdominal: Bowel sounds are normal.  Musculoskeletal: Normal range of motion. No pain with right shoulder IR/ER Neurological: She is alert and oriented to person,  place, and time. Occasional drifting to left when she walks, didn't lose balance today . Exam stable Left lid- persistent lag, left facial droop a little more prominent, atrophy left facial muscles. Left face remains hypersenstiive to touch. romberg +. No frank nystagmus seen but becomes dizzy during confrontation.  Psychiatric: mood is bright today. In good spirits.    Assessment & Plan:  ASSESSMENT:  1. History of vestibular schwannoma resulting in trigeminal neuralgia and left facial nerve injury- no tumor regrowth per MRI--has chronic peri-operative scarring and atrophy which accounts for weakness, pain, and balance issues.  -neuro exam stable  -recent MRI stable  -worsening RIGHT hearing loss 2. History of cervicalgia and lumbar spine surgery---recent low back strain? 3. Chronic pain related to CN 5 involvement  4. Hypothyroid on supplementation  5. Right shoulder pain==resolved.  6. Reactive depression and anxiety     PLAN:  1. Methadone 10 mg daily, #30 with a second rx for next month. Second rf for next month 2. Nucynta 50 mg 1 p.o. q.12 h. p.r.n. 30--RF today.  We will continue the opioid monitoring program, this consists of regular clinic visits, examinations, urine drug screen, pill counts as well as use of [place] Controlled Substance Reporting System. [place] was reviewed today.   3. Thyroid/hormone supplement per primary.  5. She will continue with the voltaren and lidocaine ointment 5% for facial pain. Consider icy hot patch with lidocaine as well.  6. Reviewed MRI of brain from May at [facility]. No change in tumor. She will see ENT regarding her hearing.  7. If no problems, she'll follow up with me in 55months. 70minutes of face to face patient care time were spent during this visit including review of her MRI. All questions were encouraged and answered.

## 2016-08-25 NOTE — Patient Instructions (Signed)
PLEASE FEEL FREE TO CALL OUR OFFICE WITH ANY PROBLEMS OR QUESTIONS ([phone])      

## 2016-09-02 LAB — DRUG TOX MONITOR 1 W/CONF, ORAL FLD
AMPHETAMINES: NEGATIVE ng/mL (ref <10)
Alprazolam: NEGATIVE ng/mL (ref <0.50)
BARBITURATES: NEGATIVE ng/mL (ref <10)
Benzodiazepines: POSITIVE ng/mL — AB (ref <0.50)
Buprenorphine: NEGATIVE ng/mL (ref <0.025)
COCAINE: NEGATIVE ng/mL (ref <2.5)
Chlordiazepoxide: NEGATIVE ng/mL (ref <0.50)
Clonazepam: NEGATIVE ng/mL (ref <0.50)
Diazepam: NEGATIVE ng/mL (ref <0.50)
EDDP: NEGATIVE ng/mL (ref <5.0)
FLURAZEPAM: NEGATIVE ng/mL (ref <0.50)
Fentanyl: NEGATIVE ng/mL (ref <0.10)
Flunitrazepam: NEGATIVE ng/mL (ref <0.50)
Heroin Metabolite: NEGATIVE ng/mL (ref <1.0)
LORAZEPAM: NEGATIVE ng/mL (ref <0.50)
MDMA: NEGATIVE ng/mL (ref <10)
METHADONE: 305.7 ng/mL — AB (ref <5.0)
METHADONE: POSITIVE ng/mL — AB (ref <5.0)
MIDAZOLAM: NEGATIVE ng/mL (ref <0.50)
Meperidine: NEGATIVE ng/mL (ref <5.0)
Meprobamate: NEGATIVE ng/mL (ref <2.5)
NICOTINE METABOLITE: NEGATIVE ng/mL (ref <5.0)
NORDIAZEPAM: 2.96 ng/mL — AB (ref <0.50)
Opiates: NEGATIVE ng/mL (ref <2.5)
Oxazepam: NEGATIVE ng/mL (ref <0.50)
Phencyclidine: NEGATIVE ng/mL (ref <10)
Propoxyphene: NEGATIVE ng/mL (ref <5.0)
TAPENTADOL: NEGATIVE ng/mL (ref <5.0)
THC: NEGATIVE ng/mL (ref <2.5)
TRAMADOL: NEGATIVE ng/mL (ref <5.0)
Temazepam: NEGATIVE ng/mL (ref <0.50)
Triazolam: NEGATIVE ng/mL (ref <0.50)
ZOLPIDEM: NEGATIVE ng/mL (ref <5.0)

## 2016-09-02 LAB — DRUG TOX METHYLPHEN W/CONF,ORAL FLD: Methylphenidate: NEGATIVE ng/mL (ref <1.0)

## 2016-09-02 LAB — DRUG TOX ALC METAB W/CON, ORAL FLD: ALCOHOL METABOLITE: NEGATIVE ng/mL (ref <25)

## 2016-09-06 ENCOUNTER — Telehealth: Payer: Self-pay | Admitting: *Deleted

## 2016-09-06 NOTE — Telephone Encounter (Signed)
Oral swab drug screen was consistent for prescribed medications. Had not filled her Nucynta Rx due to cost.

## 2016-09-28 ENCOUNTER — Telehealth: Payer: Self-pay | Admitting: *Deleted

## 2016-09-28 NOTE — Telephone Encounter (Signed)
[facility] in [place] called to verify Nereida's Methadone Rx.  I called and verified.

## 2016-10-27 ENCOUNTER — Encounter: Payer: Self-pay | Admitting: Physical Medicine & Rehabilitation

## 2016-10-27 ENCOUNTER — Encounter: Payer: Medicare Other | Attending: Physical Medicine & Rehabilitation | Admitting: Physical Medicine & Rehabilitation

## 2016-10-27 VITALS — BP 135/80 | HR 73

## 2016-10-27 DIAGNOSIS — Z79899 Other long term (current) drug therapy: Secondary | ICD-10-CM

## 2016-10-27 DIAGNOSIS — G8929 Other chronic pain: Secondary | ICD-10-CM | POA: Diagnosis not present

## 2016-10-27 DIAGNOSIS — Z5181 Encounter for therapeutic drug level monitoring: Secondary | ICD-10-CM | POA: Diagnosis not present

## 2016-10-27 DIAGNOSIS — S0452XS Injury of facial nerve, left side, sequela: Secondary | ICD-10-CM

## 2016-10-27 DIAGNOSIS — G500 Trigeminal neuralgia: Secondary | ICD-10-CM

## 2016-10-27 DIAGNOSIS — D333 Benign neoplasm of cranial nerves: Secondary | ICD-10-CM | POA: Diagnosis not present

## 2016-10-27 DIAGNOSIS — G894 Chronic pain syndrome: Secondary | ICD-10-CM

## 2016-10-27 MED ORDER — DICLOFENAC SODIUM 1 % TD GEL: 1.0000 "application " | Freq: Three times a day (TID) | TRANSDERMAL | 4 refills | Status: AC

## 2016-10-27 MED ORDER — METHADONE HCL 10 MG PO TABS: 10.0000 mg | ORAL_TABLET | Freq: Every day | ORAL | 0 refills | Status: DC

## 2016-10-27 NOTE — Progress Notes (Signed)
Subjective:    Patient ID: [name], female    DOB: 1945/10/14, 71 y.o.   MRN: [id number]  HPI  [name] is here in follow up of her vestibular schwannoma. She states that things are fairly steady. She still is working sometimes 30 hrs per week.   Her pain levels are fairly stable. Her medications are effective. She notes that when her anxiety and fatigue are more severe, her pain levels also increase. She finds that working helps keep her focus off of her pain.      Pain Inventory Average Pain 6 Pain Right Now 5 My pain is burning, stabbing and tingling  In the last 24 hours, has pain interfered with the following? General activity 7 Relation with others 9 Enjoyment of life 9 What TIME of day is your pain at its worst? daytime Sleep (in general) Fair  Pain is worse with: talking Pain improves with: rest, heat/ice and medication Relief from Meds: 8  Mobility walk without assistance ability to climb steps?  yes do you drive?  yes  Function retired  Neuro/Psych numbness tingling dizziness anxiety loss of taste or smell  Prior Studies Any changes since last visit?  no  Physicians involved in your care Any changes since last visit?  no   Family History  Problem Relation Age of Onset  . Hypertension Mother   . Transient ischemic attack Mother   . Coronary artery disease Father        CHF  . Coronary artery disease Sister   . Macular degeneration Sister   . Transient ischemic attack Maternal Grandmother   . Diabetes Neg Hx   . Cancer Neg Hx    Social History   Social History  . Marital status: Married    Spouse name: N/A  . Number of children: 1  . Years of education: N/A   Occupational History  . Retired    Social History Main Topics  . Smoking status: Never Smoker  . Smokeless tobacco: Never Used  . Alcohol use No  . Drug use: No  . Sexual activity: Not Asked   Other Topics Concern  . None   Social History Narrative  . None    Past Surgical History:  Procedure Laterality Date  . ABDOMINAL HYSTERECTOMY    . [place]   closed hole in heart  . CATARACT EXTRACTION Left 02/02/2016  . LUMBAR DISC SURGERY     Dr Eddie Dibbles  . no colonoscopy     "scared"; SOC reviewed  . 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 Abnormality of gait   . Brachial neuritis or radiculitis NOS   . Cervicalgia   . Facial nerve disorder   . Hearing loss    Left ear  . 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 Hyperlipidemia   . Hypertension   . Macular degeneration   . Myofascial pain   . Pneumonia 2005  . Thyroid disease    hyprothyroidism  . Trigeminal neuralgia    There were no vitals taken for this visit.  Opioid Risk Score:   Fall Risk Score:  `1  Depression screen PHQ 2/9  Depression screen [facility] 2/9 02/04/2016 05/22/2014 03/07/2014 12/15/2012  Decreased Interest 0 1 0 0  Down, Depressed, Hopeless 0 0 0 0  PHQ - 2 Score 0 1 0  0  Altered sleeping - 1 - -  Tired, decreased energy - 2 - -  Change in appetite - 0 - -  Feeling bad or failure about yourself  - 0 - -  Trouble concentrating - 1 - -  Moving slowly or fidgety/restless - 0 - -  Suicidal thoughts - 0 - -  PHQ-9 Score - 5 - -     Review of Systems  Constitutional: Positive for unexpected weight change.  HENT: Negative.   Eyes: Negative.   Respiratory: Negative.   Cardiovascular: Negative.   Gastrointestinal: Positive for constipation and nausea.  Endocrine: Negative.   Genitourinary: Positive for difficulty urinating.  Musculoskeletal: Positive for joint swelling.  Skin: Negative.   Allergic/Immunologic: Negative.   Neurological: Negative.   Hematological: Negative.   Psychiatric/Behavioral: Negative.   All other systems reviewed and are negative.      Objective:   Physical Exam  HENT:  Head: Normocephalic and atraumatic.  Eyes: normal  sclera. Non-irritated Cardiovascular: RRR Pulmonary/Chest: CTA B  Abdominal: Bowel sounds are normal.  Musculoskeletal: Normal range of motion. No pain with right shoulder IR/ER Neurological: She is alert and oriented to person, place, and time. Leans to left somewhat during gait.  Left lid- persistent lag, left facial droop a little more prominent, atrophy left facial muscles. Left face remains hypersenstiive to touch. romberg +. No frank nystagmus seen but becomes dizzy during confrontation.  Psychiatric: bright and appropriate.    Assessment & Plan:  ASSESSMENT:  1. History of vestibular schwannoma resulting in trigeminal neuralgia and left facial nerve injury- no tumor regrowth per MRI--has chronic peri-operative scarring and atrophy which accounts for weakness, pain, and balance issues.  -neuro exam remains stable 2. History of cervicalgia and lumbar spine surgery---recent low back strain? 3. Chronic pain related to CN 5 involvement  4. Hypothyroid on supplementation  5. Right shoulder pain==resolved.  6. Reactive depression and anxiety     PLAN:  1. Methadone 10 mg daily, #30 with a second rx for next month. Second rf for next month 2. Nucynta 50 mg 1 p.o. q.12 h. p.r.n. 30- no refills left today We will continue the opioid monitoring program, this consists of regular clinic visits, examinations, urine drug screen, pill counts as well as use of [place] Controlled Substance Reporting System. [place] today.   3. Thyroid/hormone supplement per primary.  5. She will continue with the voltaren gel for face.  Refill rx provided today.  Consider icy hot patch with lidocaine as well.  6. Eye mgt per optho. SHe will follow up with ENT 7. If no problems, she'll follow up with me in 93months. 15 minutes of face to face patient care time were spent during this visit. All questions were encouraged and answered. Encouraged to her to follow through  with eye exam. Greater than 50% of time during this encounter was spent counseling patient/family in regard to pain mgt strategies, medications, etc.

## 2016-10-27 NOTE — Patient Instructions (Signed)
PLEASE FEEL FREE TO CALL OUR OFFICE WITH ANY PROBLEMS OR QUESTIONS ([phone])      

## 2016-12-29 ENCOUNTER — Encounter: Payer: Self-pay | Admitting: Physical Medicine & Rehabilitation

## 2016-12-29 ENCOUNTER — Encounter: Payer: Medicare Other | Attending: Physical Medicine & Rehabilitation | Admitting: Physical Medicine & Rehabilitation

## 2016-12-29 DIAGNOSIS — G500 Trigeminal neuralgia: Secondary | ICD-10-CM

## 2016-12-29 DIAGNOSIS — D333 Benign neoplasm of cranial nerves: Secondary | ICD-10-CM | POA: Insufficient documentation

## 2016-12-29 DIAGNOSIS — G8929 Other chronic pain: Secondary | ICD-10-CM | POA: Diagnosis not present

## 2016-12-29 MED ORDER — METHADONE HCL 10 MG PO TABS: 10.0000 mg | ORAL_TABLET | Freq: Every day | ORAL | 0 refills | Status: DC

## 2016-12-29 MED ORDER — TAPENTADOL HCL 50 MG PO TABS: 50.0000 mg | ORAL_TABLET | Freq: Two times a day (BID) | ORAL | 0 refills | Status: DC | PRN

## 2016-12-29 NOTE — Progress Notes (Signed)
Subjective:    Patient ID: [name], female    DOB: 1946-01-01, 71 y.o.   MRN: [id number]  HPI   [name] is here in follow up of her chronic pain. She states her pain levels are about the same. She is still struggling with her eyes, but is followed by optho and has received new lenses.   She continues to work 25 hours per week, and she says it fatigues her quite a bit. She likes the work but the fatigue is getting to her. She plans on quitting this coming year.    She remains on methadone for pain control.  This is effective.  She uses Nucynta sparingly only when her facial pain is severe.  She usually takes this in the evening.  [name] does find that when she works her pain levels are better.  The same goes for when she is distracted and busy.  The fatigue however is the biggest problem as mentioned above when she works.  Pain Inventory Average Pain 7 Pain Right Now . My pain is constant, sharp, burning and tingling  In the last 24 hours, has pain interfered with the following? General activity 7 Relation with others 9 Enjoyment of life 10 What TIME of day is your pain at its worst? daytime and evening Sleep (in general) Good  Pain is worse with: talking Pain improves with: rest, heat/ice and medication Relief from Meds: 8  Mobility walk without assistance ability to climb steps?  yes do you drive?  yes transfers alone Do you have any goals in this area?  no  Function employed # of hrs/week 25 I need assistance with the following:  household duties and shopping  Neuro/Psych weakness numbness loss of taste or smell  Prior Studies Any changes since last visit?  no  Physicians involved in your care Any changes since last visit?  no   Family History  Problem Relation Age of Onset  . Hypertension Mother   . Transient ischemic attack Mother   . Coronary artery disease Father        CHF  . Coronary artery disease Sister   . Macular degeneration Sister     . Transient ischemic attack Maternal Grandmother   . Diabetes Neg Hx   . Cancer Neg Hx    Social History   Socioeconomic History  . Marital status: Married    Spouse name: None  . Number of children: 1  . Years of education: None  . Highest education level: None  Social Needs  . Financial resource strain: None  . Food insecurity - worry: None  . Food insecurity - inability: None  . Transportation needs - medical: None  . Transportation needs - non-medical: None  Occupational History  . Occupation: Retired  Tobacco Use  . Smoking status: Never Smoker  . Smokeless tobacco: Never Used  Substance and Sexual Activity  . Alcohol use: No    Alcohol/week: 0.0 oz  . Drug use: No  . Sexual activity: None  Other Topics Concern  . None  Social History Narrative  . None   Past Surgical History:  Procedure Laterality Date  . ABDOMINAL HYSTERECTOMY    . [place]   closed hole in heart  . CATARACT EXTRACTION Left 02/02/2016  . LUMBAR DISC SURGERY     Dr Eddie Dibbles  . no colonoscopy     "scared"; SOC reviewed  . 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; lost  hearing on left ear   Past Medical History:  Diagnosis Date  . Abnormality of gait   . Brachial neuritis or radiculitis NOS   . Cervicalgia   . Facial nerve disorder   . Hearing loss    Left ear  . 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 Hyperlipidemia   . Hypertension   . Macular degeneration   . Myofascial pain   . Pneumonia 2005  . Thyroid disease    hyprothyroidism  . Trigeminal neuralgia    BP (!) 152/74   Pulse 73   SpO2 98%   Opioid Risk Score:  0 Fall Risk Score:  `1  Depression screen PHQ 2/9  Depression screen [facility] 2/9 12/29/2016 02/04/2016 05/22/2014 03/07/2014 12/15/2012  Decreased Interest 0 0 1 0 0  Down, Depressed, Hopeless 0 0 0 0 0  PHQ - 2 Score 0 0 1 0 0  Altered sleeping - - 1 - -  Tired, decreased energy - - 2 - -  Change in appetite - - 0 - -  Feeling  bad or failure about yourself  - - 0 - -  Trouble concentrating - - 1 - -  Moving slowly or fidgety/restless - - 0 - -  Suicidal thoughts - - 0 - -  PHQ-9 Score - - 5 - -      Review of Systems  Constitutional: Positive for unexpected weight change.  HENT: Negative.   Eyes: Negative.   Respiratory: Negative.   Cardiovascular: Positive for leg swelling.  Gastrointestinal: Positive for constipation.  Endocrine: Negative.   Genitourinary: Negative.   Musculoskeletal: Negative.   Skin: Negative.   Allergic/Immunologic: Negative.   Neurological: Negative.   Hematological: Negative.   Psychiatric/Behavioral: Negative.        Objective:   Physical Exam  HENT:  Head: Normocephalic and atraumatic.  Eyes: normal sclera. Non-irritated Cardiovascular:  Regular rate Pulmonary/Chest:  Normal effort Abdominal: Bowel sounds are normal.  Musculoskeletal: Normal range of motion. No pain with right shoulder IR/ER Neurological: She is alert and oriented to person, place, and time.   Left lid- persistent lag, left facial droop a little more prominent, atrophy left facial muscles. Left face remains hypersenstiive to touch. romberg + balance functional during gait.Marland Kitchen  Psychiatric: bright and appropriate.    Assessment & Plan:  ASSESSMENT:  1. History of vestibular schwannoma resulting in trigeminal neuralgia and left facial nerve injury- no tumor regrowth per MRI--has chronic peri-operative scarring and atrophy which accounts for weakness, pain, and balance issues.  -neuro exam remains stable 2. History of cervicalgia and lumbar spine surgery---recent low back strain? 3. Chronic pain related to CN 5 involvement  4. Hypothyroid on supplementation  5. Right shoulder pain==resolved.  6. Reactive depression and anxiety     PLAN:  1. Methadone 10 mg daily, #30 with a refill for today and December. 2. Nucynta 50 mg 1 p.o. q.12 h. p.r.n. 30.  I refilled this  today We will continue the opioid monitoring program, this consists of regular clinic visits, examinations, routine drug screening, pill counts as well as use of [place] Controlled Substance Reporting System. [place] was reviewed today.     3. Thyroid/hormone supplement per primary.  5. Continue with the voltaren gel for face.  Looking at other OTC supplements as well.   6. Eye mgt per optho/ ENT 7. Follow up with me in 2 months.  15 minutes of face to face patient care time  were spent during this visit. All questions were encouraged and answered. Encouraged to her to follow through with eye exam.   Greater than 50% of time during this encounter was spent counseling patient/family in regard to pain management and pacing strategies.Marland Kitchen

## 2016-12-29 NOTE — Patient Instructions (Signed)
PLEASE FEEL FREE TO CALL OUR OFFICE WITH ANY PROBLEMS OR QUESTIONS ([phone])      

## 2017-02-23 ENCOUNTER — Other Ambulatory Visit: Payer: Self-pay

## 2017-02-23 ENCOUNTER — Encounter: Payer: Self-pay | Admitting: Physical Medicine & Rehabilitation

## 2017-02-23 ENCOUNTER — Encounter: Payer: Medicare Other | Attending: Physical Medicine & Rehabilitation | Admitting: Physical Medicine & Rehabilitation

## 2017-02-23 VITALS — BP 126/74 | HR 69

## 2017-02-23 DIAGNOSIS — G500 Trigeminal neuralgia: Secondary | ICD-10-CM

## 2017-02-23 DIAGNOSIS — D333 Benign neoplasm of cranial nerves: Secondary | ICD-10-CM | POA: Diagnosis not present

## 2017-02-23 DIAGNOSIS — Z79899 Other long term (current) drug therapy: Secondary | ICD-10-CM | POA: Diagnosis not present

## 2017-02-23 DIAGNOSIS — G8929 Other chronic pain: Secondary | ICD-10-CM | POA: Diagnosis not present

## 2017-02-23 DIAGNOSIS — Z5181 Encounter for therapeutic drug level monitoring: Secondary | ICD-10-CM | POA: Diagnosis not present

## 2017-02-23 MED ORDER — METHADONE HCL 10 MG PO TABS: 10.0000 mg | ORAL_TABLET | Freq: Every day | ORAL | 0 refills | Status: DC

## 2017-02-23 MED ORDER — TAPENTADOL HCL 50 MG PO TABS
50.0000 mg | ORAL_TABLET | Freq: Two times a day (BID) | ORAL | 0 refills | Status: DC | PRN
Start: 2017-02-23 — End: 2017-06-20

## 2017-02-23 NOTE — Patient Instructions (Signed)
PLEASE FEEL FREE TO CALL OUR OFFICE WITH ANY PROBLEMS OR QUESTIONS ([phone])      

## 2017-02-23 NOTE — Progress Notes (Signed)
Subjective:    Patient ID: [name], female    DOB: [birth year], 72 y.o.   MRN: [id number]  HPI   [name] is here for her chronic pain.  [name] has been doing fairly well since I last saw her.  The colder weather does tend to exacerbate her facial symptoms but she manages.  She continues to work part-time which helps keep her active.  She ran out of her Nucynta yesterday.  She is really only using this at nighttime as it helps with her pain but makes her too sleepy.  The methadone works for daytime control.  Her bowels and bladder remain functional.        Pain Inventory Average Pain 7 Pain Right Now 5 My pain is sharp, burning, stabbing and tingling  In the last 24 hours, has pain interfered with the following? General activity 8 Relation with others 9 Enjoyment of life 10 What TIME of day is your pain at its worst? morning and daytime Sleep (in general) Good  Pain is worse with: . Pain improves with: rest, heat/ice and medication Relief from Meds: 8  Mobility walk without assistance ability to climb steps?  yes do you drive?  yes transfers alone Do you have any goals in this area?  no  Function employed # of hrs/week 2005 retired I need assistance with the following:  household duties and shopping  Neuro/Psych trouble walking dizziness anxiety loss of taste or smell  Prior Studies Any changes since last visit?  no  Physicians involved in your care Any changes since last visit?  no   Family History  Problem Relation Age of Onset  . Hypertension Mother   . Transient ischemic attack Mother   . Coronary artery disease Father        CHF  . Coronary artery disease Sister   . Macular degeneration Sister   . Transient ischemic attack Maternal Grandmother   . Diabetes Neg Hx   . Cancer Neg Hx    Social History   Socioeconomic History  . Marital status: Married    Spouse name: None  . Number of children: 1  . Years of education: None  .  Highest education level: None  Social Needs  . Financial resource strain: None  . Food insecurity - worry: None  . Food insecurity - inability: None  . Transportation needs - medical: None  . Transportation needs - non-medical: None  Occupational History  . Occupation: Retired  Tobacco Use  . Smoking status: Never Smoker  . Smokeless tobacco: Never Used  Substance and Sexual Activity  . Alcohol use: No    Alcohol/week: 0.0 oz  . Drug use: No  . Sexual activity: None  Other Topics Concern  . None  Social History Narrative  . None   Past Surgical History:  Procedure Laterality Date  . ABDOMINAL HYSTERECTOMY    . [place]   closed hole in heart  . CATARACT EXTRACTION Left 02/02/2016  . LUMBAR DISC SURGERY     Dr Eddie Dibbles  . no colonoscopy     "scared"; SOC reviewed  . 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 Abnormality of gait   . Brachial neuritis or radiculitis NOS   . Cervicalgia   . Facial nerve disorder   . Hearing loss    Left ear  . 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 Hyperlipidemia   . Hypertension   . Macular degeneration   . Myofascial pain   . Pneumonia 2005  . Thyroid disease    hyprothyroidism  . Trigeminal neuralgia    There were no vitals taken for this visit.  Opioid Risk Score:  0 Fall Risk Score:  `1  Depression screen PHQ 2/9  Depression screen [facility] 2/9 02/23/2017 12/29/2016 02/04/2016 05/22/2014 03/07/2014 12/15/2012  Decreased Interest 0 0 0 1 0 0  Down, Depressed, Hopeless 0 0 0 0 0 0  PHQ - 2 Score 0 0 0 1 0 0  Altered sleeping - - - 1 - -  Tired, decreased energy - - - 2 - -  Change in appetite - - - 0 - -  Feeling bad or failure about yourself  - - - 0 - -  Trouble concentrating - - - 1 - -  Moving slowly or fidgety/restless - - - 0 - -  Suicidal thoughts - - - 0 - -  PHQ-9 Score - - - 5 - -     Review of Systems    Constitutional: Positive for unexpected weight change.  HENT: Negative.   Eyes: Negative.   Respiratory: Negative.   Cardiovascular: Positive for leg swelling.  Gastrointestinal: Positive for constipation.  Endocrine: Negative.   Genitourinary: Negative.   Musculoskeletal: Negative.   Skin: Negative.   Allergic/Immunologic: Negative.   Neurological: Negative.   Hematological: Negative.   Psychiatric/Behavioral: Negative.        Objective:   Physical Exam   HENT:  Head: Normocephalic and atraumatic.  Eyes: normal sclera. Non-irritated Cardiovascular: reg rate Pulmonary/Chest: normal effort Abdominal: Bowel sounds are normal.  Musculoskeletal: Normal range of motion. No pain with right shoulder IR/ER Neurological: She is alert and oriented to person, place, and time. Left lid- persistent lag, left facial droop a little more prominent, ongoing left facial muscles. Left face is hypersenstiive to touch. romberg + balance functional during gait.Marland Kitchen  Psychiatric:bright and appropriate.    Assessment & Plan:  ASSESSMENT:  1. History of vestibular schwannoma resulting in trigeminal neuralgia and left facial nerve injury- no tumor regrowth per MRI--has chronic peri-operative scarring and atrophy which accounts for weakness, pain, and balance issues.  -neuro exam remainsstable 2. History of cervicalgia and lumbar spine surgery---recent low back strain? 3. Chronic pain related to CN 5 involvement  4. Hypothyroid on supplementation  5. Right shoulder pain==resolved.  6. Reactive depression and anxiety     PLAN:  1. Methadone 10 mg daily, #30 with a refill for today. 2. Nucynta 50 mg 1 p.o. q.12 h. p.r.n. 30. provided a new rx today. (discarded old one) We will continue the opioid monitoring program, this consists of regular clinic visits, examinations, routine drug screening, pill counts as well as use of [place] Controlled Substance Reporting  System. [place] was [place] today.     3. Thyroid/hormone supplement per primary.  5. continue with voltaren gel, topicals.   6.Eye mgt per optho/ ENT--nothing new here 7. Follow up with me in 2 months.  6minutes of face to face patient care time were spent during this visit. All questions were encouraged and answered. Encouraged to her to follow through with eye exam. Greater than 50% of time during this encounter was spent counseling patient/family in regard to pain mgt strategies.

## 2017-02-28 LAB — DRUG TOX MONITOR 1 W/CONF, ORAL FLD
AMPHETAMINES: NEGATIVE ng/mL (ref <10)
BARBITURATES: NEGATIVE ng/mL (ref <10)
BENZODIAZEPINES: NEGATIVE ng/mL (ref <0.50)
Buprenorphine: NEGATIVE ng/mL (ref <0.10)
Cocaine: NEGATIVE ng/mL (ref <5.0)
EDDP: NEGATIVE ng/mL (ref <5.0)
Fentanyl: NEGATIVE ng/mL (ref <0.10)
HEROIN METABOLITE: NEGATIVE ng/mL (ref <1.0)
MARIJUANA: NEGATIVE ng/mL (ref <2.5)
MDMA: NEGATIVE ng/mL (ref <10)
METHADONE: POSITIVE ng/mL — AB (ref <5.0)
Meprobamate: NEGATIVE ng/mL (ref <2.5)
Methadone: 14.5 ng/mL — ABNORMAL HIGH (ref <5.0)
Nicotine Metabolite: NEGATIVE ng/mL (ref <5.0)
OPIATES: NEGATIVE ng/mL (ref <2.5)
Phencyclidine: NEGATIVE ng/mL (ref <10)
TAPENTADOL: 8.9 ng/mL — AB (ref <5.0)
TRAMADOL: NEGATIVE ng/mL (ref <5.0)
Tapentadol: POSITIVE ng/mL — AB (ref <5.0)
Zolpidem: NEGATIVE ng/mL (ref <5.0)

## 2017-02-28 LAB — DRUG TOX ALC METAB W/CON, ORAL FLD: ALCOHOL METABOLITE: NEGATIVE ng/mL (ref <25)

## 2017-03-01 ENCOUNTER — Telehealth: Payer: Self-pay | Admitting: *Deleted

## 2017-03-01 NOTE — Telephone Encounter (Signed)
Oral swab drug screen was consistent for prescribed medications.  ?

## 2017-03-21 ENCOUNTER — Ambulatory Visit: Payer: Medicare Other | Admitting: Interventional Cardiology

## 2017-04-19 ENCOUNTER — Encounter: Payer: Medicare Other | Attending: Physical Medicine & Rehabilitation | Admitting: Physical Medicine & Rehabilitation

## 2017-04-19 ENCOUNTER — Encounter: Payer: Self-pay | Admitting: Physical Medicine & Rehabilitation

## 2017-04-19 VITALS — BP 150/84 | HR 73

## 2017-04-19 DIAGNOSIS — G8929 Other chronic pain: Secondary | ICD-10-CM | POA: Insufficient documentation

## 2017-04-19 DIAGNOSIS — D333 Benign neoplasm of cranial nerves: Secondary | ICD-10-CM | POA: Diagnosis not present

## 2017-04-19 DIAGNOSIS — G500 Trigeminal neuralgia: Secondary | ICD-10-CM

## 2017-04-19 DIAGNOSIS — S0452XS Injury of facial nerve, left side, sequela: Secondary | ICD-10-CM | POA: Diagnosis not present

## 2017-04-19 DIAGNOSIS — R269 Unspecified abnormalities of gait and mobility: Secondary | ICD-10-CM

## 2017-04-19 MED ORDER — METHADONE HCL 10 MG PO TABS: 10.0000 mg | ORAL_TABLET | Freq: Every day | ORAL | 0 refills | Status: DC

## 2017-04-19 NOTE — Patient Instructions (Signed)
PLEASE FEEL FREE TO CALL OUR OFFICE WITH ANY PROBLEMS OR QUESTIONS ([phone])      

## 2017-04-19 NOTE — Progress Notes (Signed)
Subjective:    Patient ID: [name], female    DOB: 04-28-1945, 72 y.o.   MRN: [id number]  HPI   [name] is here in follow-up of her vestibular schwannoma and associated pain and functional deficits.  From a pain level she has been doing fairly well.  She does have some concerns over the vision through her left thigh as well as her balance.  She finds that if she is not looking down at the ground when she walks that she has difficulty with her balance still.  She plans on going back to see her eye doctor for follow-up.  She does struggle somewhat with the cooler temperatures and fluctuations that we have been having lately and whether.  However medication seems to be helping still.  She uses her methadone 10 mg daily and uses a Nucynta at night perhaps once or twice per week when the pain is more severe.  Pain Inventory Average Pain 7 Pain Right Now 4 My pain is sharp, burning, stabbing and tingling  In the last 24 hours, has pain interfered with the following? General activity 8 Relation with others 9 Enjoyment of life 9 What TIME of day is your pain at its worst? daytime Sleep (in general) Good  Pain is worse with: . Pain improves with: rest, heat/ice and medication Relief from Meds: 8  Mobility walk without assistance ability to climb steps?  yes do you drive?  yes  Function retired  Neuro/Psych dizziness anxiety loss of taste or smell  Prior Studies Any changes since last visit?  no  Physicians involved in your care Any changes since last visit?  no   Family History  Problem Relation Age of Onset  . Hypertension Mother   . Transient ischemic attack Mother   . Coronary artery disease Father        CHF  . Coronary artery disease Sister   . Macular degeneration Sister   . Transient ischemic attack Maternal Grandmother   . Diabetes Neg Hx   . Cancer Neg Hx    Social History   Socioeconomic History  . Marital status: Married    Spouse name: Not  on file  . Number of children: 1  . Years of education: Not on file  . Highest education level: Not on file  Social Needs  . Financial resource strain: Not on file  . Food insecurity - worry: Not on file  . Food insecurity - inability: Not on file  . Transportation needs - medical: Not on file  . Transportation needs - non-medical: Not on file  Occupational History  . Occupation: Retired  Tobacco Use  . Smoking status: Never Smoker  . Smokeless tobacco: Never Used  Substance and Sexual Activity  . Alcohol use: No    Alcohol/week: 0.0 oz  . Drug use: No  . Sexual activity: Not on file  Other Topics Concern  . Not on file  Social History Narrative  . Not on file   Past Surgical History:  Procedure Laterality Date  . ABDOMINAL HYSTERECTOMY    . [place]   closed hole in heart  . CATARACT EXTRACTION Left 02/02/2016  . LUMBAR DISC SURGERY     Dr Eddie Dibbles  . no colonoscopy     "scared"; SOC reviewed  . 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 Abnormality of gait   .  Brachial neuritis or radiculitis NOS   . Cervicalgia   . Facial nerve disorder   . Hearing loss    Left ear  . 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 Hyperlipidemia   . Hypertension   . Macular degeneration   . Myofascial pain   . Pneumonia 2005  . Thyroid disease    hyprothyroidism  . Trigeminal neuralgia    There were no vitals taken for this visit.  Opioid Risk Score:   Fall Risk Score:  `1  Depression screen PHQ 2/9  Depression screen [facility] 2/9 02/23/2017 12/29/2016 02/04/2016 05/22/2014 03/07/2014 12/15/2012  Decreased Interest 0 0 0 1 0 0  Down, Depressed, Hopeless 0 0 0 0 0 0  PHQ - 2 Score 0 0 0 1 0 0  Altered sleeping - - - 1 - -  Tired, decreased energy - - - 2 - -  Change in appetite - - - 0 - -  Feeling bad or failure about yourself  - - - 0 - -  Trouble concentrating - - - 1 - -    Moving slowly or fidgety/restless - - - 0 - -  Suicidal thoughts - - - 0 - -  PHQ-9 Score - - - 5 - -     Review of Systems  Constitutional: Positive for unexpected weight change.  HENT: Negative.   Eyes: Negative.   Respiratory: Negative.   Cardiovascular: Negative.   Gastrointestinal: Positive for constipation.  Endocrine: Negative.   Genitourinary: Positive for difficulty urinating.  Musculoskeletal: Positive for joint swelling.  Skin: Negative.   Allergic/Immunologic: Negative.   Neurological: Negative.   Hematological: Negative.   Psychiatric/Behavioral: Negative.   All other systems reviewed and are negative.      Objective:   Physical Exam   HENT:  Head: Normocephalic and atraumatic.  Eyes: Left lid unchanged.  Sclera white Cardiovascular:Regular rate Pulmonary/Chest:Normal effort Abdominal: Bowel sounds are normal.  Musculoskeletal: Normal range of motion. No pain with right shoulder IR/ER Neurological: She is alert and oriented to person, place, and time. Left lid- persistent lag, left facial droop unchanged.  Speech is clear.. Left face is hypersenstiive to touch. Romberg is positive.  During gait she is generally functional but does tend to look down to the ground to help assist with her balance.  Psychiatric:bright and appropriate.    Assessment & Plan:  ASSESSMENT:  1. History of vestibular schwannoma resulting in trigeminal neuralgia and left facial nerve injury- no tumor regrowth per MRI--has chronic peri-operative scarring and atrophy which accounts for weakness, pain, and balance issues.  -no neuro changes, still dealing with chronic issues. 2. History of cervicalgia and lumbar spine surgery---recent low back strain? 3. Chronic pain related to CN 5 involvement  4. Hypothyroid on supplementation  5. Right shoulder pain==resolved.  6. Reactive depression and anxiety     PLAN:  1. Methadone 10 mg daily, #30 with a  refill for today.  2. Nucynta 50 mg 1 p.o. q.12 h. p.r.n. 30. We will continue the opioid monitoring program, this consists of regular clinic visits, examinations, routine drug screening, pill counts as well as use of [place] Controlled Substance Reporting System. [place] was [place] today.   3. Thyroid/hormone supplement per primary.  4. Consider re-visiting PT for balance assessment and coping techniques.  She will speak with her eye doctor first regarding her vision but does seem open to revisiting therapy. 5.continue with voltaren gel, topicals. 6.Eye mgt  per optho/ENT--stable 7.Follow up with me in 2 months. 42minutes of face to face patient care time were spent during this visit. All questions were encouraged and answered. Encouraged to her to follow through with eye exam.Greater than 50% of time during this encounter was spent counseling patient/family in regard to ongoing pain coping skills, education re: balance/safety.

## 2017-05-24 DIAGNOSIS — J320 Chronic maxillary sinusitis: Secondary | ICD-10-CM | POA: Diagnosis not present

## 2017-05-24 DIAGNOSIS — J322 Chronic ethmoidal sinusitis: Secondary | ICD-10-CM | POA: Diagnosis not present

## 2017-05-24 DIAGNOSIS — H903 Sensorineural hearing loss, bilateral: Secondary | ICD-10-CM | POA: Diagnosis not present

## 2017-05-24 DIAGNOSIS — H8141 Vertigo of central origin, right ear: Secondary | ICD-10-CM | POA: Diagnosis not present

## 2017-05-24 DIAGNOSIS — H9042 Sensorineural hearing loss, unilateral, left ear, with unrestricted hearing on the contralateral side: Secondary | ICD-10-CM | POA: Diagnosis not present

## 2017-06-01 ENCOUNTER — Encounter (INDEPENDENT_AMBULATORY_CARE_PROVIDER_SITE_OTHER): Payer: Medicare Other | Admitting: Ophthalmology

## 2017-06-01 DIAGNOSIS — H43813 Vitreous degeneration, bilateral: Secondary | ICD-10-CM | POA: Diagnosis not present

## 2017-06-01 DIAGNOSIS — I10 Essential (primary) hypertension: Secondary | ICD-10-CM | POA: Diagnosis not present

## 2017-06-01 DIAGNOSIS — H353132 Nonexudative age-related macular degeneration, bilateral, intermediate dry stage: Secondary | ICD-10-CM

## 2017-06-01 DIAGNOSIS — H35033 Hypertensive retinopathy, bilateral: Secondary | ICD-10-CM

## 2017-06-08 ENCOUNTER — Ambulatory Visit (INDEPENDENT_AMBULATORY_CARE_PROVIDER_SITE_OTHER): Payer: Medicare Other | Admitting: Ophthalmology

## 2017-06-20 ENCOUNTER — Encounter: Payer: Medicare Other | Attending: Physical Medicine & Rehabilitation | Admitting: Physical Medicine & Rehabilitation

## 2017-06-20 ENCOUNTER — Encounter: Payer: Self-pay | Admitting: Physical Medicine & Rehabilitation

## 2017-06-20 VITALS — BP 137/60 | HR 77 | Ht 64.0 in | Wt 165.0 lb

## 2017-06-20 DIAGNOSIS — S0452XS Injury of facial nerve, left side, sequela: Secondary | ICD-10-CM

## 2017-06-20 DIAGNOSIS — G8929 Other chronic pain: Secondary | ICD-10-CM | POA: Insufficient documentation

## 2017-06-20 DIAGNOSIS — D333 Benign neoplasm of cranial nerves: Secondary | ICD-10-CM | POA: Diagnosis not present

## 2017-06-20 DIAGNOSIS — R269 Unspecified abnormalities of gait and mobility: Secondary | ICD-10-CM

## 2017-06-20 DIAGNOSIS — G500 Trigeminal neuralgia: Secondary | ICD-10-CM | POA: Diagnosis not present

## 2017-06-20 MED ORDER — TAPENTADOL HCL 50 MG PO TABS: 50.0000 mg | ORAL_TABLET | Freq: Two times a day (BID) | ORAL | 0 refills | Status: DC | PRN

## 2017-06-20 MED ORDER — METHADONE HCL 10 MG PO TABS: 10.0000 mg | ORAL_TABLET | Freq: Every day | ORAL | 0 refills | Status: DC

## 2017-06-20 MED ORDER — METHADONE HCL 10 MG PO TABS
10.0000 mg | ORAL_TABLET | Freq: Every day | ORAL | 0 refills | Status: DC
Start: 2017-06-20 — End: 2017-06-20

## 2017-06-20 NOTE — Patient Instructions (Signed)
PLEASE FEEL FREE TO CALL OUR OFFICE WITH ANY PROBLEMS OR QUESTIONS ([phone])      

## 2017-06-20 NOTE — Progress Notes (Signed)
Dr. Naaman Plummer declined drug screen today

## 2017-06-20 NOTE — Progress Notes (Signed)
Subjective:    Patient ID: [name], female    DOB: [birth year], 72 y.o.   MRN: [id number]  HPI [name] is back regarding her chronic pain. She has been stable from a standpoint of her pain. She has suffered with some seasonal allergies like many. It does affect her left eye more because of her lid weakness.   She still is working. Fatigue can be an issue at times but she knows her limits. She denies any falls. She is looking forward to a trip to the beach coming up later this month.  Bowel and bladder function is normal  Pain Inventory Average Pain 6 Pain Right Now 6 My pain is burning, stabbing and tingling  In the last 24 hours, has pain interfered with the following? General activity 6 Relation with others 8 Enjoyment of life 7 What TIME of day is your pain at its worst? daytime, evening Sleep (in general) Good  Pain is worse with: some activites Pain improves with: rest, heat/ice and medication Relief from Meds: 8  Mobility walk without assistance ability to climb steps?  yes do you drive?  yes transfers alone Do you have any goals in this area?  no  Function retired I need assistance with the following:  household duties and shopping  Neuro/Psych dizziness anxiety  Prior Studies Any changes since last visit?  no  Physicians involved in your care Any changes since last visit?  no   Family History  Problem Relation Age of Onset  . Hypertension Mother   . Transient ischemic attack Mother   . Coronary artery disease Father        CHF  . Coronary artery disease Sister   . Macular degeneration Sister   . Transient ischemic attack Maternal Grandmother   . Diabetes Neg Hx   . Cancer Neg Hx    Social History   Socioeconomic History  . Marital status: Married    Spouse name: Not on file  . Number of children: 1  . Years of education: Not on file  . Highest education level: Not on file  Occupational History  . Occupation: Retired  Photographer  . Financial resource strain: Not on file  . Food insecurity:    Worry: Not on file    Inability: Not on file  . Transportation needs:    Medical: Not on file    Non-medical: Not on file  Tobacco Use  . Smoking status: Never Smoker  . Smokeless tobacco: Never Used  Substance and Sexual Activity  . Alcohol use: No    Alcohol/week: 0.0 oz  . Drug use: No  . Sexual activity: Not on file  Lifestyle  . Physical activity:    Days per week: Not on file    Minutes per session: Not on file  . Stress: Not on file  Relationships  . Social connections:    Talks on phone: Not on file    Gets together: Not on file    Attends religious service: Not on file    Active member of club or organization: Not on file    Attends meetings of clubs or organizations: Not on file    Relationship status: Not on file  Other Topics Concern  . Not on file  Social History Narrative  . Not on file   Past Surgical History:  Procedure Laterality Date  . ABDOMINAL HYSTERECTOMY    . [place]   closed hole in heart  .  CATARACT EXTRACTION Left 02/02/2016  . LUMBAR DISC SURGERY     Dr Eddie Dibbles  . no colonoscopy     "scared"; SOC reviewed  . 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 Abnormality of gait   . Brachial neuritis or radiculitis NOS   . Cervicalgia   . Facial nerve disorder   . Hearing loss    Left ear  . 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 Hyperlipidemia   . Hypertension   . Macular degeneration   . Myofascial pain   . Pneumonia 2005  . Thyroid disease    hyprothyroidism  . Trigeminal neuralgia    BP 137/60 (BP Location: Right Arm, Patient Position: Sitting, Cuff Size: Normal)   Pulse 77   Ht 5\' 4"  (1.626 m)   Wt 165 lb (74.8 kg)   SpO2 97%   BMI 28.32 kg/m   Opioid Risk Score:   Fall Risk Score:  `1  Depression screen PHQ 2/9  Depression screen [facility] 2/9 02/23/2017  12/29/2016 02/04/2016 05/22/2014 03/07/2014 12/15/2012  Decreased Interest 0 0 0 1 0 0  Down, Depressed, Hopeless 0 0 0 0 0 0  PHQ - 2 Score 0 0 0 1 0 0  Altered sleeping - - - 1 - -  Tired, decreased energy - - - 2 - -  Change in appetite - - - 0 - -  Feeling bad or failure about yourself  - - - 0 - -  Trouble concentrating - - - 1 - -  Moving slowly or fidgety/restless - - - 0 - -  Suicidal thoughts - - - 0 - -  PHQ-9 Score - - - 5 - -    Review of Systems  Constitutional: Positive for unexpected weight change.  HENT: Negative.   Eyes: Negative.   Respiratory: Negative.   Cardiovascular: Positive for leg swelling.  Gastrointestinal: Positive for constipation.  Endocrine: Negative.   Genitourinary: Positive for difficulty urinating.  Musculoskeletal: Negative.   Allergic/Immunologic: Negative.   Neurological: Positive for dizziness and facial asymmetry.  Hematological: Negative.   Psychiatric/Behavioral: The patient is nervous/anxious.        Objective:   Physical Exam  Constitutional: No distress . Vital signs reviewed. HEENT: EOMI, oral membranes moist. Left eye sclera sl red Neck: supple Cardiovascular: RRR without murmur. No JVD    Respiratory: CTA Bilaterally without wheezes or rales. Normal effort    GI: BS +, non-tender, non-distended  Musculoskeletal: Normal range of motion. No pain with right shoulder IR/ER Neurological: She is alert and oriented to person, place, and time. Left lid- persistent lag, left facial droop unchanged.  Speech is clear.. Left face remains hypersenstiive to touch. Romberg is positive.  During gait she is generally functional but does tend to look down to the ground to help assist with her balance.  Psychiatric:pleasant as always   Assessment & Plan:  ASSESSMENT:  1. History of vestibular schwannoma resulting in trigeminal neuralgia and left facial nerve injury- no tumor regrowth per MRI--has chronic peri-operative scarring and  atrophy which accounts for weakness, pain, and balance issues.  -no neuro changes, still dealing with chronic issues. 2. History of cervicalgia and lumbar spine surgery---recent low back strain? 3. Chronic pain related to CN 5 involvement  4. Hypothyroid on supplementation  5. Right shoulder pain==resolved.  6. Reactive depression and anxiety  PLAN:  1. Methadone 10 mg daily, #30refilled.  Second rx for next month. 2. Nucynta 50 mg 1 p.o. q.12 h. p.r.n. 30. This was refilled today We will continue the controlled substance monitoring program, this consists of regular clinic visits, examinations, routine drug screening, pill counts as well as use of [place] Controlled Substance Reporting System. [place] was [place] today.   3. Thyroid/hormone supplement per primary.  4. Continue HEP for balance.. 5.continue with voltaren gel, topicals. 6.Eye mgt per optho/ENT--no change 7.Follow up with me in58months. 15 minutes of face to face patient care time were spent during this visit. All questions were encouraged and answered. Encouraged to her to follow through with eye exam.Greater than 50% of time during this encounter was spent counseling patient/family in regard to HEP, balance.

## 2017-07-29 ENCOUNTER — Telehealth: Payer: Self-pay

## 2017-07-29 ENCOUNTER — Telehealth: Payer: Self-pay | Admitting: *Deleted

## 2017-07-29 NOTE — Telephone Encounter (Signed)
Elisea is reporting that [facility] says they do not have her Methadone Rx for June.  Per epic note it was received by pharmacy 06/20/17 with DNF before 07/19/17 @9 :42 am. I called the pharmacy and they DO have it. Hassan Rowan notified. (They told her they did not have it).

## 2017-07-29 NOTE — Telephone Encounter (Signed)
error 

## 2017-08-23 ENCOUNTER — Encounter: Payer: Medicare Other | Attending: Physical Medicine & Rehabilitation | Admitting: Physical Medicine & Rehabilitation

## 2017-08-23 ENCOUNTER — Encounter: Payer: Self-pay | Admitting: Physical Medicine & Rehabilitation

## 2017-08-23 VITALS — BP 144/77 | HR 55 | Resp 14 | Ht 63.0 in | Wt 166.0 lb

## 2017-08-23 DIAGNOSIS — G500 Trigeminal neuralgia: Secondary | ICD-10-CM | POA: Diagnosis not present

## 2017-08-23 DIAGNOSIS — G894 Chronic pain syndrome: Secondary | ICD-10-CM

## 2017-08-23 DIAGNOSIS — Z5181 Encounter for therapeutic drug level monitoring: Secondary | ICD-10-CM

## 2017-08-23 DIAGNOSIS — G8929 Other chronic pain: Secondary | ICD-10-CM | POA: Insufficient documentation

## 2017-08-23 DIAGNOSIS — D333 Benign neoplasm of cranial nerves: Secondary | ICD-10-CM | POA: Diagnosis not present

## 2017-08-23 DIAGNOSIS — Z79891 Long term (current) use of opiate analgesic: Secondary | ICD-10-CM | POA: Diagnosis not present

## 2017-08-23 MED ORDER — METHADONE HCL 10 MG PO TABS: 10.0000 mg | ORAL_TABLET | Freq: Every day | ORAL | 0 refills | Status: DC

## 2017-08-23 MED ORDER — METHADONE HCL 10 MG PO TABS
10.0000 mg | ORAL_TABLET | Freq: Every day | ORAL | 0 refills | Status: DC
Start: 2017-08-23 — End: 2017-08-23

## 2017-08-23 MED ORDER — TAPENTADOL HCL 50 MG PO TABS: 50.0000 mg | ORAL_TABLET | Freq: Two times a day (BID) | ORAL | 0 refills | Status: DC | PRN

## 2017-08-23 NOTE — Progress Notes (Signed)
Subjective:    Patient ID: [name], female    DOB: Jul 21, 1945, 72 y.o.   MRN: [id number]  HPI   [name] is here in follow up of her chronic pain and gait disorder. Her pain levels have been pretty stable. She remains on methadone for daily pain control. She uses nucynta at night for more severe pain.   She has noticed swelling in both legs as well as some shortness of breath for the last 3 weeks or so. She becomes short of breath with some physical activities and has had a few palpitations as well. She has not seen her primary doctor. 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        Pain Inventory Average Pain 7 Pain Right Now 7 My pain is burning, tingling and aching  In the last 24 hours, has pain interfered with the following? General activity 9 Relation with others 9 Enjoyment of life 9 What TIME of day is your pain at its worst? daytime, evening  Sleep (in general) Good  Pain is worse with: unsure Pain improves with: rest, heat/ice and medication Relief from Meds: 7  Mobility walk without assistance ability to climb steps?  yes do you drive?  yes Do you have any goals in this area?  no  Function employed # of hrs/week . retired I need assistance with the following:  household duties and shopping  Neuro/Psych tingling dizziness anxiety loss of taste or smell  Prior Studies Any changes since last visit?  no  Physicians involved in your care Any changes since last visit?  no   Family History  Problem Relation Age of Onset  . Hypertension Mother   . Transient ischemic attack Mother   . Coronary artery disease Father        CHF  . Coronary artery disease Sister   . Macular degeneration Sister   . Transient ischemic attack Maternal Grandmother   . Diabetes Neg Hx   . Cancer Neg Hx    Social History   Socioeconomic History  . Marital  status: Married    Spouse name: Not on file  . Number of children: 1  . Years of education: Not on file  . Highest education level: Not on file  Occupational History  . Occupation: Retired  Scientific laboratory technician  . Financial resource strain: Not on file  . Food insecurity:    Worry: Not on file    Inability: Not on file  . Transportation needs:    Medical: Not on file    Non-medical: Not on file  Tobacco Use  . Smoking status: Never Smoker  . Smokeless tobacco: Never Used  Substance and Sexual Activity  . Alcohol use: No    Alcohol/week: 0.0 oz  . Drug use: No  . Sexual activity: Not on file  Lifestyle  . Physical activity:    Days per week: Not on file    Minutes per session: Not on file  . Stress: Not on file  Relationships  . Social connections:    Talks on phone: Not on file    Gets together: Not on file    Attends religious service: Not on file    Active member of club or organization: Not on file    Attends meetings of clubs or organizations: Not on file    Relationship status: Not on file  Other Topics Concern  .  Not on file  Social History Narrative  . Not on file   Past Surgical History:  Procedure Laterality Date  . ABDOMINAL HYSTERECTOMY    . [place]   closed hole in heart  . CATARACT EXTRACTION Left 02/02/2016  . LUMBAR DISC SURGERY     Dr [name]  . no colonoscopy     "scared"; SOC reviewed  . 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 Abnormality of gait   . Brachial neuritis or radiculitis NOS   . Cervicalgia   . Facial nerve disorder   . Hearing loss    Left ear  . 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 Hyperlipidemia   . Hypertension   . Macular degeneration   . Myofascial pain   . Pneumonia 2005  . Thyroid disease    hyprothyroidism  . Trigeminal neuralgia    BP (!) 144/77 (BP Location: Left Arm, Patient Position: Sitting, Cuff Size:  Normal)   Pulse (!) 55   Resp 14   Ht 5\' 3"  (1.6 m)   Wt 166 lb (75.3 kg)   SpO2 95%   BMI 29.41 kg/m   Opioid Risk Score:   Fall Risk Score:  `1  Depression screen PHQ 2/9  Depression screen [facility] 2/9 06/20/2017 02/23/2017 12/29/2016 02/04/2016 05/22/2014 03/07/2014 12/15/2012  Decreased Interest 0 0 0 0 1 0 0  Down, Depressed, Hopeless 0 0 0 0 0 0 0  PHQ - 2 Score 0 0 0 0 1 0 0  Altered sleeping - - - - 1 - -  Tired, decreased energy - - - - 2 - -  Change in appetite - - - - 0 - -  Feeling bad or failure about yourself  - - - - 0 - -  Trouble concentrating - - - - 1 - -  Moving slowly or fidgety/restless - - - - 0 - -  Suicidal thoughts - - - - 0 - -  PHQ-9 Score - - - - 5 - -    Review of Systems  Constitutional: Positive for unexpected weight change.  Eyes: Negative.   Respiratory: Negative.   Cardiovascular: Positive for leg swelling.  Gastrointestinal: Positive for constipation.  Endocrine: Negative.   Genitourinary: Positive for difficulty urinating.  Musculoskeletal: Negative.   Skin: Negative.   Allergic/Immunologic: Negative.   Neurological: Positive for dizziness and facial asymmetry.       Tingling  Psychiatric/Behavioral: The patient is nervous/anxious.        Objective:   Physical Exam General: No acute distress HEENT: EOMI, oral membranes moist Cards: reg rate no murmurs Chest: normal effort, no rales. Abdomen: Soft, NT, ND Skin: dry, intact Extremities: 1++ pedal edema Musculoskeletal: Normal range of motion. No pain with right shoulder IR/ER Neurological: She is alert and oriented to person, place, and time. Left lid- persistent lag,left facial droop unchanged. Speech is clear.. Left face remains hypersenstiive to touch. Romberg is positive. During gait she is generally functional but does tend to look down to the ground to help assist with her balance. Psychiatric:pleasant as always   Assessment & Plan:  ASSESSMENT:  1. History of  vestibular schwannoma resulting in trigeminal neuralgia and left facial nerve injury- no tumor regrowth per MRI--has chronic peri-operative scarring and atrophy which accounts for weakness, pain, and balance issues.  -no neuro  changes, still dealing with chronic issues. 2. History of cervicalgia and lumbar spine surgery---recent low back strain? 3. Chronic pain related to CN 5 involvement  4. Hypothyroid on supplementation  5. Right shoulder pain==resolved.  6. Reactive depression and anxiety     PLAN:  1. Methadone 10 mg daily, #30refilled.  Second rx for next month provided. 2. Nucynta 50 mg 1 p.o. q.12 h. p.r.n. 30. This was refilled today We will continue the controlled substance monitoring program, this consists of regular clinic visits, examinations, routine drug screening, pill counts as well as use of [place] Controlled Substance Reporting System. [place] was [place] today.   CSA renewed Drug swab today 3. Recommend visit with primary or cardiology regarding her edema and sob/fatigue. Her lungs were clear and heart was regular on exam today. May want to try some TEDS to help with edema in the short term, elevate legs when possible. . 4. Continue HEP for balance.. 5.continue with voltaren gel, topicals. 6.Eye mgt per optho/ENT--no change 7.Follow up with me in2 months. 15 minutes of face to face patient care time were spent during this visit. All questions were encouraged and answered. Encouraged to her to follow through with eye exam.

## 2017-08-23 NOTE — Patient Instructions (Signed)
PLEASE FEEL FREE TO CALL OUR OFFICE WITH ANY PROBLEMS OR QUESTIONS ([phone])     SCHEDULE AN APPOINTMENT WITH CARDIOLOGY OR YOUR PRIMARY ABOUT YOUR SWELLING AND SHORTNESS OF BREATH.

## 2017-08-26 ENCOUNTER — Telehealth: Payer: Self-pay | Admitting: *Deleted

## 2017-08-26 LAB — DRUG TOX MONITOR 1 W/CONF, ORAL FLD
ALPRAZOLAM: NEGATIVE ng/mL (ref <0.50)
Amphetamines: NEGATIVE ng/mL (ref <10)
BUPRENORPHINE: NEGATIVE ng/mL (ref <0.10)
Barbiturates: NEGATIVE ng/mL (ref <10)
Benzodiazepines: POSITIVE ng/mL — AB (ref <0.50)
CHLORDIAZEPOXIDE: NEGATIVE ng/mL (ref <0.50)
Clonazepam: NEGATIVE ng/mL (ref <0.50)
Cocaine: NEGATIVE ng/mL (ref <5.0)
DIAZEPAM: NEGATIVE ng/mL (ref <0.50)
EDDP: NEGATIVE ng/mL (ref <5.0)
FLUNITRAZEPAM: NEGATIVE ng/mL (ref <0.50)
Fentanyl: NEGATIVE ng/mL (ref <0.10)
Flurazepam: NEGATIVE ng/mL (ref <0.50)
HEROIN METABOLITE: NEGATIVE ng/mL (ref <1.0)
Lorazepam: NEGATIVE ng/mL (ref <0.50)
MARIJUANA: NEGATIVE ng/mL (ref <2.5)
MDMA: NEGATIVE ng/mL (ref <10)
MEPROBAMATE: NEGATIVE ng/mL (ref <2.5)
METHADONE: 78.2 ng/mL — AB (ref <5.0)
METHADONE: POSITIVE ng/mL — AB (ref <5.0)
Midazolam: NEGATIVE ng/mL (ref <0.50)
NICOTINE METABOLITE: NEGATIVE ng/mL (ref <5.0)
NORDIAZEPAM: 0.73 ng/mL — AB (ref <0.50)
OXAZEPAM: NEGATIVE ng/mL (ref <0.50)
Opiates: NEGATIVE ng/mL (ref <2.5)
Phencyclidine: NEGATIVE ng/mL (ref <10)
TRIAZOLAM: NEGATIVE ng/mL (ref <0.50)
Tapentadol: NEGATIVE ng/mL (ref <5.0)
Temazepam: NEGATIVE ng/mL (ref <0.50)
Tramadol: NEGATIVE ng/mL (ref <5.0)
Zolpidem: NEGATIVE ng/mL (ref <5.0)

## 2017-08-26 LAB — DRUG TOX ALC METAB W/CON, ORAL FLD: ALCOHOL METABOLITE: NEGATIVE ng/mL (ref <25)

## 2017-08-26 NOTE — Telephone Encounter (Signed)
Oral swab drug screen was consistent for prescribed medications. Nucynta was taken 2 days before test so negative is expected.

## 2017-09-04 IMAGING — US US EXTREM LOW VENOUS*L*
1 series · 13 of 24 positions shown · non-contrast
Comparison: None.

CLINICAL DATA: 70-year-old female with a history of pain and
swelling



[Series 1: us extrem low venous*left* · 0.08mm/px · 29 acquisitions, 13 frames shown]
[im 1/29]
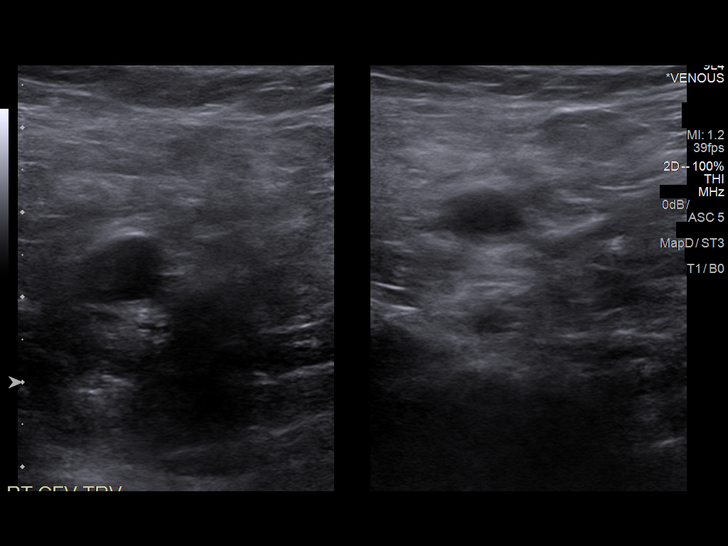
[im 3/29]
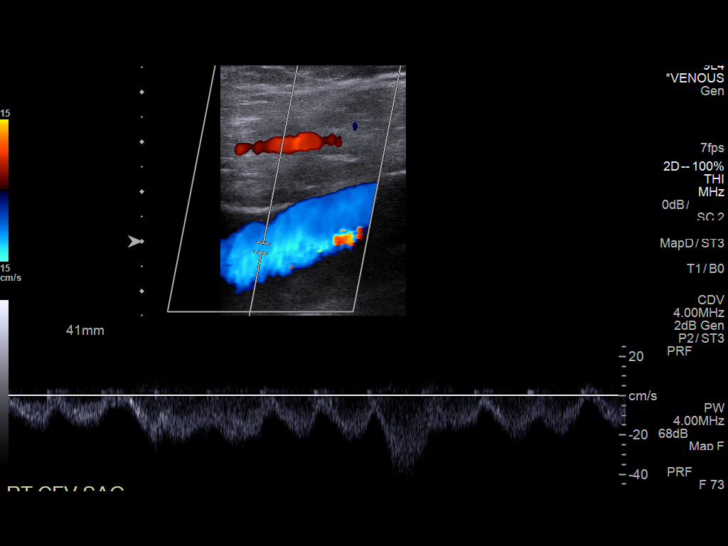
[im 5/29]
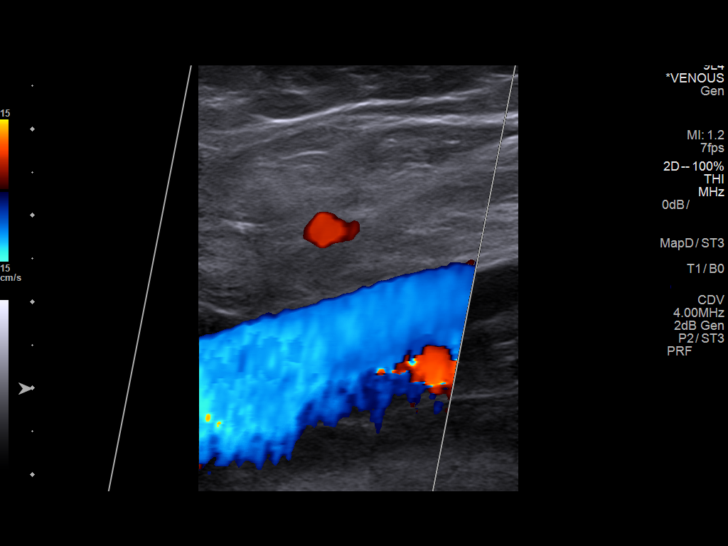
[im 8/29]
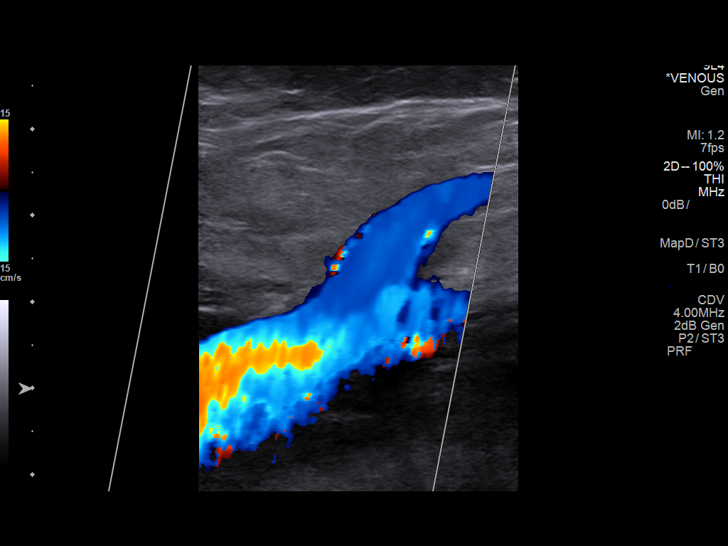
[im 10/29]
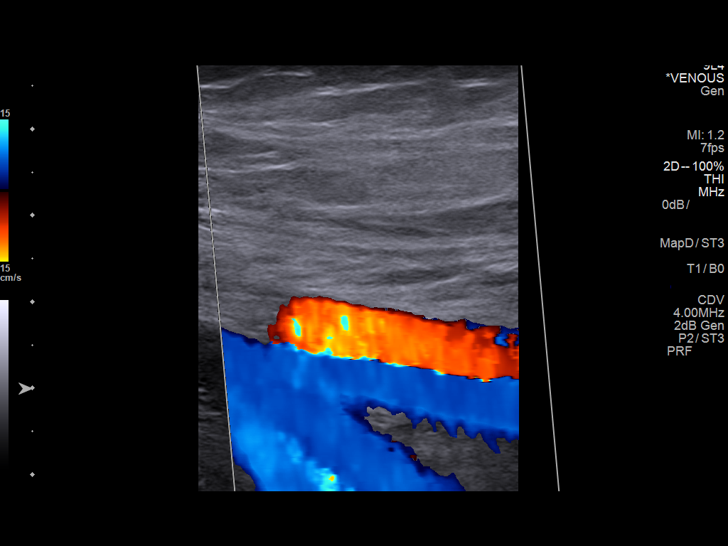
[im 13/29]
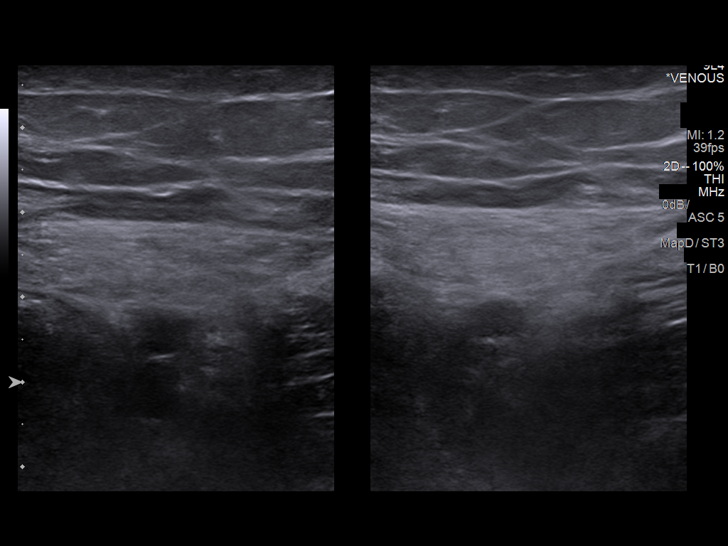
[im 16/29]
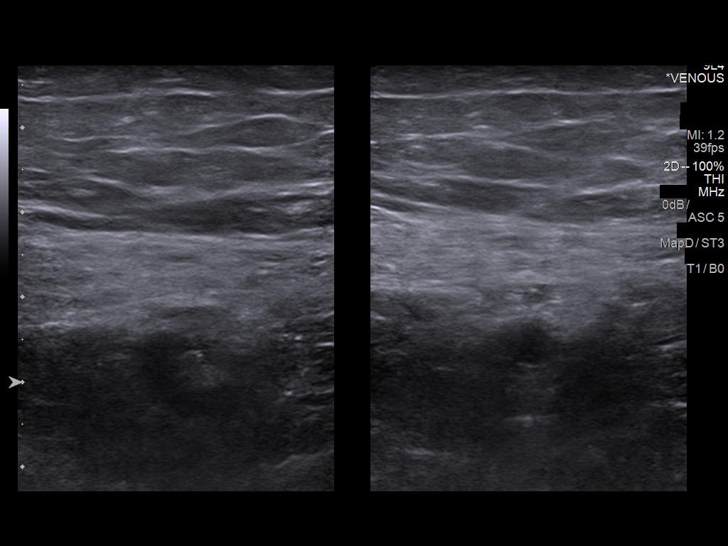
[im 18/29]
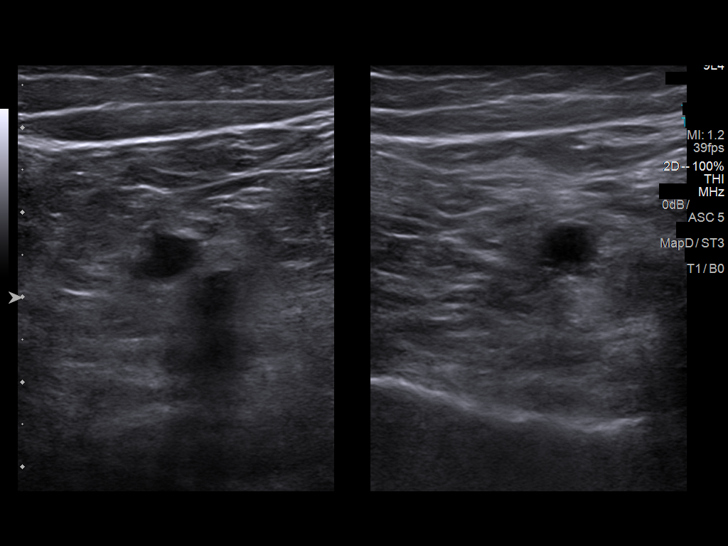
[im 20/29]
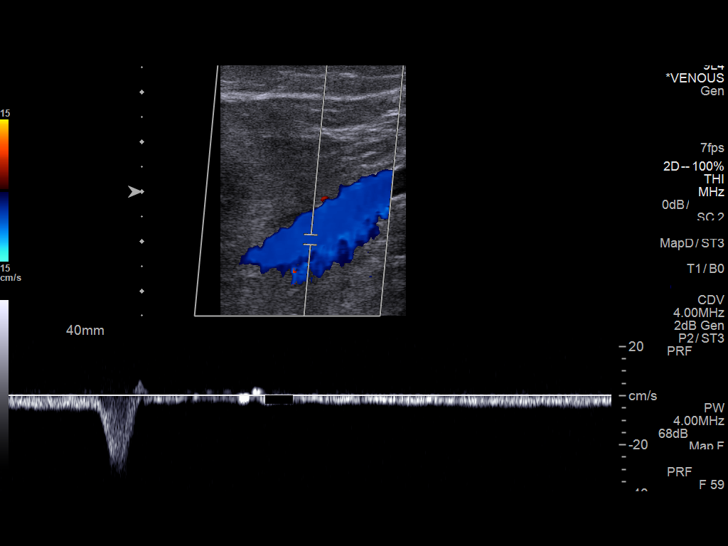
[im 22/29]
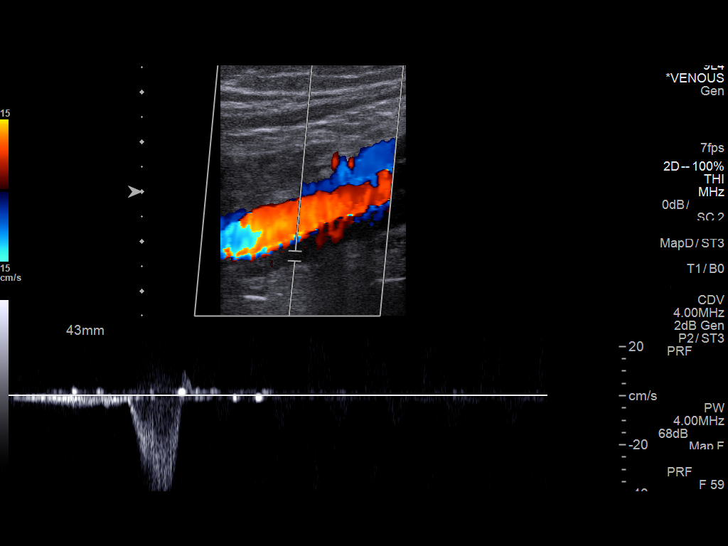
[im 25/29]
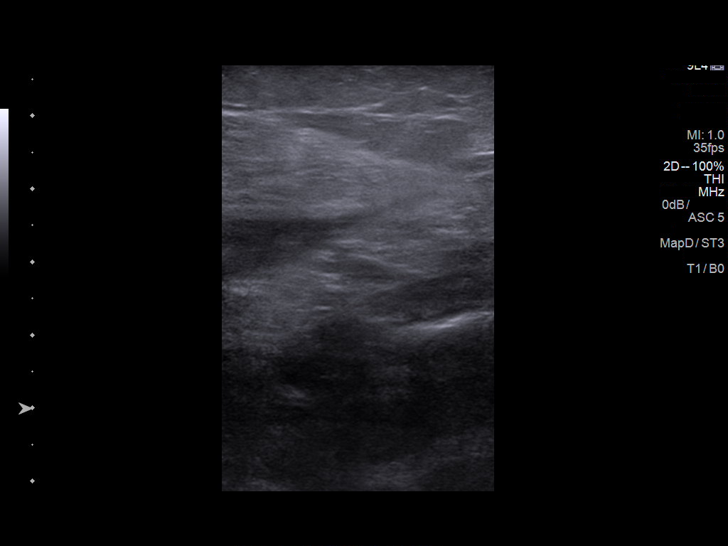
[im 26/29]
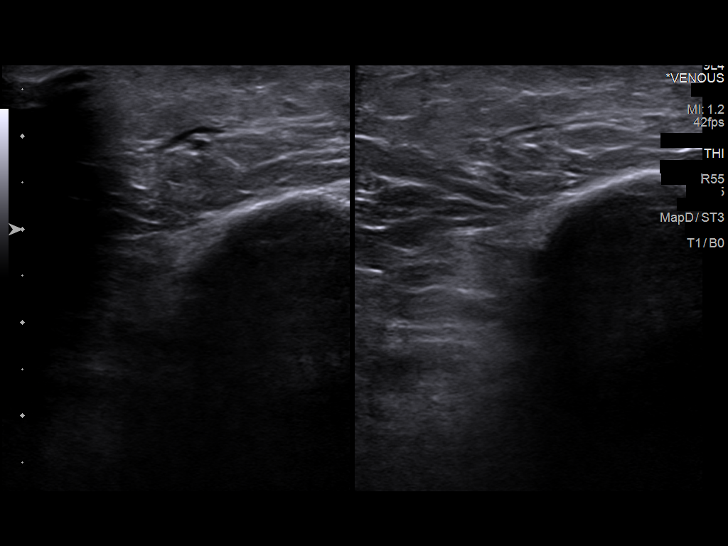
[im 29/29]
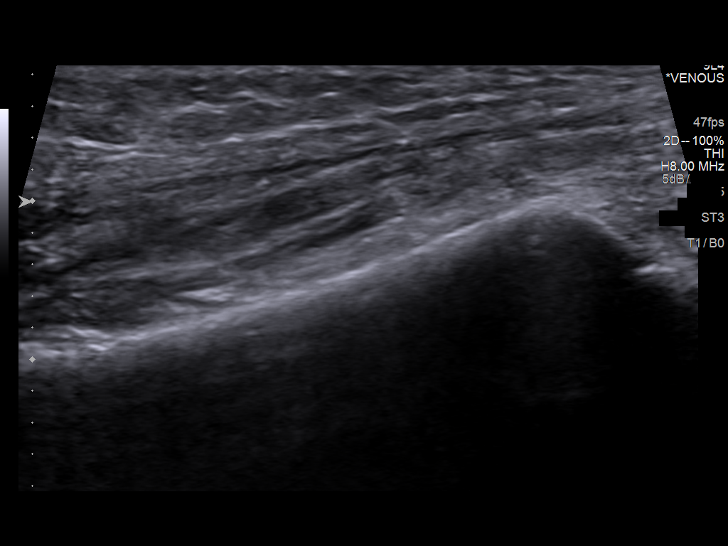

[13 of 24 positions shown; findings below may reference images not displayed]

FINDINGS: Contralateral Common Femoral Vein: Respiratory phasicity is normal
and symmetric with the symptomatic side. No evidence of thrombus.
Normal compressibility.

Common Femoral Vein: No evidence of thrombus. Normal
compressibility, respiratory phasicity and response to augmentation.

Saphenofemoral Junction: No evidence of thrombus. Normal
compressibility and flow on color Doppler imaging.

Profunda Femoral Vein: No evidence of thrombus. Normal
compressibility and flow on color Doppler imaging.

Femoral Vein: No evidence of thrombus. Normal compressibility,
respiratory phasicity and response to augmentation.

Popliteal Vein: No evidence of thrombus. Normal compressibility,
respiratory phasicity and response to augmentation.

Calf Veins: No evidence of thrombus. Normal compressibility and flow
on color Doppler imaging.

Superficial Great Saphenous Vein: No evidence of thrombus. Normal
compressibility and flow on color Doppler imaging.

Other Findings:  None.
IMPRESSION: Sonographic survey of the left lower extremity negative for DVT.

## 2017-09-07 DIAGNOSIS — R609 Edema, unspecified: Secondary | ICD-10-CM | POA: Diagnosis not present

## 2017-09-07 DIAGNOSIS — E559 Vitamin D deficiency, unspecified: Secondary | ICD-10-CM | POA: Diagnosis not present

## 2017-09-07 DIAGNOSIS — Z79899 Other long term (current) drug therapy: Secondary | ICD-10-CM | POA: Diagnosis not present

## 2017-09-07 DIAGNOSIS — D510 Vitamin B12 deficiency anemia due to intrinsic factor deficiency: Secondary | ICD-10-CM | POA: Diagnosis not present

## 2017-09-07 DIAGNOSIS — I10 Essential (primary) hypertension: Secondary | ICD-10-CM | POA: Diagnosis not present

## 2017-09-07 DIAGNOSIS — A480 Gas gangrene: Secondary | ICD-10-CM | POA: Diagnosis not present

## 2017-10-24 ENCOUNTER — Other Ambulatory Visit: Payer: Self-pay

## 2017-10-24 ENCOUNTER — Encounter: Payer: Medicare Other | Attending: Physical Medicine & Rehabilitation | Admitting: Physical Medicine & Rehabilitation

## 2017-10-24 ENCOUNTER — Encounter: Payer: Self-pay | Admitting: Physical Medicine & Rehabilitation

## 2017-10-24 VITALS — BP 149/78 | HR 63 | Ht 63.0 in | Wt 172.2 lb

## 2017-10-24 DIAGNOSIS — D333 Benign neoplasm of cranial nerves: Secondary | ICD-10-CM | POA: Insufficient documentation

## 2017-10-24 DIAGNOSIS — G8929 Other chronic pain: Secondary | ICD-10-CM | POA: Insufficient documentation

## 2017-10-24 DIAGNOSIS — R0602 Shortness of breath: Secondary | ICD-10-CM | POA: Diagnosis not present

## 2017-10-24 DIAGNOSIS — G500 Trigeminal neuralgia: Secondary | ICD-10-CM

## 2017-10-24 MED ORDER — METHADONE HCL 10 MG PO TABS: 10.0000 mg | ORAL_TABLET | Freq: Every day | ORAL | 0 refills | Status: DC

## 2017-10-24 MED ORDER — TAPENTADOL HCL 50 MG PO TABS: 50.0000 mg | ORAL_TABLET | Freq: Two times a day (BID) | ORAL | 0 refills | Status: DC | PRN

## 2017-10-24 NOTE — Progress Notes (Signed)
Subjective:    Patient ID: [name], female    DOB: 1945/09/10, 72 y.o.   MRN: [id number]  HPI   [name] is here in follow up of her chronic pain and gait dysfunction. She has been doing fairly well although she still has fatigue and swelling in her legs. She admits to not exercising like she should. She saw her family doctor who didn't make any recommendations. Her brother recently died unexpectedly of heart disease.   She continues on methadone for pain relief daily with nucynta prn at nights when pain is more severe. Pain levels are acceptable.   Pain Inventory Average Pain 7 Pain Right Now 3 My pain is constant, burning and tingling  In the last 24 hours, has pain interfered with the following? General activity 7 Relation with others 9 Enjoyment of life 9 What TIME of day is your pain at its worst? daytime Sleep (in general) Good  Pain is worse with: talking Pain improves with: rest, heat/ice and medication Relief from Meds: 8  Mobility walk without assistance how many minutes can you walk? n/a ability to climb steps?  yes do you drive?  yes  Function employed # of hrs/week 25 disabled: date disabled 2005 retired I need assistance with the following:  household duties and shopping  Neuro/Psych weakness dizziness anxiety loss of taste or smell  Prior Studies Any changes since last visit?  no CT/MRI  Physicians involved in your care Any changes since last visit?  no [facility]   Family History  Problem Relation Age of Onset  . Hypertension Mother   . Transient ischemic attack Mother   . Coronary artery disease Father        CHF  . Coronary artery disease Sister   . Macular degeneration Sister   . Transient ischemic attack Maternal Grandmother   . Diabetes Neg Hx   . Cancer Neg Hx    Social History   Socioeconomic History  . Marital status: Married    Spouse name: Not on file  . Number of children: 1   . Years of education: Not on file  . Highest education level: Not on file  Occupational History  . Occupation: Retired  Scientific laboratory technician  . Financial resource strain: Not on file  . Food insecurity:    Worry: Not on file    Inability: Not on file  . Transportation needs:    Medical: Not on file    Non-medical: Not on file  Tobacco Use  . Smoking status: Never Smoker  . Smokeless tobacco: Never Used  Substance and Sexual Activity  . Alcohol use: No    Alcohol/week: 0.0 standard drinks  . Drug use: No  . Sexual activity: Not on file  Lifestyle  . Physical activity:    Days per week: Not on file    Minutes per session: Not on file  . Stress: Not on file  Relationships  . Social connections:    Talks on phone: Not on file    Gets together: Not on file    Attends religious service: Not on file    Active member of club or organization: Not on file    Attends meetings of clubs or organizations: Not on file    Relationship status: Not on file  Other Topics Concern  . Not on file  Social History Narrative  . Not on file   Past Surgical History:  Procedure Laterality Date  . ABDOMINAL HYSTERECTOMY    .  [place]   closed hole in heart  . CATARACT EXTRACTION Left 02/02/2016  . LUMBAR DISC SURGERY     Dr Eddie Dibbles  . no colonoscopy     "scared"; SOC reviewed  . 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 Abnormality of gait   . Brachial neuritis or radiculitis NOS   . Cervicalgia   . Facial nerve disorder   . Hearing loss    Left ear  . 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 Hyperlipidemia   . Hypertension   . Macular degeneration   . Myofascial pain   . Pneumonia 2005  . Thyroid disease    hyprothyroidism  . Trigeminal neuralgia    BP (!) 149/78   Pulse 63   Ht 5\' 3"  (1.6 m)   Wt 172 lb 3.2 oz (78.1 kg)   SpO2 97%   BMI 30.50 kg/m   Opioid Risk Score:   Fall  Risk Score:  `1  Depression screen PHQ 2/9  Depression screen [facility] 2/9 10/24/2017 06/20/2017 02/23/2017 12/29/2016 02/04/2016 05/22/2014 03/07/2014  Decreased Interest 1 0 0 0 0 1 0  Down, Depressed, Hopeless 1 0 0 0 0 0 0  PHQ - 2 Score 2 0 0 0 0 1 0  Altered sleeping - - - - - 1 -  Tired, decreased energy - - - - - 2 -  Change in appetite - - - - - 0 -  Feeling bad or failure about yourself  - - - - - 0 -  Trouble concentrating - - - - - 1 -  Moving slowly or fidgety/restless - - - - - 0 -  Suicidal thoughts - - - - - 0 -  PHQ-9 Score - - - - - 5 -    Review of Systems  Constitutional: Positive for unexpected weight change.  HENT: Negative.   Eyes: Negative.   Respiratory: Negative.   Cardiovascular: Negative.   Gastrointestinal: Positive for constipation.  Endocrine: Negative.   Genitourinary: Positive for dysuria.  Musculoskeletal: Negative.   Skin: Negative.   Allergic/Immunologic: Negative.   Neurological: Negative.   Hematological: Negative.   Psychiatric/Behavioral: Negative.   All other systems reviewed and are negative.      Objective:   Physical Exam Constitutional: No distress . Vital signs reviewed. HEENT: EOMI, oral membranes moist Neck: supple Cardiovascular: RRR without murmur. No JVD    Respiratory: CTA Bilaterally without wheezes or rales. Normal effort    GI: BS +, non-tender, non-distended  Musculoskeletal: Normal range of motion. No pain with right shoulder IR/ER Neurological: She is alert and oriented to person, place, and time. Left lid lag and facial droop. Balance is fair to good. Psychiatric:pleasant as always   Assessment & Plan:  ASSESSMENT:  1. History of vestibular schwannoma resulting in trigeminal neuralgia and left facial nerve injury- no tumor regrowth per MRI--has chronic peri-operative scarring and atrophy which accounts for weakness, pain, and balance issues.  -no neuro changes, still dealing with chronic  issues. 2. History of cervicalgia and lumbar spine surgery---recent low back strain? 3. Chronic pain related to CN 5 involvement  4. Hypothyroid on supplementation  5. Right shoulder pain==resolved.  6. Reactive depression and anxiety     PLAN:  1. Methadone 10 mg daily, #30refilled. Second rx for next month provided. 2. Nucynta 50  mg 1 p.o. q.12 h. p.r.n. 30.This was refilled today We will continue the controlled substance monitoring program, this consists of regular clinic visits, examinations, routine drug screening, pill counts as well as use of [place] Controlled Substance Reporting System. [place] today.   3. Elevation of legs/TEDs for edema. Needs to increase exercise    -made referral to [facility] as she's seen them in the past for afib. Now with increased SOB/lower extremity edema over the last several months, family hx of heart disease 4.Continue HEP for balance.. 5.continue with voltaren gel, topicals. 6.Eye mgt per optho/ENT--no change 7.Follow up with me in2 months. 41minutes of face to face patient care time were spent during this visit. All questions were encouraged and answered. Encouraged to her to follow through with eye exam.

## 2017-10-24 NOTE — Patient Instructions (Signed)
PLEASE FEEL FREE TO CALL OUR OFFICE WITH ANY PROBLEMS OR QUESTIONS ([phone])      

## 2017-12-07 ENCOUNTER — Ambulatory Visit (INDEPENDENT_AMBULATORY_CARE_PROVIDER_SITE_OTHER): Payer: Medicare Other | Admitting: Interventional Cardiology

## 2017-12-07 ENCOUNTER — Encounter: Payer: Self-pay | Admitting: Interventional Cardiology

## 2017-12-07 VITALS — BP 184/80 | HR 104 | Ht 63.0 in | Wt 176.4 lb

## 2017-12-07 DIAGNOSIS — I10 Essential (primary) hypertension: Secondary | ICD-10-CM | POA: Diagnosis not present

## 2017-12-07 DIAGNOSIS — I5031 Acute diastolic (congestive) heart failure: Secondary | ICD-10-CM | POA: Diagnosis not present

## 2017-12-07 DIAGNOSIS — Q249 Congenital malformation of heart, unspecified: Secondary | ICD-10-CM

## 2017-12-07 DIAGNOSIS — R0600 Dyspnea, unspecified: Secondary | ICD-10-CM | POA: Diagnosis not present

## 2017-12-07 DIAGNOSIS — I471 Supraventricular tachycardia: Secondary | ICD-10-CM | POA: Diagnosis not present

## 2017-12-07 DIAGNOSIS — R600 Localized edema: Secondary | ICD-10-CM

## 2017-12-07 MED ORDER — FUROSEMIDE 40 MG PO TABS: 40.0000 mg | ORAL_TABLET | Freq: Every day | ORAL | 3 refills | Status: DC

## 2017-12-07 NOTE — Progress Notes (Signed)
Cardiology Office Note:    Date:  12/07/2017   ID:  [name], DOB [birth year], MRN [id number]  PCP:  Debbrah Alar, NP  Cardiologist:  No primary care provider on file.   Referring MD: Meredith Staggers, MD   Chief Complaint  Patient presents with  . Shortness of Breath  . Leg Swelling    History of Present Illness:    [name] is a 72 y.o. female with a hx of shortness of breath, and referred for cardiology consultation by Dr. Tamsen Roers and Dr. Terence Lux because of the development of bilateral lower extremity swelling.  The patient is a pleasant female who has a long-standing history of hypertension.  She has significant prior history of paroxysmal supraventricular tachycardia treated with ablation in 2005.  She has had no subsequent difficulty with arrhythmia.  Over the past 2 to 3 months she has noted progressive lower extremity swelling and dyspnea on exertion.  This began when losartan HCT was on recall because of contamination.  Her primary physician Dr. Tamsen Roers then placed her on metoprolol 100 mg/day.  This did not control the blood pressure but was associated with increased swelling.  That medication was discontinued and she was subsequently started on Erbe Sartain 150 mg/day.  Subsequently 12.5 mg of hydrochlorothiazide is been added.  During this timeframe she has had mild orthopnea.  She feels bloated in her abdomen.  She has not had chest pain.  Has no prior history of infarction or congestive heart failure.  She is always had left greater than right lower extremity swelling.  She has never had lower extremity DVT.  She is having some exertional fatigue.  She denies hemoptysis, syncope, weight loss, fever, chills, change in appetite.  Past Medical History:  Diagnosis Date  . Abnormality of gait   . Brachial neuritis or radiculitis NOS   . Cervicalgia   . Facial nerve disorder   . Hearing loss    Left ear  . 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 Hyperlipidemia   . Hypertension   . Macular degeneration   . Myofascial pain   . Pneumonia 2005  . Thyroid disease    hyprothyroidism  . Trigeminal neuralgia     Past Surgical History:  Procedure Laterality Date  . ABDOMINAL HYSTERECTOMY    . [place]   closed hole in heart  . CATARACT EXTRACTION Left 02/02/2016  . LUMBAR DISC SURGERY     Dr Eddie Dibbles  . no colonoscopy     "scared"; SOC reviewed  . 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; 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    Current Medications: Current Meds  Medication Sig  . BIOTIN 5000 PO Take 1 capsule by mouth daily.   . clorazepate (TRANXENE) 7.5 MG tablet Take 7.5 mg by mouth 2 (two) times daily as needed for anxiety.  . diclofenac sodium (VOLTAREN) 1 % GEL Apply 1 application topically 3 (three) times daily. To left face  . estradiol (ESTRACE) 0.5 MG tablet Take 1 tablet by mouth daily. q  . irbesartan (AVAPRO) 150 MG tablet Take 150 mg by mouth daily.  Marland Kitchen levothyroxine (SYNTHROID, LEVOTHROID) 50 MCG tablet Take 50 mcg by mouth daily before breakfast.  . methadone (DOLOPHINE) 10 MG tablet Take 1 tablet (10 mg total) by mouth daily.  . Multiple Vitamins-Minerals (PRESERVISION AREDS PO) Take 1 capsule by mouth daily.   Marland Kitchen  tapentadol (NUCYNTA ER) 50 MG 12 hr tablet Take 50 mg by mouth every 12 (twelve) hours as needed (nerve pain).  . [DISCONTINUED] hydrochlorothiazide (MICROZIDE) 12.5 MG capsule Take 12.5 mg by mouth every other day.     Allergies:   Morphine   Social History   Socioeconomic History  . Marital status: Married    Spouse name: Not on file  . Number of children: 1  . Years of education: Not on file  . Highest education level: Not on file  Occupational History  . Occupation: Retired  Scientific laboratory technician  . Financial resource strain: Not on file  . Food insecurity:    Worry: Not on file    Inability: Not on file  . Transportation needs:    Medical: Not on file      Non-medical: Not on file  Tobacco Use  . Smoking status: Never Smoker  . Smokeless tobacco: Never Used  Substance and Sexual Activity  . Alcohol use: No    Alcohol/week: 0.0 standard drinks  . Drug use: No  . Sexual activity: Not on file  Lifestyle  . Physical activity:    Days per week: Not on file    Minutes per session: Not on file  . Stress: Not on file  Relationships  . Social connections:    Talks on phone: Not on file    Gets together: Not on file    Attends religious service: Not on file    Active member of club or organization: Not on file    Attends meetings of clubs or organizations: Not on file    Relationship status: Not on file  Other Topics Concern  . Not on file  Social History Narrative  . Not on file     Family History: The patient's family history includes Coronary artery disease in her father and sister; Hypertension in her mother; Macular degeneration in her sister; Transient ischemic attack in her maternal grandmother and mother. There is no history of Diabetes or Cancer.  ROS:   Please see the history of present illness.    Prior SVT.  No palpitations have been noted.  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  She has difficulty with vision and balance.  She has chronic back pain.  All other systems reviewed and are negative.  EKGs/Labs/Other Studies Reviewed:    The following studies were reviewed today: Review of volumes of records within her chart reveals that she had an echocardiogram and other cardiac testing prior to ablation in 2005 but none since that time.  She had a lower extremity arterial Doppler performed in July 2016: Impressions No evidence of segmental lower extremity arterial disease at rest, bilaterally. Normal ABI's, bilaterally. Normal great toe-brachial indices, bilaterally  EKG:  EKG is  ordered today.  The ekg ordered today demonstrates sinus  tachycardia 104 bpm, diffuse nonspecific ST-T abnormality.  No prior tracings are available for comparison.  Recent Labs: No results found for requested labs within last 8760 hours.  Recent Lipid Panel    Component Value Date/Time   CHOL 242 (H) 08/01/2014 0807   CHOL 252 (H) 12/22/2012 0848   TRIG 164.0 (H) 08/01/2014 0807   TRIG 182 (H) 12/22/2012 0848   HDL 54.00 08/01/2014 0807   HDL 54 12/22/2012 0848   CHOLHDL 4 08/01/2014 0807   VLDL 32.8 08/01/2014 0807   LDLCALC 155 (H) 08/01/2014 0807   LDLCALC  162 (H) 12/22/2012 0848   LDLDIRECT 203.3 12/15/2012 1430    Physical Exam:    VS:  BP (!) 184/80   Pulse (!) 104   Ht '5\' 3"'  (1.6 m)   Wt 176 lb 6.4 oz (80 kg)   BMI 31.25 kg/m     Wt Readings from Last 3 Encounters:  12/07/17 176 lb 6.4 oz (80 kg)  10/24/17 172 lb 3.2 oz (78.1 kg)  08/23/17 166 lb (75.3 kg)     GEN: Mildly obese.  Well nourished, well developed in no acute distress HEENT: Normal NECK: Moderate JVD to the angle of the jaw with the patient lying at 45 degrees.   LYMPHATICS: No lymphadenopathy CARDIAC: RRR, soft 1/6 right upper sternal border systolic murmur, no obvious gallop is heard gallop, 2+ bilateral ankle to upper shin pitting edema left greater than right.  No hand or abdominal wall edema. VASCULAR: 2+ bilateral radial and carotid bilateral pulses.  No bruits. RESPIRATORY:  Clear to auscultation without rales, wheezing or rhonchi  ABDOMEN: Soft, non-tender, non-distended, No pulsatile mass, MUSCULOSKELETAL: No deformity  SKIN: Warm and dry NEUROLOGIC:  Alert and oriented x 3 PSYCHIATRIC:  Normal affect   ASSESSMENT:    1. Acute diastolic heart failure ([place])   2. Bilateral lower extremity edema   3. Dyspnea, unspecified type   4. Essential hypertension   5. Congenital heart disease   6. Paroxysmal SVT (supraventricular tachycardia) ([facility])    PLAN:    In order of problems listed above:  1. Suspect that bilateral lower extremity swelling  and dyspnea are related to acute diastolic heart failure.  This was likely initially precipitated by withdrawal of diuretic therapy when there was a recall of losartan HCT.  Initially a significant dose of metoprolol was started.  Since this time in the early summer she has had progressive lower extremity swelling despite more recently being back on ARB and HCTZ therapy.  We need to exclude significant systolic dysfunction and assess RV since she has a history of congenital heart disease (see below).  2D Doppler echocardiogram to assess LV function. 2. Significant lower extremity edema probably multifactorial in the setting of withdrawal of diuretic therapy and likely diastolic dysfunction. 3. We will check a BNP and hemoglobin to determine if there is evidence of "heart failure".  2D Doppler echocardiogram. 4. Blood pressure is extremely elevated.  We will start a more potent diuretic in the form of furosemide 40 mg daily.  Stop hydrochlorothiazide.  Blood pressure should improve with volume removal.  Further up titration of antihypertensive therapy may be necessary if it remains above 140/90 mmHg on return in 2 weeks.  She will either be seen by me or an APP at that time.  Basic metabolic panel needs to be done in 1 week. 5. History of congenital heart disease with prior history of a hole in the heart. 6. Had ablation for PSVT in 2005 without recurrent issues since that time.  She has volume overload.  Probably has acute diastolic on chronic diastolic heart failure.  We are intensifying fluid removal by adding furosemide and stopping hydrochlorothiazide.  Bmet in 1 week.  Clinical follow-up in 7 to 14 days.  Call if dizzy, lightheadedness, or other problems.  Greater than 50% of the time during this office visit was spent in education, counseling, and coordination of care related to underlying disease process and testing as outlined.    Medication Adjustments/Labs and Tests Ordered: Current medicines  are reviewed at length  with the patient today.  Concerns regarding medicines are outlined above.  Orders Placed This Encounter  Procedures  . Pro b natriuretic peptide  . Comp Met (CMET)  . TSH  . EKG 12-Lead  . ECHOCARDIOGRAM COMPLETE   Meds ordered this encounter  Medications  . furosemide (LASIX) 40 MG tablet    Sig: Take 1 tablet (40 mg total) by mouth daily.    Dispense:  90 tablet    Refill:  3    Patient Instructions  Your physician has recommended you make the following change in your medication:  STOP ALEVE STOP HCTZ START FUROSEMIDE 40 MG EVERY DAY   Your physician recommends that you return for lab work in:  TODAY  BNP, [place] [facility]  Your physician has requested that you have an echocardiogram. Echocardiography is a painless test that uses sound waves to create images of your heart. It provides your doctor with information about the size and shape of your heart and how well your heart's chambers and valves are working. This procedure takes approximately one hour. There are no restrictions for this procedure.   Your physician recommends that you schedule a follow-up appointment in:  7 -[place]     Signed, Sinclair Grooms, MD  12/07/2017 2:44 PM    [place]

## 2017-12-07 NOTE — Patient Instructions (Addendum)
Your physician has recommended you make the following change in your medication:  STOP ALEVE STOP HCTZ START FUROSEMIDE 40 MG EVERY DAY   Your physician recommends that you return for lab work in:  TODAY  BNP, [place] [facility]  Your physician has requested that you have an echocardiogram. Echocardiography is a painless test that uses sound waves to create images of your heart. It provides your doctor with information about the size and shape of your heart and how well your heart's chambers and valves are working. This procedure takes approximately one hour. There are no restrictions for this procedure.   Your physician recommends that you schedule a follow-up appointment in:  7 -[name]

## 2017-12-08 LAB — COMPREHENSIVE METABOLIC PANEL
A/G RATIO: 2.3 — AB (ref 1.2–2.2)
ALT: 12 IU/L (ref 0–32)
AST: 13 IU/L (ref 0–40)
Albumin: 4.3 g/dL (ref 3.5–4.8)
Alkaline Phosphatase: 85 IU/L (ref 39–117)
BUN/Creatinine Ratio: 18 (ref 12–28)
BUN: 19 mg/dL (ref 8–27)
Bilirubin Total: 0.3 mg/dL (ref 0.0–1.2)
CALCIUM: 9.8 mg/dL (ref 8.7–10.3)
CO2: 25 mmol/L (ref 20–29)
CREATININE: 1.08 mg/dL — AB (ref 0.57–1.00)
Chloride: 107 mmol/L — ABNORMAL HIGH (ref 96–106)
GFR, EST AFRICAN AMERICAN: 59 mL/min/{1.73_m2} — AB (ref >59)
GFR, EST NON AFRICAN AMERICAN: 51 mL/min/{1.73_m2} — AB (ref >59)
GLUCOSE: 115 mg/dL — AB (ref 65–99)
Globulin, Total: 1.9 g/dL (ref 1.5–4.5)
Potassium: 4.7 mmol/L (ref 3.5–5.2)
SODIUM: 145 mmol/L — AB (ref 134–144)
TOTAL PROTEIN: 6.2 g/dL (ref 6.0–8.5)

## 2017-12-08 LAB — TSH: TSH: 6.51 u[IU]/mL — ABNORMAL HIGH (ref 0.450–4.500)

## 2017-12-08 LAB — PRO B NATRIURETIC PEPTIDE: NT-PRO BNP: 507 pg/mL — AB (ref 0–301)

## 2017-12-14 ENCOUNTER — Ambulatory Visit (HOSPITAL_COMMUNITY): Payer: Medicare Other | Attending: Cardiovascular Disease

## 2017-12-14 ENCOUNTER — Other Ambulatory Visit: Payer: Self-pay

## 2017-12-14 ENCOUNTER — Telehealth: Payer: Self-pay | Admitting: Family

## 2017-12-14 DIAGNOSIS — I5031 Acute diastolic (congestive) heart failure: Secondary | ICD-10-CM | POA: Diagnosis not present

## 2017-12-14 NOTE — Telephone Encounter (Signed)
Lab shows that thyroid medication needs adjustment. Please schedule her follow up with me to address. I have not seen her since 2017

## 2017-12-15 NOTE — Telephone Encounter (Signed)
Lm at home number and cell for patient to call back for appointment with Melissa.

## 2017-12-19 NOTE — Telephone Encounter (Signed)
Notified pt. She states she already has multiple appts this week and would need to schedule appt for the following week preferably on a Friday morning. Appt scheduled for 12/30/17 at 8:40am.

## 2017-12-19 NOTE — Progress Notes (Signed)
CARDIOLOGY OFFICE NOTE  Date:  12/20/2017    [name] Date of Birth: 03-24-1945 Medical Record #[id number]  PCP:  Traci Alar, NP  Cardiologist:  [name]   Chief Complaint  Patient presents with  . Edema  . Hypertension    Follow up visit -seen for Dr. Tamala Wong    History of Present Illness: [name] is a 72 y.o. female who presents today for a 2 week check. Seen for Dr. Tamala Wong.   She has a history of SOB - was referred here last month by PCP for swelling and dyspnea. She has had long standing HTN, paroxysmal supraventricular tachycardia treated with ablation in 2005 and reported congenital heart disease with "being a blue baby and had valve surgery as a child" with what sounds like thru a thoracotomy. Prior brain tumor with subsequent nerve pain and left sided facial numbness - requiring Methadone.     Seen last month by Dr. Tamala Wong - she had had multiple med changes prior - he was concerned for heart failure - Lasix was started and echo was obtained.   Comes in today. Here alone. She has lost 7 pounds. Her breathing has improved but not resolved. Swelling has improved but not resolved. She is on her feet most of the day. She does work some - on days that she works she only takes 1/2 dose and will take the other later in the evening due to urinary frequency.  No real palpitations. She does not use support stockings. Probably getting too much salt - likes [employer] and eats out frequently. Her TSH level is elevated - she has not had her thyroid dose adjusted up yet but has been in contact with her PCP - sees them next week. No chest pain. She does have constant nerve pain.   Past Medical History:  Diagnosis Date  . Abnormality of gait   . Brachial neuritis or radiculitis NOS   . Cervicalgia   . Facial nerve disorder   . Hearing loss    Left ear  . 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 Hyperlipidemia   . Hypertension   . Macular  degeneration   . Myofascial pain   . Pneumonia 2005  . Thyroid disease    hyprothyroidism  . Trigeminal neuralgia     Past Surgical History:  Procedure Laterality Date  . ABDOMINAL HYSTERECTOMY    . [place]   closed hole in heart  . CATARACT EXTRACTION Left 02/02/2016  . LUMBAR DISC SURGERY     Dr [name]  . no colonoscopy     "scared"; SOC reviewed  . 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; 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     Medications: Current Meds  Medication Sig  . BIOTIN 5000 PO Take 1 capsule by mouth daily.   . clorazepate (TRANXENE) 7.5 MG tablet Take 7.5 mg by mouth 2 (two) times daily as needed for anxiety.  . diclofenac sodium (VOLTAREN) 1 % GEL Apply 1 application topically 3 (three) times daily. To left face  . estradiol (ESTRACE) 0.5 MG tablet Take 1 tablet by mouth daily. q  . furosemide (LASIX) 40 MG tablet Take 1 tablet (40 mg total) by mouth daily.  . irbesartan (AVAPRO) 150 MG tablet Take 150 mg by mouth daily.  Marland Kitchen levothyroxine (SYNTHROID, LEVOTHROID) 50 MCG tablet Take 50 mcg by mouth daily before breakfast.  . methadone (  DOLOPHINE) 10 MG tablet Take 1 tablet (10 mg total) by mouth daily.  . Multiple Vitamins-Minerals (PRESERVISION AREDS PO) Take 1 capsule by mouth daily.   . tapentadol (NUCYNTA ER) 50 MG 12 hr tablet Take 50 mg by mouth every 12 (twelve) hours as needed (nerve pain).     Allergies: Allergies  Allergen Reactions  . Morphine     REACTION: GI upset    Social History: The patient  reports that she has never smoked. She has never used smokeless tobacco. She reports that she does not drink alcohol or use drugs.   Family History: The patient's family history includes Coronary artery disease in her father and sister; Hypertension in her mother; Macular degeneration in her sister; Transient ischemic attack in her maternal grandmother and mother.   Review of Systems: Please see the history of present illness.   Otherwise,  the review of systems is positive for none.   All other systems are reviewed and negative.   Physical Exam: VS:  BP (!) 150/78 (BP Location: Left Arm, Patient Position: Sitting, Cuff Size: Normal)   Pulse 78   Ht 5\' 3"  (1.6 m)   Wt 169 lb 12.8 oz (77 kg)   SpO2 98%   BMI 30.08 kg/m  .  BMI Body mass index is 30.08 kg/m.  Wt Readings from Last 3 Encounters:  12/20/17 169 lb 12.8 oz (77 kg)  12/07/17 176 lb 6.4 oz (80 kg)  10/24/17 172 lb 3.2 oz (78.1 kg)   BP recheck by me is 150/100.   General: Pleasant. Well developed, well nourished and in no acute distress.   HEENT: Normal. Left facial paralysis noted.  Neck: Supple, no JVD, carotid bruits, or masses noted.  Cardiac: Regular rate and rhythm. No murmurs, rubs, or gallops. No edema.  Respiratory:  Lungs are clear to auscultation bilaterally with normal work of breathing.  GI: Soft and nontender.  MS: No deformity or atrophy. Gait and ROM intact.  Skin: Warm and dry. Color is normal.  Neuro:  Strength and sensation are intact and no gross focal deficits noted.  Psych: Alert, appropriate and with normal affect.   LABORATORY DATA:  EKG:  EKG is not ordered today.  Lab Results  Component Value Date   WBC 7.9 03/20/2014   HGB 13.8 03/20/2014   HCT 40.9 03/20/2014   PLT 279.0 03/20/2014   GLUCOSE 115 (H) 12/07/2017   CHOL 242 (H) 08/01/2014   TRIG 164.0 (H) 08/01/2014   HDL 54.00 08/01/2014   LDLDIRECT 203.3 12/15/2012   LDLCALC 155 (H) 08/01/2014   ALT 12 12/07/2017   AST 13 12/07/2017   NA 145 (H) 12/07/2017   K 4.7 12/07/2017   CL 107 (H) 12/07/2017   CREATININE 1.08 (H) 12/07/2017   BUN 19 12/07/2017   CO2 25 12/07/2017   TSH 6.510 (H) 12/07/2017   HGBA1C 5.9 03/21/2014     BNP (last 3 results) No results for input(s): BNP in the last 8760 hours.  ProBNP (last 3 results) Recent Labs    12/07/17 1247  PROBNP 507*     Other Studies Reviewed Today:  Echo Study Conclusions 11/2017  - Left  ventricle: The cavity size was normal. Systolic function was   normal. The estimated ejection fraction was in the range of 60%   to 65%. Wall motion was normal; there were no regional wall   motion abnormalities. Left ventricular diastolic function   parameters were normal. - Mitral valve: Moderately calcified annulus. Moderately  thickened,   moderately calcified leaflets . - Pulmonary arteries: PA peak pressure: 31 mm Hg (S).   Assessment/Plan:  1. Dyspnea/swelling - improved - her echo looks basically normal - she does get too much salt - BP is not controlled. [place] lab today. Salt restriction is imperative. Advised knee high support stockings. Will continue the Lasix for now. Sending for CXR as well today.   2. HTN - Avapro is increased today to 300 mg a day - BMET today.   3. PSVT - resolved.   4. Reported congenital heart disease - her echo is basically normal.   5. Prior brain tumor with subsequent nerve damage - chronic pain syndrome   Current medicines are reviewed with the patient today.  The patient does not have concerns regarding medicines other than what has been noted above.  The following changes have been made:  See above.  Labs/ tests ordered today include:    Orders Placed This Encounter  Procedures  . DG Chest 2 View  . Basic metabolic panel     Disposition:   FU with Dr. Tamala Wong in 6 months.    Patient is agreeable to this plan and will call if any problems develop in the interim.   SignedTruitt Merle, NP  12/20/2017 10:21 AM  [facility] [address] [place], [place]  [address] Phone: [phone] Fax: [phone]

## 2017-12-20 ENCOUNTER — Other Ambulatory Visit: Payer: Self-pay | Admitting: Nurse Practitioner

## 2017-12-20 ENCOUNTER — Ambulatory Visit (INDEPENDENT_AMBULATORY_CARE_PROVIDER_SITE_OTHER): Payer: Medicare Other | Admitting: Nurse Practitioner

## 2017-12-20 ENCOUNTER — Encounter: Payer: Self-pay | Admitting: Nurse Practitioner

## 2017-12-20 ENCOUNTER — Telehealth: Payer: Self-pay | Admitting: Nurse Practitioner

## 2017-12-20 ENCOUNTER — Ambulatory Visit
Admission: RE | Admit: 2017-12-20 | Discharge: 2017-12-20 | Disposition: A | Discharge status: Home / Self Care | Payer: Medicare Other | Source: Ambulatory Visit | Attending: Nurse Practitioner | Admitting: Nurse Practitioner

## 2017-12-20 VITALS — BP 150/78 | HR 78 | Ht 63.0 in | Wt 169.8 lb

## 2017-12-20 DIAGNOSIS — R0600 Dyspnea, unspecified: Secondary | ICD-10-CM | POA: Diagnosis not present

## 2017-12-20 DIAGNOSIS — R600 Localized edema: Secondary | ICD-10-CM

## 2017-12-20 DIAGNOSIS — I10 Essential (primary) hypertension: Secondary | ICD-10-CM

## 2017-12-20 DIAGNOSIS — J189 Pneumonia, unspecified organism: Secondary | ICD-10-CM

## 2017-12-20 DIAGNOSIS — J181 Lobar pneumonia, unspecified organism: Principal | ICD-10-CM

## 2017-12-20 LAB — BASIC METABOLIC PANEL
BUN/Creatinine Ratio: 22 (ref 12–28)
BUN: 21 mg/dL (ref 8–27)
CO2: 26 mmol/L (ref 20–29)
Calcium: 10.3 mg/dL (ref 8.7–10.3)
Chloride: 102 mmol/L (ref 96–106)
Creatinine, Ser: 0.97 mg/dL (ref 0.57–1.00)
GFR calc Af Amer: 67 mL/min/{1.73_m2} (ref >59)
GFR calc non Af Amer: 58 mL/min/{1.73_m2} — ABNORMAL LOW (ref >59)
Glucose: 116 mg/dL — ABNORMAL HIGH (ref 65–99)
Potassium: 4.6 mmol/L (ref 3.5–5.2)
Sodium: 144 mmol/L (ref 134–144)

## 2017-12-20 MED ORDER — DOXYCYCLINE HYCLATE 100 MG PO CAPS: 100.0000 mg | ORAL_CAPSULE | Freq: Two times a day (BID) | ORAL | 0 refills | Status: DC

## 2017-12-20 MED ORDER — IRBESARTAN 300 MG PO TABS: 300.0000 mg | ORAL_TABLET | Freq: Every day | ORAL | 3 refills | Status: DC

## 2017-12-20 NOTE — Patient Instructions (Addendum)
We will be checking the following labs today - BMET  Please go to [facility] to [place] on the first floor for a chest Xray - you may walk in.    If you have labs (blood work) drawn today and your tests are completely normal, you will receive your results only by: Marland Kitchen MyChart Message (if you have MyChart) OR . A paper copy in the mail If you have any lab test that is abnormal or we need to change your treatment, we will call you to review the results.   Medication Instructions:    Continue with your current medicines. BUT  I am increasing the Avapro to 300 mg a day - this will be at the drug store.     If you need a refill on your cardiac medications before your next appointment, please call your pharmacy.     Testing/Procedures To Be Arranged:  N/A  Follow-Up:   See me in about a month - bring your BP cuff when you see me next or your PCP    At [facility], you and your health needs are our priority.  As part of our continuing mission to provide you with exceptional heart care, we have created designated Provider Care Teams.  These Care Teams include your primary Cardiologist (physician) and Advanced Practice Providers (APPs -  Physician Assistants and Nurse Practitioners) who all work together to provide you with the care you need, when you need it.  Special Instructions:  . Try to restrict your salt . Knee high compression stockings  Call the [place] office at [phone] if you have any questions, problems or concerns.

## 2017-12-20 NOTE — Telephone Encounter (Signed)
Follow up ° °Pt returning call for nurse °

## 2017-12-21 ENCOUNTER — Encounter: Payer: Medicare Other | Attending: Physical Medicine & Rehabilitation | Admitting: Physical Medicine & Rehabilitation

## 2017-12-21 ENCOUNTER — Encounter: Payer: Medicare Other | Admitting: Physical Medicine & Rehabilitation

## 2017-12-21 ENCOUNTER — Encounter: Payer: Self-pay | Admitting: Physical Medicine & Rehabilitation

## 2017-12-21 VITALS — BP 147/64 | HR 80 | Ht 63.0 in | Wt 171.6 lb

## 2017-12-21 DIAGNOSIS — G8929 Other chronic pain: Secondary | ICD-10-CM | POA: Insufficient documentation

## 2017-12-21 DIAGNOSIS — G894 Chronic pain syndrome: Secondary | ICD-10-CM | POA: Diagnosis not present

## 2017-12-21 DIAGNOSIS — G500 Trigeminal neuralgia: Secondary | ICD-10-CM | POA: Diagnosis not present

## 2017-12-21 DIAGNOSIS — D333 Benign neoplasm of cranial nerves: Secondary | ICD-10-CM | POA: Insufficient documentation

## 2017-12-21 MED ORDER — METHADONE HCL 10 MG PO TABS: 10.0000 mg | ORAL_TABLET | Freq: Every day | ORAL | 0 refills | Status: DC

## 2017-12-21 MED ORDER — TAPENTADOL HCL ER 50 MG PO TB12: 50.0000 mg | ORAL_TABLET | Freq: Two times a day (BID) | ORAL | 0 refills | Status: DC | PRN

## 2017-12-21 NOTE — Telephone Encounter (Signed)
Called pt re: Chest X Ray results. Pt has been made aware that it shows early signs of pnuemonia and that she has a rx for Doxycycline 100 mg taking bid X's 7 days at her pharmacy and she will repeat CXR in 3-4 weeks at [place]. Pt thanked me for the call.

## 2017-12-21 NOTE — Progress Notes (Signed)
Subjective:    Patient ID: [name], female    DOB: 02-25-1945, 72 y.o.   MRN: [id number]  HPI   Sherah is here in follow-up of her chronic pain syndrome.  I last saw her 2 months ago.  She has seen by cardiology for assessment of her shortness of breath and lower extremity edema.  Her echo cardiogram actually showed good ejection fraction no significant abnormalities.  She was found to be slightly hypothyroid and placed on increased supplement.  She also was found to have a pneumonia yesterday by x-ray and is going to begin antibiotics today.  From a pain standpoint she is doing fairly well.  She continues on her methadone daily as well as her Nucynta for more severe pain at nighttime.  She continues to work part-time at [employer].  She seems like her job there and tries to avoid overdoing things while she is there.  Pain Inventory Average Pain 7 Pain Right Now 7 My pain is constant, burning, stabbing and tingling  In the last 24 hours, has pain interfered with the following? General activity 8 Relation with others 9 Enjoyment of life 10 What TIME of day is your pain at its worst? evening Sleep (in general) Fair  Pain is worse with: . Pain improves with: rest, heat/ice and medication Relief from Meds: 8  Mobility walk without assistance ability to climb steps?  yes do you drive?  yes  Function retired I need assistance with the following:  household duties and shopping  Neuro/Psych trouble walking anxiety loss of taste or smell  Prior Studies Any changes since last visit?  no  Physicians involved in your care Any changes since last visit?  no   Family History  Problem Relation Age of Onset  . Hypertension Mother   . Transient ischemic attack Mother   . Coronary artery disease Father        CHF  . Coronary artery disease Sister   . Macular degeneration Sister   . Transient ischemic attack Maternal Grandmother   . Diabetes Neg Hx   . Cancer Neg Hx      Social History   Socioeconomic History  . Marital status: Married    Spouse name: Not on file  . Number of children: 1  . Years of education: Not on file  . Highest education level: Not on file  Occupational History  . Occupation: Retired  Scientific laboratory technician  . Financial resource strain: Not on file  . Food insecurity:    Worry: Not on file    Inability: Not on file  . Transportation needs:    Medical: Not on file    Non-medical: Not on file  Tobacco Use  . Smoking status: Never Smoker  . Smokeless tobacco: Never Used  Substance and Sexual Activity  . Alcohol use: No    Alcohol/week: 0.0 standard drinks  . Drug use: No  . Sexual activity: Not on file  Lifestyle  . Physical activity:    Days per week: Not on file    Minutes per session: Not on file  . Stress: Not on file  Relationships  . Social connections:    Talks on phone: Not on file    Gets together: Not on file    Attends religious service: Not on file    Active member of club or organization: Not on file    Attends meetings of clubs or organizations: Not on file    Relationship status: Not on  file  Other Topics Concern  . Not on file  Social History Narrative  . Not on file   Past Surgical History:  Procedure Laterality Date  . ABDOMINAL HYSTERECTOMY    . [place]   closed hole in heart  . CATARACT EXTRACTION Left 02/02/2016  . LUMBAR DISC SURGERY     Dr Eddie Dibbles  . no colonoscopy     "scared"; SOC reviewed  . 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 Abnormality of gait   . Brachial neuritis or radiculitis NOS   . Cervicalgia   . Facial nerve disorder   . Hearing loss    Left ear  . 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 Hyperlipidemia   . Hypertension   . Macular degeneration   . Myofascial pain   . Pneumonia 2005  . Thyroid disease    hyprothyroidism  . Trigeminal neuralgia    There  were no vitals taken for this visit.  Opioid Risk Score:   Fall Risk Score:  `1  Depression screen PHQ 2/9  Depression screen [facility] 2/9 10/24/2017 06/20/2017 02/23/2017 12/29/2016 02/04/2016 05/22/2014 03/07/2014  Decreased Interest 1 0 0 0 0 1 0  Down, Depressed, Hopeless 1 0 0 0 0 0 0  PHQ - 2 Score 2 0 0 0 0 1 0  Altered sleeping - - - - - 1 -  Tired, decreased energy - - - - - 2 -  Change in appetite - - - - - 0 -  Feeling bad or failure about yourself  - - - - - 0 -  Trouble concentrating - - - - - 1 -  Moving slowly or fidgety/restless - - - - - 0 -  Suicidal thoughts - - - - - 0 -  PHQ-9 Score - - - - - 5 -    Review of Systems  Constitutional: Positive for unexpected weight change.  HENT: Negative.   Eyes: Negative.   Respiratory: Positive for shortness of breath.   Cardiovascular: Negative.   Gastrointestinal: Positive for constipation.  Endocrine: Negative.   Genitourinary: Positive for difficulty urinating.  Musculoskeletal: Positive for joint swelling.  Skin: Negative.   Allergic/Immunologic: Negative.   Neurological: Positive for facial asymmetry.  Hematological: Negative.   Psychiatric/Behavioral: Negative.   All other systems reviewed and are negative.      Objective:   Physical Exam  General: No acute distress HEENT: EOMI, oral membranes moist Cards: reg rate  Chest: normal effort Abdomen: Soft, NT, ND Skin: dry, intact Extremities: no edema in LE's Musculoskeletal: Normal range of motion. No pain with right shoulder IR/ER Neurological: She is alert and oriented to person, place, and time. Left lid lag and facial droop---no changes Psychiatric:pleasant as always   Assessment & Plan:  ASSESSMENT:  1. History of vestibular schwannoma resulting in trigeminal neuralgia and left facial nerve injury- no tumor regrowth per MRI--has chronic peri-operative scarring and atrophy which accounts for weakness, pain, and balance issues.  -stable  at present. 2. History of cervicalgia and lumbar spine surgery---recent low back strain? 3. Chronic pain related to CN 5 involvement  4. Hypothyroid on supplementation  5. Right shoulder pain resolved.  6. Reactive depression and anxiety     PLAN:  1. Methadone 10 mg daily, #30refilled. RF today 2. Nucynta 50 mg 1 p.o. q.12  h. p.r.n. 30.RF We will continue the controlled substance monitoring program, this consists of regular clinic visits, examinations, routine drug screening, pill counts as well as use of [place] Controlled Substance Reporting System. [place].   -Medication was refilled and a second prescription was sent to the patient's pharmacy for next month.     3.SOB/pneumonia: per [employer] cards.  4. Hypothyroid: Continue supplementation.  Really needs a full thyroid battery at some point soon.. 5.continue with voltaren gel, topicals. 6.Eye mgt per optho/ENT--stable 7.Follow up with me in2 months. 47minutes of face to face patient care time were spent during this visit. All questions were encouraged and answered. Encouraged to her to follow through with eye exam.

## 2017-12-30 ENCOUNTER — Ambulatory Visit (INDEPENDENT_AMBULATORY_CARE_PROVIDER_SITE_OTHER): Payer: Medicare Other | Admitting: Family

## 2017-12-30 ENCOUNTER — Encounter: Payer: Self-pay | Admitting: Family

## 2017-12-30 VITALS — BP 159/58 | HR 85 | Temp 98.4°F | Resp 16 | Ht 63.0 in | Wt 169.8 lb

## 2017-12-30 DIAGNOSIS — J189 Pneumonia, unspecified organism: Secondary | ICD-10-CM

## 2017-12-30 DIAGNOSIS — R600 Localized edema: Secondary | ICD-10-CM | POA: Diagnosis not present

## 2017-12-30 DIAGNOSIS — E039 Hypothyroidism, unspecified: Secondary | ICD-10-CM

## 2017-12-30 DIAGNOSIS — I10 Essential (primary) hypertension: Secondary | ICD-10-CM | POA: Diagnosis not present

## 2017-12-30 DIAGNOSIS — E785 Hyperlipidemia, unspecified: Secondary | ICD-10-CM

## 2017-12-30 DIAGNOSIS — G500 Trigeminal neuralgia: Secondary | ICD-10-CM | POA: Diagnosis not present

## 2017-12-30 LAB — LIPID PANEL
Cholesterol: 260 mg/dL — ABNORMAL HIGH (ref 0–200)
HDL: 52.5 mg/dL (ref >39.00)
NONHDL: 207.71
Total CHOL/HDL Ratio: 5
Triglycerides: 261 mg/dL — ABNORMAL HIGH (ref 0.0–149.0)
VLDL: 52.2 mg/dL — AB (ref 0.0–40.0)

## 2017-12-30 LAB — LDL CHOLESTEROL, DIRECT: LDL DIRECT: 186 mg/dL

## 2017-12-30 MED ORDER — LEVOTHYROXINE SODIUM 75 MCG PO TABS: 75.0000 ug | ORAL_TABLET | Freq: Every day | ORAL | 3 refills | Status: DC

## 2017-12-30 NOTE — Progress Notes (Signed)
New Patient Office Visit  Subjective:  Patient ID: [name], female    DOB: Jul 11, 1945  Age: 72 y.o. MRN: [id number]  CC:  Chief Complaint  Patient presents with  . Hypothyroidism    Pt here for follow up. Has not been seen since 01/2016.    HPI DELEAH Wong presents for follow up.  Hypothyroid- reports that she has been taking her synthroid.  Occasionally misses a dose. This has been rx'd by Dr. Rex Kras in [place] Lab Results  Component Value Date   TSH 6.510 (H) 12/07/2017   Trigeminal Neuralgia- follows with Dr. Tawni Levy from pain management.   LE edema- reports improved with lasix. Had essentially normal echo recently. Saw cardiology recently.   Hyperlipidemia-  Lab Results  Component Value Date   CHOL 242 (H) 08/01/2014   HDL 54.00 08/01/2014   LDLCALC 155 (H) 08/01/2014   LDLDIRECT 203.3 12/15/2012   TRIG 164.0 (H) 08/01/2014   CHOLHDL 4 08/01/2014   HTN- maintained on avapro.   BP Readings from Last 3 Encounters:  12/30/17 (!) 159/58  12/21/17 (!) 147/64  12/20/17 (!) 150/78   Pneumonia- was treated with abx on 12/20/17.  Did not have cough.  Did have some shortness of breath which has improved some. She still works so she has trouble   Reports that she has complete deafness in the left ear and has some hearing loss in the right ear.  She is following with Dr Ernesto Rutherford ENT for this.     Past Medical History:  Diagnosis Date  . Abnormality of gait   . Brachial neuritis or radiculitis NOS   . Cervicalgia   . Facial nerve disorder   . Hearing loss    Left ear  . 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 Hyperlipidemia   . Hypertension   . Macular degeneration   . Myofascial pain   . Pneumonia 2005  . Thyroid disease    hyprothyroidism  . Trigeminal neuralgia     Past Surgical History:  Procedure Laterality Date  . ABDOMINAL HYSTERECTOMY    . [place]   closed hole in heart  . CATARACT  EXTRACTION Left 02/02/2016  . LUMBAR DISC SURGERY     Dr Eddie Dibbles  . no colonoscopy     "scared"; SOC reviewed  . 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; 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    Family History  Problem Relation Age of Onset  . Hypertension Mother   . Transient ischemic attack Mother   . Coronary artery disease Father        CHF  . Coronary artery disease Sister   . Macular degeneration Sister   . Transient ischemic attack Maternal Grandmother   . Diabetes Neg Hx   . Cancer Neg Hx     Social History   Socioeconomic History  . Marital status: Married    Spouse name: Not on file  . Number of children: 1  . Years of education: Not on file  . Highest education level: Not on file  Occupational History  . Occupation: Retired  Scientific laboratory technician  . Financial resource strain: Not on file  . Food insecurity:    Worry: Not on file    Inability: Not on file  . Transportation needs:    Medical: Not on file    Non-medical: Not on file  Tobacco Use  . Smoking  status: Never Smoker  . Smokeless tobacco: Never Used  Substance and Sexual Activity  . Alcohol use: No    Alcohol/week: 0.0 standard drinks  . Drug use: No  . Sexual activity: Not on file  Lifestyle  . Physical activity:    Days per week: Not on file    Minutes per session: Not on file  . Stress: Not on file  Relationships  . Social connections:    Talks on phone: Not on file    Gets together: Not on file    Attends religious service: Not on file    Active member of club or organization: Not on file    Attends meetings of clubs or organizations: Not on file    Relationship status: Not on file  . Intimate partner violence:    Fear of current or ex partner: Not on file    Emotionally abused: Not on file    Physically abused: Not on file    Forced sexual activity: Not on file  Other Topics Concern  . Not on file  Social History Narrative  . Not on file    ROS Review of Systems  Objective:   Today's  Vitals: Wt 191 lb 6.4 oz (86.8 kg)   BMI 33.90 kg/m  Past Medical History:  Diagnosis Date  . Abnormality of gait   . Brachial neuritis or radiculitis NOS   . Cervicalgia   . Facial nerve disorder   . Hearing loss    Left ear  . 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 Hyperlipidemia   . Hypertension   . Macular degeneration   . Myofascial pain   . Pneumonia 2005  . Thyroid disease    hyprothyroidism  . Trigeminal neuralgia      Social History   Socioeconomic History  . Marital status: Married    Spouse name: Not on file  . Number of children: 1  . Years of education: Not on file  . Highest education level: Not on file  Occupational History  . Occupation: Retired  Scientific laboratory technician  . Financial resource strain: Not on file  . Food insecurity:    Worry: Not on file    Inability: Not on file  . Transportation needs:    Medical: Not on file    Non-medical: Not on file  Tobacco Use  . Smoking status: Never Smoker  . Smokeless tobacco: Never Used  Substance and Sexual Activity  . Alcohol use: No    Alcohol/week: 0.0 standard drinks  . Drug use: No  . Sexual activity: Not on file  Lifestyle  . Physical activity:    Days per week: Not on file    Minutes per session: Not on file  . Stress: Not on file  Relationships  . Social connections:    Talks on phone: Not on file    Gets together: Not on file    Attends religious service: Not on file    Active member of club or organization: Not on file    Attends meetings of clubs or organizations: Not on file    Relationship status: Not on file  . Intimate partner violence:    Fear of current or ex partner: Not on file    Emotionally abused: Not on file    Physically abused: Not on file    Forced sexual activity: Not on file  Other Topics Concern  . Not on file  Social History Narrative  .  Not on file    Past Surgical History:  Procedure Laterality Date  . ABDOMINAL HYSTERECTOMY    . [place]   closed hole in heart  . CATARACT EXTRACTION Left 02/02/2016  . LUMBAR DISC SURGERY     Dr Eddie Dibbles  . no colonoscopy     "scared"; SOC reviewed  . 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; 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    Family History  Problem Relation Age of Onset  . Hypertension Mother   . Transient ischemic attack Mother   . Coronary artery disease Father        CHF  . Coronary artery disease Sister   . Macular degeneration Sister   . Transient ischemic attack Maternal Grandmother   . Diabetes Neg Hx   . Cancer Neg Hx     Allergies  Allergen Reactions  . Morphine     REACTION: GI upset    Current Outpatient Medications on File Prior to Visit  Medication Sig Dispense Refill  . BIOTIN 5000 PO Take 1 capsule by mouth daily.     . clorazepate (TRANXENE) 7.5 MG tablet Take 7.5 mg by mouth 2 (two) times daily as needed for anxiety.    . diclofenac sodium (VOLTAREN) 1 % GEL Apply 1 application topically 3 (three) times daily. To left face 3 Tube 4  . estradiol (ESTRACE) 0.5 MG tablet Take 1 tablet by mouth daily. q    . furosemide (LASIX) 40 MG tablet Take 1 tablet (40 mg total) by mouth daily. 90 tablet 3  . irbesartan (AVAPRO) 300 MG tablet Take 1 tablet (300 mg total) by mouth daily. 90 tablet 3  . methadone (DOLOPHINE) 10 MG tablet Take 1 tablet (10 mg total) by mouth daily. 30 tablet 0  . Multiple Vitamins-Minerals (PRESERVISION AREDS PO) Take 1 capsule by mouth daily.     Marland Kitchen OVER THE COUNTER MEDICATION COLAGEN POWER.  Take 1 scoopful in liquid daily.    . tapentadol (NUCYNTA ER) 50 MG 12 hr tablet Take 1 tablet (50 mg total) by mouth every 12 (twelve) hours as needed (nerve pain). 30 tablet 0   No current facility-administered medications on file prior to visit.     BP (!) 159/58 (BP Location: Right Arm, Cuff Size: Normal)   Pulse 85   Temp 98.4 F (36.9 C) (Oral)   Resp 16   Ht 5\' 3"  (1.6 m)   Wt 169 lb 12.8 oz (77 kg)   BMI 30.08 kg/m    Physical  Exam  Constitutional: She is oriented to person, place, and time. She appears well-developed and well-nourished.  Cardiovascular: Normal rate, regular rhythm and normal heart sounds.  No murmur heard. Pulmonary/Chest: Effort normal and breath sounds normal. No respiratory distress. She has no wheezes.  Musculoskeletal:  2+ bilateral LE edema.   Neurological: She is alert and oriented to person, place, and time.  Left sided facial droop  Skin: Skin is warm and dry.  Psychiatric: She has a normal mood and affect. Her behavior is normal. Judgment and thought content normal.    Assessment & Plan:  Hypertension- blood pressure slightly elevated today.  We will plan to recheck at next visit and if still elevated may need additional medication adjustment.  Pneumonia-completed treatment.  Lungs clear today.  She already has an order in the system for follow-up chest x-ray per cardiology which she plans to complete before Thanksgiving.  Trigeminal neuralgia-stable management per  Dr. Tessa Lerner.  LE edema- improved, continue lasix.   Hypothyroid- uncontrolled.  Discussed importance of compliance.  Will increase from 50 to 75 mcg once daily.  Hyperlipidemia- obtain a follow-up lipid panel.  She declines flu shot or Pneumovax today.   Problem List Items Addressed This Visit    None      Outpatient Encounter Medications as of 12/30/2017  Medication Sig  . BIOTIN 5000 PO Take 1 capsule by mouth daily.   . clorazepate (TRANXENE) 7.5 MG tablet Take 7.5 mg by mouth 2 (two) times daily as needed for anxiety.  . diclofenac sodium (VOLTAREN) 1 % GEL Apply 1 application topically 3 (three) times daily. To left face  . estradiol (ESTRACE) 0.5 MG tablet Take 1 tablet by mouth daily. q  . furosemide (LASIX) 40 MG tablet Take 1 tablet (40 mg total) by mouth daily.  . irbesartan (AVAPRO) 300 MG tablet Take 1 tablet (300 mg total) by mouth daily.  Marland Kitchen levothyroxine (SYNTHROID, LEVOTHROID) 50 MCG tablet  Take 50 mcg by mouth daily before breakfast.  . methadone (DOLOPHINE) 10 MG tablet Take 1 tablet (10 mg total) by mouth daily.  . Multiple Vitamins-Minerals (PRESERVISION AREDS PO) Take 1 capsule by mouth daily.   Marland Kitchen OVER THE COUNTER MEDICATION COLAGEN POWER.  Take 1 scoopful in liquid daily.  . tapentadol (NUCYNTA ER) 50 MG 12 hr tablet Take 1 tablet (50 mg total) by mouth every 12 (twelve) hours as needed (nerve pain).  . [DISCONTINUED] doxycycline (VIBRAMYCIN) 100 MG capsule Take 1 capsule (100 mg total) by mouth 2 (two) times daily. (Patient not taking: Reported on 12/30/2017)   No facility-administered encounter medications on file as of 12/30/2017.     Follow-up: No follow-ups on file.   Nance Pear, NP

## 2017-12-30 NOTE — Patient Instructions (Signed)
Please complete lab work prior to leaving.   

## 2018-01-01 ENCOUNTER — Encounter: Payer: Self-pay | Admitting: Family

## 2018-01-10 ENCOUNTER — Ambulatory Visit
Admission: RE | Admit: 2018-01-10 | Discharge: 2018-01-10 | Disposition: A | Discharge status: Home / Self Care | Payer: Medicare Other | Source: Ambulatory Visit | Attending: Nurse Practitioner | Admitting: Nurse Practitioner

## 2018-01-10 DIAGNOSIS — J189 Pneumonia, unspecified organism: Secondary | ICD-10-CM

## 2018-01-10 DIAGNOSIS — J181 Lobar pneumonia, unspecified organism: Principal | ICD-10-CM

## 2018-01-10 DIAGNOSIS — R0602 Shortness of breath: Secondary | ICD-10-CM | POA: Diagnosis not present

## 2018-01-11 ENCOUNTER — Telehealth: Payer: Self-pay | Admitting: Nurse Practitioner

## 2018-01-11 NOTE — Telephone Encounter (Signed)
Spoke with pt and went over CXR results.  Pt appreciative for call.

## 2018-01-11 NOTE — Telephone Encounter (Signed)
New message ° ° ° °Pt is returning call about results. °

## 2018-01-23 ENCOUNTER — Encounter: Payer: Self-pay | Admitting: Nurse Practitioner

## 2018-01-23 ENCOUNTER — Other Ambulatory Visit: Payer: Medicare Other

## 2018-01-23 ENCOUNTER — Ambulatory Visit (INDEPENDENT_AMBULATORY_CARE_PROVIDER_SITE_OTHER): Payer: Medicare Other | Admitting: Nurse Practitioner

## 2018-01-23 VITALS — BP 160/80 | HR 80 | Ht 63.0 in | Wt 174.0 lb

## 2018-01-23 DIAGNOSIS — I10 Essential (primary) hypertension: Secondary | ICD-10-CM

## 2018-01-23 DIAGNOSIS — R0600 Dyspnea, unspecified: Secondary | ICD-10-CM | POA: Diagnosis not present

## 2018-01-23 MED ORDER — SPIRONOLACTONE 25 MG PO TABS: 25.0000 mg | ORAL_TABLET | Freq: Every day | ORAL | 3 refills | Status: DC

## 2018-01-23 NOTE — Patient Instructions (Addendum)
We will be checking the following labs today - BMET  BMET in one week    Medication Instructions:    Continue with your current medicines. BUT  I am adding Aldactone 25 mg to take one a day - this is at your pharmacy   If you need a refill on your cardiac medications before your next appointment, please call your pharmacy.     Testing/Procedures To Be Arranged:  N/A  Follow-Up:   See me in a month  See Dr. Tamala Julian as planned in February    At [facility], you and your health needs are our priority.  As part of our continuing mission to provide you with exceptional heart care, we have created designated Provider Care Teams.  These Care Teams include your primary Cardiologist (physician) and Advanced Practice Providers (APPs -  Physician Assistants and Nurse Practitioners) who all work together to provide you with the care you need, when you need it.  Special Instructions:  . None  Call the [place] office at [phone] if you have any questions, problems or concerns.

## 2018-01-23 NOTE — Progress Notes (Signed)
CARDIOLOGY OFFICE NOTE  Date:  01/23/2018    [name] Date of Birth: 28-Apr-1945 Medical Record #[id number]  PCP:  Debbrah Alar, NP  Cardiologist:  Jennings Books    Chief Complaint  Patient presents with  . Hypertension    One month check - seen for Dr. Tamala Julian    History of Present Illness: [name] is a 72 y.o. female who presents today for a one month check. Seen for Dr. Tamala Julian.   She has a history of SOB - was referred here back in October by PCP for swelling and dyspnea. She has had long standing HTN, paroxysmal supraventricular tachycardia treated with ablation in 2005 and reported congenital heart disease with "being a blue baby and had valve surgery as a child" with what sounds like thru a thoracotomy. Prior brain tumor with subsequent nerve pain and left sided facial numbness - requiring Methadone.      Seen in October by Dr. Tamala Julian - she had had multiple med changes prior - he was concerned for heart failure - Lasix was started and echo was obtained. I then saw her a month later - she was improving. Still short of breath. TSH was elevated - she had not adjusted her dose of Synthroid up. I did send her for a CXR - concerning for pneumonia - I treated her with antibiotics and follow up CXR was stable.   Comes in today. Here alone. She just does not feel well. Weak and sleepy. No real energy. She equates a lot of how she is feeling to how she felt when she had her brain tumor 15 years ago - she is contemplating going back to see someone in [place] to discuss and have repeat MRI - last study about a year and a half ago. BP is about 160 to 175 at home. It is high here today as well. She still has some swelling. Her breathing may be some better. She was happy with how the follow up CXR turned out but still somewhat worrisome that she really had no signs of pneumonia. No chest pain.    Past Medical History:  Diagnosis Date  . Abnormality of gait   .  Brachial neuritis or radiculitis NOS   . Cervicalgia   . Facial nerve disorder   . Hearing loss    Left ear  . 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 Hyperlipidemia   . Hypertension   . Macular degeneration   . Myofascial pain   . Pneumonia 2005  . Thyroid disease    hyprothyroidism  . Trigeminal neuralgia     Past Surgical History:  Procedure Laterality Date  . ABDOMINAL HYSTERECTOMY    . [place]   closed hole in heart  . CATARACT EXTRACTION Left 02/02/2016  . LUMBAR DISC SURGERY     Dr Eddie Dibbles  . no colonoscopy     "scared"; SOC reviewed  . 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; 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     Medications: Current Meds  Medication Sig  . BIOTIN 5000 PO Take 1 capsule by mouth daily.   . clorazepate (TRANXENE) 7.5 MG tablet Take 7.5 mg by mouth 2 (two) times daily as needed for anxiety.  . diclofenac sodium (VOLTAREN) 1 % GEL Apply 1 application topically 3 (three) times daily. To left face  . estradiol (ESTRACE) 0.5 MG tablet Take 1  tablet by mouth daily. q  . furosemide (LASIX) 40 MG tablet Take 1 tablet (40 mg total) by mouth daily.  . irbesartan (AVAPRO) 300 MG tablet Take 1 tablet (300 mg total) by mouth daily.  Marland Kitchen levothyroxine (SYNTHROID, LEVOTHROID) 75 MCG tablet Take 1 tablet (75 mcg total) by mouth daily.  . methadone (DOLOPHINE) 10 MG tablet Take 1 tablet (10 mg total) by mouth daily.  . Multiple Vitamins-Minerals (PRESERVISION AREDS PO) Take 1 capsule by mouth daily.   Marland Kitchen OVER THE COUNTER MEDICATION COLAGEN POWER.  Take 1 scoopful in liquid daily.  . tapentadol (NUCYNTA ER) 50 MG 12 hr tablet Take 1 tablet (50 mg total) by mouth every 12 (twelve) hours as needed (nerve pain).     Allergies: Allergies  Allergen Reactions  . Morphine     REACTION: GI upset    Social History: The patient  reports that she has never smoked. She has never used smokeless tobacco. She reports that she does not drink alcohol  or use drugs.   Family History: The patient's family history includes Coronary artery disease in her father and sister; Hypertension in her mother; Macular degeneration in her sister; Transient ischemic attack in her maternal grandmother and mother.   Review of Systems: Please see the history of present illness.   Otherwise, the review of systems is positive for none.   All other systems are reviewed and negative.   Physical Exam: VS:  BP (!) 160/80   Pulse 80   Ht 5\' 3"  (1.6 m)   Wt 174 lb (78.9 kg)   SpO2 99%   BMI 30.82 kg/m  .  BMI Body mass index is 30.82 kg/m.  Wt Readings from Last 3 Encounters:  01/23/18 174 lb (78.9 kg)  12/30/17 169 lb 12.8 oz (77 kg)  12/21/17 171 lb 9.6 oz (77.8 kg)   BP is 170/70 by me.  General: Pleasant. Well developed, well nourished and in no acute distress.   HEENT: Normal. She has a facial deformity.  Neck: Supple, no JVD, carotid bruits, or masses noted.  Cardiac: Regular rate and rhythm. HR seems a little fast but regular. +1 ankle edema.  Respiratory:  Lungs are clear to auscultation bilaterally with normal work of breathing.  GI: Soft and nontender.  MS: No deformity or atrophy. Gait and ROM intact.  Skin: Warm and dry. Color is normal.  Neuro:  Strength and sensation are intact and no gross focal deficits noted.  Psych: Alert, appropriate and with normal affect.   LABORATORY DATA:  EKG:  EKG is not ordered today.  Lab Results  Component Value Date   WBC 7.9 03/20/2014   HGB 13.8 03/20/2014   HCT 40.9 03/20/2014   PLT 279.0 03/20/2014   GLUCOSE 116 (H) 12/20/2017   CHOL 260 (H) 12/30/2017   TRIG 261.0 (H) 12/30/2017   HDL 52.50 12/30/2017   LDLDIRECT 186.0 12/30/2017   LDLCALC 155 (H) 08/01/2014   ALT 12 12/07/2017   AST 13 12/07/2017   NA 144 12/20/2017   K 4.6 12/20/2017   CL 102 12/20/2017   CREATININE 0.97 12/20/2017   BUN 21 12/20/2017   CO2 26 12/20/2017   TSH 6.510 (H) 12/07/2017   HGBA1C 5.9 03/21/2014      BNP (last 3 results) No results for input(s): BNP in the last 8760 hours.  ProBNP (last 3 results) Recent Labs    12/07/17 1247  PROBNP 507*     Other Studies Reviewed Today:  CXR FINDINGS  01/10/2018: Linear densities in the lingula are stable, favor scarring. No acute confluent airspace opacities or effusions. Heart is normal size. No acute bony abnormality.  IMPRESSION: Lingular scarring.  No active disease.   Electronically Signed   By: Rolm Baptise M.D.   On: 01/10/2018 10:01   CXR IMPRESSION 12/20/2017: Mild chronic bronchitic-reactive airway changes. Subsegmental atelectasis or early pneumonia in the left mid and lower lung. No overt CHF. Followup PA and lateral chest X-ray is recommended in 3-4 weeks following trial of antibiotic therapy to ensure resolution and exclude underlying malignancy.   Electronically Signed   By: David  Martinique M.D.   On: 12/20/2017 16:03  Echo Study Conclusions 11/2017  - Left ventricle: The cavity size was normal. Systolic function was normal. The estimated ejection fraction was in the range of 60% to 65%. Wall motion was normal; there were no regional wall motion abnormalities. Left ventricular diastolic function parameters were normal. - Mitral valve: Moderately calcified annulus. Moderately thickened, moderately calcified leaflets . - Pulmonary arteries: PA peak pressure: 31 mm Hg (S).   Assessment/Plan:  1. HTN - not controlled - she endorses so much fatigue/weakness that I am hesitant to try beta blocker. Will add Aldactone 25 mg a day. BMET today and again in one week. Will need to follow her lab closely.   2. Dyspnea/swelling -  her echo looks basically normal - she does get too much salt and is trying to restrict. Adding Aldactone today.  3. PSVT - resolved.   4. Reported congenital heart disease - her echo is basically normal.   5. Prior brain tumor with subsequent nerve damage -  chronic pain syndrome - she really feels that a lot of her symptoms are very similar to her presentation back in 2005. She will discuss with Dr. Tessa Lerner as well as someone she saw in the past in [place].   6. ?Early pneumonia - surprising to me - I did treat her with a round of antibiotics. Follow up CXR with scarring noted.   Current medicines are reviewed with the patient today.  The patient does not have concerns regarding medicines other than what has been noted above.  The following changes have been made:  See above.  Labs/ tests ordered today include:    Orders Placed This Encounter  Procedures  . Basic metabolic panel  . Basic metabolic panel     Disposition:   FU with ne in 4 weeks. She has follow up with Dr. Tamala Julian in February.    Patient is agreeable to this plan and will call if any problems develop in the interim.   SignedTruitt Merle, NP  01/23/2018 2:22 PM  [facility] [address] [place], [place]  [address] Phone: [phone] Fax: [phone]

## 2018-01-24 ENCOUNTER — Telehealth: Payer: Self-pay | Admitting: *Deleted

## 2018-01-24 DIAGNOSIS — Z79899 Other long term (current) drug therapy: Secondary | ICD-10-CM

## 2018-01-24 LAB — BASIC METABOLIC PANEL
BUN/Creatinine Ratio: 17 (ref 12–28)
BUN: 16 mg/dL (ref 8–27)
CO2: 22 mmol/L (ref 20–29)
Calcium: 9.8 mg/dL (ref 8.7–10.3)
Chloride: 108 mmol/L — ABNORMAL HIGH (ref 96–106)
Creatinine, Ser: 0.95 mg/dL (ref 0.57–1.00)
GFR calc Af Amer: 68 mL/min/{1.73_m2} (ref >59)
GFR calc non Af Amer: 59 mL/min/{1.73_m2} — ABNORMAL LOW (ref >59)
Glucose: 117 mg/dL — ABNORMAL HIGH (ref 65–99)
Potassium: 5 mmol/L (ref 3.5–5.2)
Sodium: 147 mmol/L — ABNORMAL HIGH (ref 134–144)

## 2018-01-24 MED ORDER — SPIRONOLACTONE 25 MG PO TABS: 12.5000 mg | ORAL_TABLET | Freq: Every day | ORAL | 3 refills | Status: DC

## 2018-01-24 NOTE — Telephone Encounter (Signed)
folllow up    Patient returning call about results

## 2018-01-24 NOTE — Telephone Encounter (Signed)
-----   Message from Burtis Junes, NP sent at 01/24/2018  7:41 AM EST ----- Please call - her potassium is on the higher side - I started Aldactone yesterday which may make this worse.  Let's cut the dose to 12.5 mg a day. Let's get the repeat BMET on Thursday please and as planned next week.

## 2018-01-24 NOTE — Telephone Encounter (Signed)
Pt returned my call.  She will decrease the Aldactone back to 1/2 tablet = 12.5 mg daily and repeat BMET 01/26/18.

## 2018-01-24 NOTE — Telephone Encounter (Signed)
Called pt re: lab results below. Left a message with pt's husband to have her call the office.

## 2018-01-25 ENCOUNTER — Encounter: Payer: Self-pay | Admitting: Nurse Practitioner

## 2018-01-26 ENCOUNTER — Other Ambulatory Visit: Payer: Medicare Other

## 2018-01-26 DIAGNOSIS — Z79899 Other long term (current) drug therapy: Secondary | ICD-10-CM | POA: Diagnosis not present

## 2018-01-27 LAB — BASIC METABOLIC PANEL
BUN/Creatinine Ratio: 17 (ref 12–28)
BUN: 17 mg/dL (ref 8–27)
CO2: 23 mmol/L (ref 20–29)
Calcium: 10 mg/dL (ref 8.7–10.3)
Chloride: 104 mmol/L (ref 96–106)
Creatinine, Ser: 0.98 mg/dL (ref 0.57–1.00)
GFR calc Af Amer: 66 mL/min/{1.73_m2} (ref >59)
GFR calc non Af Amer: 57 mL/min/{1.73_m2} — ABNORMAL LOW (ref >59)
Glucose: 99 mg/dL (ref 65–99)
Potassium: 4.8 mmol/L (ref 3.5–5.2)
Sodium: 144 mmol/L (ref 134–144)

## 2018-01-30 ENCOUNTER — Telehealth: Payer: Self-pay | Admitting: Nurse Practitioner

## 2018-01-30 ENCOUNTER — Other Ambulatory Visit: Payer: Medicare Other

## 2018-01-30 NOTE — Telephone Encounter (Signed)
Patient returning call regarding lab results  ?

## 2018-02-20 ENCOUNTER — Encounter: Payer: Medicare Other | Attending: Physical Medicine & Rehabilitation | Admitting: Physical Medicine & Rehabilitation

## 2018-02-20 ENCOUNTER — Encounter: Payer: Self-pay | Admitting: Physical Medicine & Rehabilitation

## 2018-02-20 ENCOUNTER — Encounter: Payer: Self-pay | Admitting: Nurse Practitioner

## 2018-02-20 ENCOUNTER — Ambulatory Visit (INDEPENDENT_AMBULATORY_CARE_PROVIDER_SITE_OTHER): Payer: Medicare Other | Admitting: Nurse Practitioner

## 2018-02-20 VITALS — BP 158/80 | HR 113 | Ht 63.0 in | Wt 173.4 lb

## 2018-02-20 VITALS — BP 134/61 | HR 85 | Ht 63.0 in | Wt 172.8 lb

## 2018-02-20 DIAGNOSIS — G500 Trigeminal neuralgia: Secondary | ICD-10-CM | POA: Diagnosis not present

## 2018-02-20 DIAGNOSIS — G8929 Other chronic pain: Secondary | ICD-10-CM | POA: Insufficient documentation

## 2018-02-20 DIAGNOSIS — G894 Chronic pain syndrome: Secondary | ICD-10-CM | POA: Diagnosis not present

## 2018-02-20 DIAGNOSIS — D333 Benign neoplasm of cranial nerves: Secondary | ICD-10-CM | POA: Diagnosis not present

## 2018-02-20 DIAGNOSIS — R0600 Dyspnea, unspecified: Secondary | ICD-10-CM

## 2018-02-20 DIAGNOSIS — I10 Essential (primary) hypertension: Secondary | ICD-10-CM | POA: Diagnosis not present

## 2018-02-20 MED ORDER — METHADONE HCL 10 MG PO TABS: 10.0000 mg | ORAL_TABLET | Freq: Every day | ORAL | 0 refills | Status: DC

## 2018-02-20 MED ORDER — TAPENTADOL HCL ER 50 MG PO TB12: 50.0000 mg | ORAL_TABLET | Freq: Two times a day (BID) | ORAL | 0 refills | Status: DC | PRN

## 2018-02-20 MED ORDER — METOPROLOL SUCCINATE ER 25 MG PO TB24: 25.0000 mg | ORAL_TABLET | Freq: Every day | ORAL | 6 refills | Status: DC

## 2018-02-20 NOTE — Patient Instructions (Addendum)
We will be checking the following labs today - BMET, CBC, BNP   Medication Instructions:    Continue with your current medicines. BUT  I am adding Toprol 25 mg to take once a day   If you need a refill on your cardiac medications before your next appointment, please call your pharmacy.     Testing/Procedures To Be Arranged:  Lexiscan Myoview  Follow-Up:   See Dr. Tamala Julian as planned next month    At [facility], you and your health needs are our priority.  As part of our continuing mission to provide you with exceptional heart care, we have created designated Provider Care Teams.  These Care Teams include your primary Cardiologist (physician) and Advanced Practice Providers (APPs -  Physician Assistants and Nurse Practitioners) who all work together to provide you with the care you need, when you need it.  Special Instructions:  You are scheduled for a Myocardial Perfusion Imaging Study on ____________________________ at _______________________________________.   Please arrive 15 minutes prior to your appointment time for registration and insurance purposes.   The test will take approximately 3 to 4 hours to complete; you may bring reading material. If someone comes with you to your appointment, they will need to remain in the main lobby due to limited space in the testing area.    How to prepare for your Myocardial Perfusion test:   Do not eat or drink 3 hours prior to your test, except you may have water.    Do not consume products containing caffeine (regular or decaffeinated) 12 hours prior to your test (ex: coffee, chocolate, soda, tea)   Do bring a list of your current medications with you. If not listed below, you may take your medications as normal.    Bring any held medication to your appointment, as you may be required to take it once the test is complete.   Do wear comfortable clothes (no dresses or overalls) and walking shoes. Tennis shoes are preferred. No  heels or open toed shoes.  Do not wear cologne, perfume, aftershave or lotions (deodorant is allowed).   If these instructions are not followed, you test will have to be rescheduled.   Please report to [address] for your test. If you have questions or concerns about your appointment, please call the Nuclear Lab at [phone].  If you cannot keep your appointment, please provide 24 hour notification to the Nuclear lab to avoid a possible $50 charge to your account.    .  .   Call the [place] office at [phone] if you have any questions, problems or concerns.

## 2018-02-20 NOTE — Patient Instructions (Signed)
PLEASE FEEL FREE TO CALL OUR OFFICE WITH ANY PROBLEMS OR QUESTIONS ([phone])      

## 2018-02-20 NOTE — Progress Notes (Signed)
CARDIOLOGY OFFICE NOTE  Date:  02/20/2018    [name] Date of Birth: 1945-10-03 Medical Record #[id number]  PCP:  Debbrah Alar, NP  Cardiologist:  Jennings Books    Chief Complaint  Patient presents with  . Shortness of Breath  . Hypertension    Follow up visit - seen for Dr. Tamala Julian    History of Present Illness: [name] is a 73 y.o. female who presents today for a follow up visit. Seen for Dr. Tamala Julian.   She has a history of SOB - was referred here back in October of 2019 by PCP for swelling and dyspnea. She has had long standing HTN,paroxysmal supraventricular tachycardia treated with ablation in 2005and reported congenital heart disease with "being a blue baby and had valve surgery as a child" with what sounds like thru a thoracotomy.Prior brain tumor with subsequent nerve pain and left sided facial numbness - requiring Methadone.    Seen in October by Dr. Tamala Julian - she had had multiple med changes prior - he was concerned for heart failure - Lasix was started and echo was obtained.I then saw her a month later - she was improving. Still short of breath. TSH was elevated - she had not adjusted her dose of Synthroid up. I did send her for a CXR - concerning for pneumonia - I treated her with antibiotics and follow up CXR was stable. I last saw her about a month ago - was not feeling well - weak and sleepy and equated her symptoms to how she felt with her brain tumor from 15 years ago - she was contemplating going to see prior provider for discussion and MRI. Aldactone was added for her elevated BP.   Comes in today. Herealone. She still does not feel like she is doing well. She continues to be short of breath. She remains fatigued. Still with some occasional swelling. BP is running 140 to 160 at home. FH is + for CAD - brother died while having CABG. Father with history of CABG on two occasions. She is frustrated. Wondering how much anxiety is playing  a role.   Past Medical History:  Diagnosis Date  . Abnormality of gait   . Brachial neuritis or radiculitis NOS   . Cervicalgia   . Facial nerve disorder   . Hearing loss    Left ear  . 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 Hyperlipidemia   . Hypertension   . Macular degeneration   . Myofascial pain   . Pneumonia 2005  . Thyroid disease    hyprothyroidism  . Trigeminal neuralgia     Past Surgical History:  Procedure Laterality Date  . ABDOMINAL HYSTERECTOMY    . [place]   closed hole in heart  . CATARACT EXTRACTION Left 02/02/2016  . LUMBAR DISC SURGERY     Dr Eddie Dibbles  . no colonoscopy     "scared"; SOC reviewed  . 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; 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     Medications: Current Meds  Medication Sig  . BIOTIN 5000 PO Take 1 capsule by mouth daily.   . clorazepate (TRANXENE) 7.5 MG tablet Take 7.5 mg by mouth 2 (two) times daily as needed for anxiety.  . diclofenac sodium (VOLTAREN) 1 % GEL Apply 1 application topically 3 (three) times daily. To left face  . estradiol (ESTRACE) 0.5 MG tablet  Take 1 tablet by mouth daily. q  . furosemide (LASIX) 40 MG tablet Take 1 tablet (40 mg total) by mouth daily.  . irbesartan (AVAPRO) 300 MG tablet Take 1 tablet (300 mg total) by mouth daily.  Marland Kitchen levothyroxine (SYNTHROID, LEVOTHROID) 75 MCG tablet Take 1 tablet (75 mcg total) by mouth daily.  . methadone (DOLOPHINE) 10 MG tablet Take 1 tablet (10 mg total) by mouth daily.  . Multiple Vitamins-Minerals (PRESERVISION AREDS PO) Take 1 capsule by mouth daily.   Marland Kitchen OVER THE COUNTER MEDICATION COLAGEN POWER.  Take 1 scoopful in liquid daily.  Marland Kitchen spironolactone (ALDACTONE) 25 MG tablet Take 0.5 tablets (12.5 mg total) by mouth daily.  . tapentadol (NUCYNTA ER) 50 MG 12 hr tablet Take 1 tablet (50 mg total) by mouth every 12 (twelve) hours as needed (nerve pain).     Allergies: Allergies  Allergen Reactions  . Morphine      REACTION: GI upset    Social History: The patient  reports that she has never smoked. She has never used smokeless tobacco. She reports that she does not drink alcohol or use drugs.   Family History: The patient's family history includes Coronary artery disease in her father and sister; Hypertension in her mother; Macular degeneration in her sister; Transient ischemic attack in her maternal grandmother and mother.   Review of Systems: Please see the history of present illness.   Otherwise, the review of systems is positive for none.   All other systems are reviewed and negative.   Physical Exam: VS:  BP (!) 158/80 (BP Location: Left Arm, Patient Position: Sitting, Cuff Size: Normal)   Pulse (!) 113   Ht 5\' 3"  (1.6 m)   Wt 173 lb 6.4 oz (78.7 kg)   SpO2 100% Comment: at rest  BMI 30.72 kg/m  .  BMI Body mass index is 30.72 kg/m.  Wt Readings from Last 3 Encounters:  02/20/18 173 lb 6.4 oz (78.7 kg)  02/20/18 172 lb 12.8 oz (78.4 kg)  01/23/18 174 lb (78.9 kg)    General: Pleasant. Well developed, well nourished and in no acute distress.   HEENT: Normal. She has a facial deformity.  Neck: Supple, no JVD, carotid bruits, or masses noted.  Cardiac: Regular rate but a little fast. She has trace edema.  Respiratory:  Lungs are clear to auscultation bilaterally with normal work of breathing.  GI: Soft and nontender.  MS: No deformity or atrophy. Gait and ROM intact.  Skin: Warm and dry. Color is normal.  Neuro:  Strength and sensation are intact and no gross focal deficits noted.  Psych: Alert, appropriate and with normal affect.   LABORATORY DATA:  EKG:  EKG is ordered today. This shows sinus tach - HR is 113. She has diffuse ST/T wave changes.   Lab Results  Component Value Date   WBC 7.9 03/20/2014   HGB 13.8 03/20/2014   HCT 40.9 03/20/2014   PLT 279.0 03/20/2014   GLUCOSE 99 01/26/2018   CHOL 260 (H) 12/30/2017   TRIG 261.0 (H) 12/30/2017   HDL 52.50 12/30/2017    LDLDIRECT 186.0 12/30/2017   LDLCALC 155 (H) 08/01/2014   ALT 12 12/07/2017   AST 13 12/07/2017   NA 144 01/26/2018   K 4.8 01/26/2018   CL 104 01/26/2018   CREATININE 0.98 01/26/2018   BUN 17 01/26/2018   CO2 23 01/26/2018   TSH 6.510 (H) 12/07/2017   HGBA1C 5.9 03/21/2014     BNP (last 3  results) No results for input(s): BNP in the last 8760 hours.  ProBNP (last 3 results) Recent Labs    12/07/17 1247  PROBNP 507*     Other Studies Reviewed Today:  CXR FINDINGS 01/10/2018: Linear densities in the lingula are stable, favor scarring. No acute confluent airspace opacities or effusions. Heart is normal size. No acute bony abnormality.  IMPRESSION: Lingular scarring. No active disease.   Electronically Signed By: Rolm Baptise M.D. On: 01/10/2018 10:01   CXR IMPRESSION 12/20/2017: Mild chronic bronchitic-reactive airway changes. Subsegmental atelectasis or early pneumonia in the left mid and lower lung. No overt CHF. Followup PA and lateral chest X-ray is recommended in 3-4 weeks following trial of antibiotic therapy to ensure resolution and exclude underlying malignancy.   Electronically Signed By: David Martinique M.D. On: 12/20/2017 16:03  EchoStudy Conclusions10/2019  - Left ventricle: The cavity size was normal. Systolic function was normal. The estimated ejection fraction was in the range of 60% to 65%. Wall motion was normal; there were no regional wall motion abnormalities. Left ventricular diastolic function parameters were normal. - Mitral valve: Moderately calcified annulus. Moderately thickened, moderately calcified leaflets . - Pulmonary arteries: PA peak pressure: 31 mm Hg (S).   Assessment/Plan:  1. HTN - still not controlled - lots of symptoms - will try adding Toprol 25 mg a day.   2. Multitude of complaints - shortness of breath/weakness/fatigue - will arrange for Lexiscan - she is not able to exercise.  EKG with diffuse changes.   3. Dyspnea/swelling - echo basically normal.   4. Reported congenital heart disease - her echo is basically normal.   5. Prior brain tumor with subsequent nerve damage - chronic pain syndrome - she has seen Dr. Tessa Lerner earlier today.   6. Prior pneumonia - surprising to me - I did treat her with a round of antibiotics. Follow up CXR with scarring was noted.   Current medicines are reviewed with the patient today.  The patient does not have concerns regarding medicines other than what has been noted above.  The following changes have been made:  See above.  Labs/ tests ordered today include:    Orders Placed This Encounter  Procedures  . Basic metabolic panel  . CBC  . Pro b natriuretic peptide (BNP)  . MYOCARDIAL PERFUSION IMAGING  . EKG 12-Lead     Disposition:   FU with Dr. Tamala Julian as planned next month. I am happy to see back as needed.   Patient is agreeable to this plan and will call if any problems develop in the interim.   SignedTruitt Merle, NP  02/20/2018 2:53 PM  [facility] [address] [place], [place]  [address] Phone: [phone] Fax: [phone]

## 2018-02-20 NOTE — Progress Notes (Signed)
Subjective:    Patient ID: [name], female    DOB: 21-Feb-1945, 73 y.o.   MRN: [id number]  HPI   [name] is here in follow-up of her chronic pain.  From a pain standpoint she states that she is doing quite well and that her medications are working well.  She uses her methadone daily and the Nucynta at night for more severe pain as needed.  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  Sometimes it causes her fatigue and she does feel that overall her fatigue is a bit more than it used to be.  She maintains follow-up with [facility] cardiology regarding her swelling.  They have been working on blood pressure control as well.  She still has some swelling in her legs despite addition of diuretics.  She has a follow-up visit today.  She has also noticed that her hearing is a bit worse on the right ear and that it may be a little more difficult for her to see out of the left eye.  She has not had any ear or eye evaluation recently.  Pain Inventory Average Pain 7 Pain Right Now 7 My pain is sharp, burning, stabbing and tingling  In the last 24 hours, has pain interfered with the following? General activity 8 Relation with others 9 Enjoyment of life 10 What TIME of day is your pain at its worst? evening Sleep (in general) Good  Pain is worse with: some activites Pain improves with: rest, heat/ice and talking Relief from Meds: 8  Mobility walk without assistance ability to climb steps?  yes do you drive?  yes  Function retired  Neuro/Psych dizziness loss of taste or smell  Prior Studies Any changes since last visit?  no  Physicians involved in your care Any changes since last visit?  no   Family History  Problem Relation Age of Onset  . Hypertension Mother   . Transient ischemic attack Mother   . Coronary artery disease Father        CHF  . Coronary artery disease Sister   . Macular degeneration Sister   . Transient ischemic attack  Maternal Grandmother   . Diabetes Neg Hx   . Cancer Neg Hx    Social History   Socioeconomic History  . Marital status: Married    Spouse name: Not on file  . Number of children: 1  . Years of education: Not on file  . Highest education level: Not on file  Occupational History  . Occupation: Retired  Scientific laboratory technician  . Financial resource strain: Not on file  . Food insecurity:    Worry: Not on file    Inability: Not on file  . Transportation needs:    Medical: Not on file    Non-medical: Not on file  Tobacco Use  . Smoking status: Never Smoker  . Smokeless tobacco: Never Used  Substance and Sexual Activity  . Alcohol use: No    Alcohol/week: 0.0 standard drinks  . Drug use: No  . Sexual activity: Not on file  Lifestyle  . Physical activity:    Days per week: Not on file    Minutes per session: Not on file  . Stress: Not on file  Relationships  . Social connections:    Talks on phone: Not on file    Gets together: Not on file    Attends religious service: Not on file    Active member of  club or organization: Not on file    Attends meetings of clubs or organizations: Not on file    Relationship status: Not on file  Other Topics Concern  . Not on file  Social History Narrative  . Not on file   Past Surgical History:  Procedure Laterality Date  . ABDOMINAL HYSTERECTOMY    . [place]   closed hole in heart  . CATARACT EXTRACTION Left 02/02/2016  . LUMBAR DISC SURGERY     Dr [name]  . no colonoscopy     "scared"; SOC reviewed  . 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 Abnormality of gait   . Brachial neuritis or radiculitis NOS   . Cervicalgia   . Facial nerve disorder   . Hearing loss    Left ear  . 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 Hyperlipidemia   . Hypertension   . Macular degeneration   . Myofascial pain   . Pneumonia 2005  . Thyroid  disease    hyprothyroidism  . Trigeminal neuralgia    BP 134/61   Pulse 85   Ht 5\' 3"  (1.6 m)   Wt 172 lb 12.8 oz (78.4 kg)   SpO2 97%   BMI 30.61 kg/m   Opioid Risk Score:   Fall Risk Score:  `1  Depression screen PHQ 2/9  Depression screen [facility] 2/9 10/24/2017 06/20/2017 02/23/2017 12/29/2016 02/04/2016 05/22/2014 03/07/2014  Decreased Interest 1 0 0 0 0 1 0  Down, Depressed, Hopeless 1 0 0 0 0 0 0  PHQ - 2 Score 2 0 0 0 0 1 0  Altered sleeping - - - - - 1 -  Tired, decreased energy - - - - - 2 -  Change in appetite - - - - - 0 -  Feeling bad or failure about yourself  - - - - - 0 -  Trouble concentrating - - - - - 1 -  Moving slowly or fidgety/restless - - - - - 0 -  Suicidal thoughts - - - - - 0 -  PHQ-9 Score - - - - - 5 -     Review of Systems  Constitutional: Positive for unexpected weight change.  HENT: Negative.   Eyes: Negative.   Respiratory: Negative.   Cardiovascular: Negative.   Gastrointestinal: Positive for constipation.  Endocrine: Negative.   Genitourinary: Negative.   Musculoskeletal: Positive for arthralgias, joint swelling and myalgias.  Skin: Negative.   Allergic/Immunologic: Negative.   Neurological: Positive for dizziness.  Hematological: Negative.   Psychiatric/Behavioral: Negative.   All other systems reviewed and are negative.      Objective:   Physical Exam General: No acute distress HEENT: EOMI, oral membranes moist Cards: reg rate  Chest: normal effort Abdomen: Soft, NT, ND Skin: dry, intact Extremities: no edema Musculoskeletal:  Normal neck ROM Neurological: She is alert and oriented to person, place, and time. Left lid lag and facial droop--no changes Psychiatric:pleasant   Assessment & Plan:  ASSESSMENT:  1. History of vestibular schwannoma resulting in trigeminal neuralgia and left facial nerve injury- no tumor regrowth per MRI--has chronic peri-operative scarring and atrophy which accounts for weakness, pain, and  balance issues.  -stable at present. 2. History of cervicalgia and lumbar spine surgery---recent low back strain? 3. Chronic pain related to CN 5 involvement  4. Hypothyroid  on supplementation  5. Right shoulder pain resolved.  6. Reactive depression and anxiety     PLAN:  1. Methadone 10 mg daily, #30refilled. RF today     Nucynta 50 mg 1 p.o. q.12 h. p.r.n. 30.RF   -We will continue the controlled substance monitoring program, this consists of regular clinic visits, examinations, routine drug screening, pill counts as well as use of [place] Controlled Substance Reporting System. [place] today.   -Medication was refilled and a second prescription was sent to the patient's pharmacy for next month.       2.SOB/pneumonia: per [employer] cards.  3. Hypothyroid: Continue supplementation.  Really needs a full thyroid battery at some point soon.. 4.continue with voltaren gel, topicals. 5.Eye mgt per optho/ENT--stable.  Recommended that she have her hearing rechecked. 6.Follow up with me in2 months. 66minutes of face to face patient care time were spent during this visit. All questions were encouraged and answered.    Follow up MRI of brain later this year. We spent amount a considerable amount of time discussing her hearing, vision and schwannoma. She has a lot of anxiety regarding her function, appearance, and tumor recurrence.  Her last MRI from May 2018 showed stable tumor size and appearance.

## 2018-02-21 LAB — CBC
Hematocrit: 38.5 % (ref 34.0–46.6)
Hemoglobin: 12.9 g/dL (ref 11.1–15.9)
MCH: 29.3 pg (ref 26.6–33.0)
MCHC: 33.5 g/dL (ref 31.5–35.7)
MCV: 87 fL (ref 79–97)
Platelets: 294 10*3/uL (ref 150–450)
RBC: 4.41 x10E6/uL (ref 3.77–5.28)
RDW: 13.3 % (ref 11.7–15.4)
WBC: 8.9 10*3/uL (ref 3.4–10.8)

## 2018-02-21 LAB — BASIC METABOLIC PANEL
BUN/Creatinine Ratio: 18 (ref 12–28)
BUN: 17 mg/dL (ref 8–27)
CO2: 25 mmol/L (ref 20–29)
Calcium: 9.7 mg/dL (ref 8.7–10.3)
Chloride: 105 mmol/L (ref 96–106)
Creatinine, Ser: 0.93 mg/dL (ref 0.57–1.00)
GFR calc Af Amer: 70 mL/min/{1.73_m2} (ref >59)
GFR calc non Af Amer: 61 mL/min/{1.73_m2} (ref >59)
Glucose: 113 mg/dL — ABNORMAL HIGH (ref 65–99)
Potassium: 4.8 mmol/L (ref 3.5–5.2)
Sodium: 145 mmol/L — ABNORMAL HIGH (ref 134–144)

## 2018-02-21 LAB — PRO B NATRIURETIC PEPTIDE: NT-Pro BNP: 162 pg/mL (ref 0–301)

## 2018-02-24 ENCOUNTER — Other Ambulatory Visit (HOSPITAL_BASED_OUTPATIENT_CLINIC_OR_DEPARTMENT_OTHER): Payer: Self-pay | Admitting: Family

## 2018-02-24 ENCOUNTER — Encounter: Payer: Self-pay | Admitting: Family

## 2018-02-24 ENCOUNTER — Ambulatory Visit (INDEPENDENT_AMBULATORY_CARE_PROVIDER_SITE_OTHER): Payer: Medicare Other | Admitting: Family

## 2018-02-24 ENCOUNTER — Telehealth: Payer: Self-pay | Admitting: Family

## 2018-02-24 VITALS — BP 138/56 | HR 61 | Temp 98.2°F | Resp 16 | Ht 63.0 in | Wt 171.0 lb

## 2018-02-24 DIAGNOSIS — E348 Other specified endocrine disorders: Secondary | ICD-10-CM

## 2018-02-24 DIAGNOSIS — I10 Essential (primary) hypertension: Secondary | ICD-10-CM

## 2018-02-24 DIAGNOSIS — Z0000 Encounter for general adult medical examination without abnormal findings: Secondary | ICD-10-CM | POA: Diagnosis not present

## 2018-02-24 DIAGNOSIS — E039 Hypothyroidism, unspecified: Secondary | ICD-10-CM

## 2018-02-24 DIAGNOSIS — Z1231 Encounter for screening mammogram for malignant neoplasm of breast: Secondary | ICD-10-CM

## 2018-02-24 DIAGNOSIS — Z1211 Encounter for screening for malignant neoplasm of colon: Secondary | ICD-10-CM

## 2018-02-24 DIAGNOSIS — R739 Hyperglycemia, unspecified: Secondary | ICD-10-CM

## 2018-02-24 LAB — HEMOGLOBIN A1C: Hgb A1c MFr Bld: 6.1 % (ref 4.6–6.5)

## 2018-02-24 LAB — TSH: TSH: 5.62 u[IU]/mL — ABNORMAL HIGH (ref 0.35–4.50)

## 2018-02-24 MED ORDER — LEVOTHYROXINE SODIUM 88 MCG PO TABS: 88.0000 ug | ORAL_TABLET | Freq: Every day | ORAL | 3 refills | Status: DC

## 2018-02-24 NOTE — Progress Notes (Signed)
Subjective:    [name] is a 73 y.o. female who presents for Medicare Annual/Subsequent preventive examination.  Preventive Screening-Counseling & Management  Tobacco Social History   Tobacco Use  Smoking Status Never Smoker  Smokeless Tobacco Never Used     Problems Prior to Visit  1. HTN-bp medications include lasix, metoprolol, avapro, aldactone.  BP Readings from Last 3 Encounters:  02/24/18 (!) 138/56  02/20/18 (!) 158/80  02/20/18 134/61   2. Hypothyroid- maintained on synthroid.  Lab Results  Component Value Date   TSH 6.510 (H) 12/07/2017   3. Trigeminal neuralgia- followed by Dr. Naaman Plummer and is maintained on methadone and nucynta.    4. Diastolic heart failure- followed by Dr. Daneen Schick  Immunizations: due for prevnar, flu and shingrix, td Diet:  Needs improvement Exercise: does walk Colonoscopy: due Dexa: due  Pap Smear: n/a due to age 80: due Vision: legally blind in left eye     Current Problems (verified) Patient Active Problem List   Diagnosis Date Noted  . Gait disorder 04/19/2017  . Bilateral calf pain 08/06/2014  . Hearing loss 08/06/2014  . Right shoulder pain 07/29/2014  . Hypothyroidism 03/21/2014  . Dysphagia, pharyngoesophageal phase 03/07/2014  . Hypokalemia 12/16/2012  . Mixed hyperlipidemia 12/16/2012  . Vestibular schwannoma ([place]) 05/10/2011  . Trigeminal neuralgia 05/10/2011  . Injury of left facial nerve 05/10/2011  . PLEURISY 12/25/2008  . FATIGUE 12/25/2008  . Essential hypertension 06/23/2006    Medications Prior to Visit Current Outpatient Medications on File Prior to Visit  Medication Sig Dispense Refill  . BIOTIN 5000 PO Take 1 capsule by mouth daily.     . clorazepate (TRANXENE) 7.5 MG tablet Take 7.5 mg by mouth 2 (two) times daily as needed for anxiety.    . diclofenac sodium (VOLTAREN) 1 % GEL Apply 1 application topically 3 (three) times daily. To left face 3 Tube 4  . estradiol (ESTRACE) 0.5  MG tablet Take 1 tablet by mouth daily. q    . furosemide (LASIX) 40 MG tablet Take 1 tablet (40 mg total) by mouth daily. 90 tablet 3  . irbesartan (AVAPRO) 300 MG tablet Take 1 tablet (300 mg total) by mouth daily. 90 tablet 3  . levothyroxine (SYNTHROID, LEVOTHROID) 75 MCG tablet Take 1 tablet (75 mcg total) by mouth daily. 30 tablet 3  . methadone (DOLOPHINE) 10 MG tablet Take 1 tablet (10 mg total) by mouth daily. 30 tablet 0  . metoprolol succinate (TOPROL XL) 25 MG 24 hr tablet Take 1 tablet (25 mg total) by mouth daily. 30 tablet 6  . Multiple Vitamins-Minerals (PRESERVISION AREDS PO) Take 1 capsule by mouth daily.     Marland Kitchen OVER THE COUNTER MEDICATION COLAGEN POWER.  Take 1 scoopful in liquid daily.    Marland Kitchen spironolactone (ALDACTONE) 25 MG tablet Take 0.5 tablets (12.5 mg total) by mouth daily. 45 tablet 3  . tapentadol (NUCYNTA ER) 50 MG 12 hr tablet Take 1 tablet (50 mg total) by mouth every 12 (twelve) hours as needed (nerve pain). 30 tablet 0   No current facility-administered medications on file prior to visit.     Current Medications (verified) Current Outpatient Medications  Medication Sig Dispense Refill  . BIOTIN 5000 PO Take 1 capsule by mouth daily.     . clorazepate (TRANXENE) 7.5 MG tablet Take 7.5 mg by mouth 2 (two) times daily as needed for anxiety.    . diclofenac sodium (VOLTAREN) 1 % GEL Apply 1 application topically 3 (  three) times daily. To left face 3 Tube 4  . estradiol (ESTRACE) 0.5 MG tablet Take 1 tablet by mouth daily. q    . furosemide (LASIX) 40 MG tablet Take 1 tablet (40 mg total) by mouth daily. 90 tablet 3  . irbesartan (AVAPRO) 300 MG tablet Take 1 tablet (300 mg total) by mouth daily. 90 tablet 3  . levothyroxine (SYNTHROID, LEVOTHROID) 75 MCG tablet Take 1 tablet (75 mcg total) by mouth daily. 30 tablet 3  . methadone (DOLOPHINE) 10 MG tablet Take 1 tablet (10 mg total) by mouth daily. 30 tablet 0  . metoprolol succinate (TOPROL XL) 25 MG 24 hr tablet  Take 1 tablet (25 mg total) by mouth daily. 30 tablet 6  . Multiple Vitamins-Minerals (PRESERVISION AREDS PO) Take 1 capsule by mouth daily.     Marland Kitchen OVER THE COUNTER MEDICATION COLAGEN POWER.  Take 1 scoopful in liquid daily.    Marland Kitchen spironolactone (ALDACTONE) 25 MG tablet Take 0.5 tablets (12.5 mg total) by mouth daily. 45 tablet 3  . tapentadol (NUCYNTA ER) 50 MG 12 hr tablet Take 1 tablet (50 mg total) by mouth every 12 (twelve) hours as needed (nerve pain). 30 tablet 0   No current facility-administered medications for this visit.      Allergies (verified) Morphine   PAST HISTORY  Family History Family History  Problem Relation Age of Onset  . Hypertension Mother   . Transient ischemic attack Mother   . Coronary artery disease Father        CHF  . Coronary artery disease Sister   . Macular degeneration Sister   . Transient ischemic attack Maternal Grandmother   . Diabetes Neg Hx   . Cancer Neg Hx     Social History Social History   Tobacco Use  . Smoking status: Never Smoker  . Smokeless tobacco: Never Used  Substance Use Topics  . Alcohol use: No    Alcohol/week: 0.0 standard drinks     Are there smokers in your home (other than you)? No  Risk Factors Current exercise habits: some exercise  Dietary issues discussed: work on healthier diet   Cardiac risk factors: advanced age (older than 33 for men, 44 for women).  Depression Screen (Note: if answer to either of the following is "Yes", a more complete depression screening is indicated)   Over the past two weeks, have you felt down, depressed or hopeless? No  Over the past two weeks, have you felt little interest or pleasure in doing things? No  Have you lost interest or pleasure in daily life? No  Do you often feel hopeless? No  Do you cry easily over simple problems? No  Activities of Daily Living In your present state of health, do you have any difficulty performing the following activities?:  Driving?  No Managing money?  No Feeding yourself? No Getting from bed to chair? No . Climbing a flight of stairs? No Preparing food and eating?: No Bathing or showering? No Getting dressed: No Getting to the toilet? No Using the toilet:No Moving around from place to place: No In the past year have you fallen or had a near fall?:No   Are you sexually active?  No  Do you have more than one partner?  No  Hearing Difficulties: No Do you often ask people to speak up or repeat themselves? yes Do you experience ringing or noises in your ears? No Do you have difficulty understanding soft or whispered voices? yes  Do you feel that you have a problem with memory? Yes, sometimes forgets names  Do you often misplace items? No  Do you feel safe at home?  yes  Cognitive Testing  Alert? Yes  Normal Appearance?Yes  Oriented to person? Yes  Place? Yes   Time? Yes  Recall of three objects?  Yes  Can perform simple calculations? No  Displays appropriate judgment?Yes  Can read the correct time from a watch face?Yes   Advanced Directives have been discussed with the patient? Yes  List the Names of Other Physician/Practitioners you currently use: 1.    Indicate any recent Medical Services you may have received from other than [facility] providers in the past year (date may be approximate).  Immunization History  Administered Date(s) Administered  . Influenza-Unspecified 11/15/2013  . Pneumococcal-Unspecified 02/15/2013    Screening Tests Health Maintenance  Topic Date Due  . Hepatitis C Screening  [birth year]  . TETANUS/TDAP  12/31/1962  . MAMMOGRAM  12/30/1993  . COLONOSCOPY  12/30/1993  . DEXA SCAN  12/30/2008  . PNA vac Low Risk Adult (2 of 2 - PCV13) 02/15/2014  . INFLUENZA VACCINE  10/17/2018 (Originally 09/15/2017)    All answers were reviewed with the patient and necessary referrals were made:  Nance Pear, NP   02/24/2018   History reviewed: allergies, current medications,  past family history, past medical history, past social history, past surgical history and problem list  Review of Systems Pertinent items are noted in HPI.    Objective:    Visual Acuity Screening   Right eye Left eye Both eyes  Without correction: 20/20 unable 20/20  With correction:       Body mass index is 30.29 kg/m. BP (!) 138/56 (BP Location: Right Arm, Patient Position: Sitting, Cuff Size: Small)   Pulse 61   Temp 98.2 F (36.8 C) (Oral)   Resp 16   Ht 5\' 3"  (1.6 m)   Wt 171 lb (77.6 kg)   SpO2 100%   BMI 30.29 kg/m   Physical Exam  Constitutional: She is oriented to person, place, and time. She appears well-developed and well-nourished. No distress.  HENT:  Head: Normocephalic and atraumatic.  Neck: Normal range of motion. No thyromegaly present.  Cardiovascular: Normal rate and regular rhythm.   No murmur heard. Pulmonary/Chest: Effort normal and breath sounds normal. No respiratory distress. He has no wheezes. She has no rales. She exhibits no tenderness.  Neurological: She is alert and oriented to person, place, and time.She has left sided facial drooping Coordination normal.  Skin: Skin is warm and dry.  Psychiatric: She has a normal mood and affect. Her behavior is normal. Judgment and thought content normal.  Assessment & Plan:     Preventative care- declines immunizations. Agreeable to colo, dexa, mammo.  Pt given copy of  advanced directives form.  HTN- bp acceptable, continue current meds.   Hypothyroid- repeat tsh, was elevated last visit, continue synthroid.  Trigeminal neuralgia- unchanged, management per Dr. Tessa Lerner.   Diastolic heart failure- appears clinically compensated.  Assessment:          Plan:     During the course of the visit the patient was educated and counseled about appropriate screening and preventive services including:    Pneumococcal vaccine ,mammogram, dexa, flu shot, tetanus and shingrix.   Diet review for  nutrition referral? Yes ____  Not Indicated _x___   Patient Instructions (the written plan) was given to the patient.  Medicare Attestation I have  personally reviewed: The patient's medical and social history Their use of alcohol, tobacco or illicit drugs Their current medications and supplements The patient's functional ability including ADLs,fall risks, home safety risks, cognitive, and hearing and visual impairment Diet and physical activities Evidence for depression or mood disorders  The patient's weight, height, BMI, and visual acuity have been recorded in the chart.  I have made referrals, counseling, and provided education to the patient based on review of the above and I have provided the patient with a written personalized care plan for preventive services.     Nance Pear, NP   02/24/2018

## 2018-02-24 NOTE — Patient Instructions (Signed)
Please complete lab work prior to leaving.   

## 2018-02-24 NOTE — Telephone Encounter (Signed)
A1C is up just a touch but still at goal. Synthroid needs to be increased. rx sent for synthroid 88 mcg. Repeat tsh in 6 weeks please.

## 2018-02-27 ENCOUNTER — Other Ambulatory Visit: Payer: Self-pay

## 2018-02-27 DIAGNOSIS — E039 Hypothyroidism, unspecified: Secondary | ICD-10-CM

## 2018-02-27 NOTE — Telephone Encounter (Signed)
Results given to patient's husband on [facility] consent form. Advised of new dose and to call back to schedule tsh in 6 weeks.

## 2018-03-02 ENCOUNTER — Telehealth (HOSPITAL_COMMUNITY): Payer: Self-pay | Admitting: *Deleted

## 2018-03-02 NOTE — Telephone Encounter (Signed)
Left message on voicemail per DPR in reference to upcoming appointment scheduled on 03/06/18 with detailed instructions given per Myocardial Perfusion Study Information Sheet for the test. LM to arrive 15 minutes early, and that it is imperative to arrive on time for appointment to keep from having the test rescheduled. If you need to cancel or reschedule your appointment, please call the office within 24 hours of your appointment. Failure to do so may result in a cancellation of your appointment, and a $50 no show fee. Phone number given for call back for any questions. Christepher Melchior Jacqueline    

## 2018-03-06 ENCOUNTER — Ambulatory Visit (HOSPITAL_COMMUNITY): Payer: Medicare Other | Attending: Cardiology

## 2018-03-06 DIAGNOSIS — R0600 Dyspnea, unspecified: Secondary | ICD-10-CM | POA: Insufficient documentation

## 2018-03-06 DIAGNOSIS — I10 Essential (primary) hypertension: Secondary | ICD-10-CM | POA: Diagnosis not present

## 2018-03-06 LAB — MYOCARDIAL PERFUSION IMAGING
LV dias vol: 53 mL (ref 46–106)
LV sys vol: 11 mL
Peak HR: 137 {beats}/min
Rest HR: 95 {beats}/min
SDS: 0
SRS: 0
SSS: 0
TID: 0.92

## 2018-03-06 MED ORDER — REGADENOSON 0.4 MG/5ML IV SOLN
0.4000 mg | Freq: Once | INTRAVENOUS | Status: AC
  Administered 2018-03-06: 0.4 mg via INTRAVENOUS

## 2018-03-06 MED ORDER — TECHNETIUM TC 99M TETROFOSMIN IV KIT
29.6000 | PACK | Freq: Once | INTRAVENOUS | Status: AC | PRN
  Administered 2018-03-06: 29.6 via INTRAVENOUS
  Filled 2018-03-06: qty 30

## 2018-03-06 MED ORDER — TECHNETIUM TC 99M TETROFOSMIN IV KIT
10.8000 | PACK | Freq: Once | INTRAVENOUS | Status: AC | PRN
  Administered 2018-03-06: 10.8 via INTRAVENOUS
  Filled 2018-03-06: qty 11

## 2018-03-19 NOTE — Progress Notes (Signed)
Cardiology Office Note:    Date:  03/20/2018   ID:  [name], DOB December 22, 1945, MRN [id number]  PCP:  Debbrah Alar, NP  Cardiologist:  Sinclair Grooms, MD   Referring MD: Debbrah Alar, NP   Chief Complaint  Patient presents with  . Irregular Heart Beat    History of Present Illness:    [name] is a 73 y.o. female with a hx of long standing HTN,paroxysmal supraventricular tachycardia treated with ablation in 2005and reported congenital heart disease with "being a blue baby and had valve surgery as a child" with what sounds like thru a thoracotomy.Prior brain tumor with subsequent nerve pain and left sided facial numbness - requiring Methadone.   States that her breathing is improved.  She is taking the medications inconsistently.  No swelling.  No angina.  She complains of symptoms related to her pharmacologic study.  She has never felt similar symptoms before.  Inconsistently taking her medications and is concerned about being placed on additional therapies.  Concerned that PSVT ablation could allow recurrence of arrhythmia over time.  A friend recently had ablation and had recurrent arrhythmia.  I explained this was likely A. fib ablation.     Past Medical History:  Diagnosis Date  . Abnormality of gait   . Brachial neuritis or radiculitis NOS   . Cervicalgia   . Facial nerve disorder   . Hearing loss    Left ear  . 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 Hyperlipidemia   . Hypertension   . Macular degeneration   . Myofascial pain   . Pneumonia 2005  . Thyroid disease    hyprothyroidism  . Trigeminal neuralgia     Past Surgical History:  Procedure Laterality Date  . ABDOMINAL HYSTERECTOMY    . [place]   closed hole in heart  . CATARACT EXTRACTION Left 02/02/2016  . LUMBAR DISC SURGERY     Dr Eddie Dibbles  . no colonoscopy     "scared"; SOC reviewed  . 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;  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    Current Medications: Current Meds  Medication Sig  . clorazepate (TRANXENE) 7.5 MG tablet Take 7.5 mg by mouth 2 (two) times daily as needed for anxiety.  . diclofenac sodium (VOLTAREN) 1 % GEL Apply 1 application topically 3 (three) times daily. To left face  . estradiol (ESTRACE) 0.5 MG tablet Take 1 tablet by mouth daily. q  . furosemide (LASIX) 40 MG tablet Take 1 tablet (40 mg total) by mouth daily.  . irbesartan (AVAPRO) 300 MG tablet Take 1 tablet (300 mg total) by mouth daily.  Marland Kitchen levothyroxine (SYNTHROID, LEVOTHROID) 88 MCG tablet Take 1 tablet (88 mcg total) by mouth daily.  . methadone (DOLOPHINE) 10 MG tablet Take 1 tablet (10 mg total) by mouth daily.  . metoprolol succinate (TOPROL XL) 25 MG 24 hr tablet Take 1 tablet (25 mg total) by mouth daily.  . Multiple Vitamins-Minerals (PRESERVISION AREDS PO) Take 1 capsule by mouth daily.   Marland Kitchen OVER THE COUNTER MEDICATION COLAGEN POWER.  Take 1 scoopful in liquid daily.  Marland Kitchen spironolactone (ALDACTONE) 25 MG tablet Take 0.5 tablets (12.5 mg total) by mouth daily.  . tapentadol (NUCYNTA ER) 50 MG 12 hr tablet Take 1 tablet (50 mg total) by mouth every 12 (twelve) hours as needed (nerve pain).     Allergies:  Morphine   Social History   Socioeconomic History  . Marital status: Married    Spouse name: Not on file  . Number of children: 1  . Years of education: Not on file  . Highest education level: Not on file  Occupational History  . Occupation: Retired  Scientific laboratory technician  . Financial resource strain: Not on file  . Food insecurity:    Worry: Not on file    Inability: Not on file  . Transportation needs:    Medical: Not on file    Non-medical: Not on file  Tobacco Use  . Smoking status: Never Smoker  . Smokeless tobacco: Never Used  Substance and Sexual Activity  . Alcohol use: No    Alcohol/week: 0.0 standard drinks  . Drug use: No  . Sexual activity: Not on file  Lifestyle  . Physical  activity:    Days per week: Not on file    Minutes per session: Not on file  . Stress: Not on file  Relationships  . Social connections:    Talks on phone: Not on file    Gets together: Not on file    Attends religious service: Not on file    Active member of club or organization: Not on file    Attends meetings of clubs or organizations: Not on file    Relationship status: Not on file  Other Topics Concern  . Not on file  Social History Narrative  . Not on file     Family History: The patient's family history includes Coronary artery disease in her father and sister; Hypertension in her mother; Macular degeneration in her sister; Transient ischemic attack in her maternal grandmother and mother. There is no history of Diabetes or Cancer.  ROS:   Please see the history of present illness.    Anxiety, orthopnea, but no edema.  All other systems reviewed and are negative.  EKGs/Labs/Other Studies Reviewed:    The following studies were reviewed today:  Myocardial perfusion imaging March 06, 2018: Study Highlights     The left ventricular ejection fraction is hyperdynamic (>65%).  Nuclear stress EF: 79%.  The study is normal.  This is a low risk study.   Normal stress nuclear study with no ischemia or infarction; EF 79 with normal wall motion.   2D Doppler echocardiogram 12/14/2017: Study Conclusions  - Left ventricle: The cavity size was normal. Systolic function was   normal. The estimated ejection fraction was in the range of 60%   to 65%. Wall motion was normal; there were no regional wall   motion abnormalities. Left ventricular diastolic function   parameters were normal. - Mitral valve: Moderately calcified annulus. Moderately thickened,   moderately calcified leaflets . - Pulmonary arteries: PA peak pressure: 31 mm Hg (S).  EKG:  EKG form February 20, 2018 demonstrates sinus tachycardia at 113 bpm and nonspecific ST-T wave abnormality.  Recent  Labs: 12/07/2017: ALT 12 02/20/2018: BUN 17; Creatinine, Ser 0.93; Hemoglobin 12.9; NT-Pro BNP 162; Platelets 294; Potassium 4.8; Sodium 145 02/24/2018: TSH 5.62  Recent Lipid Panel    Component Value Date/Time   CHOL 260 (H) 12/30/2017 0919   CHOL 252 (H) 12/22/2012 0848   TRIG 261.0 (H) 12/30/2017 0919   TRIG 182 (H) 12/22/2012 0848   HDL 52.50 12/30/2017 0919   HDL 54 12/22/2012 0848   CHOLHDL 5 12/30/2017 0919   VLDL 52.2 (H) 12/30/2017 0919   LDLCALC 155 (H) 08/01/2014 0807   LDLCALC 162 (H) 12/22/2012  0848   LDLDIRECT 186.0 12/30/2017 0919    Physical Exam:    VS:  BP (!) 146/64   Pulse 95   Ht 5\' 3"  (1.6 m)   Wt 172 lb 12.8 oz (78.4 kg)   SpO2 97%   BMI 30.61 kg/m     Wt Readings from Last 3 Encounters:  03/20/18 172 lb 12.8 oz (78.4 kg)  03/06/18 173 lb (78.5 kg)  02/24/18 171 lb (77.6 kg)     GEN: Left facial droop. No acute distress HEENT: Normal NECK: No JVD. LYMPHATICS: No lymphadenopathy CARDIAC: RRR.  No murmur, no gallop, no edema VASCULAR: 2+ radial and brachial, 2+ bilateral carotid pulses, no bruits RESPIRATORY:  Clear to auscultation without rales, wheezing or rhonchi  ABDOMEN: Soft, non-tender, non-distended, No pulsatile mass, MUSCULOSKELETAL: No deformity  SKIN: Warm and dry NEUROLOGIC:  Alert and oriented x 3 PSYCHIATRIC:  Normal affect   ASSESSMENT:    1. Paroxysmal SVT (supraventricular tachycardia) ([place])   2. Acute diastolic heart failure ([place])   3. Essential hypertension   4. Congenital heart disease   5. Mixed hyperlipidemia    PLAN:    In order of problems listed above:  1. No recurrence of PSVT 2. Echo did not demonstrate evidence of abnormal diastolic function. 3. Poor control with increased heart rate.  Start Toprol-XL 25 mg/day.  She has not been taking this medication as prescribed. 4. Not addressed and unknown diagnosis. 5. LDL cholesterol is extremely elevated.  She is not willing to be on therapy.  We will continue to  discuss this over time.  No evidence of ischemia or systolic/diastolic heart failure on evaluation.  Dyspnea is improved now compared to the initial office visit.  Aldactone and beta-blocker therapy was started but she has not taken either consistently.  Discontinue Aldactone.  Take Toprol-XL 25 mg/day along with Aldactone and furosemide as prescribed.  Secondary risk prevention was discussed.  Further management at next visit.  Overall education and awareness concerning primary/secondary risk prevention was discussed in detail: LDL less than 70, hemoglobin A1c less than 7, blood pressure target less than 130/80 mmHg, >150 minutes of moderate aerobic activity per week, avoidance of smoking, weight control (via diet and exercise), and continued surveillance/management of/for obstructive sleep apnea.  We should target an LDL at least of 100 or less.  Return in 6 to 9 months or earlier if dyspnea returns.   Medication Adjustments/Labs and Tests Ordered: Current medicines are reviewed at length with the patient today.  Concerns regarding medicines are outlined above.  No orders of the defined types were placed in this encounter.  No orders of the defined types were placed in this encounter.   There are no Patient Instructions on file for this visit.   Signed, Sinclair Grooms, MD  03/20/2018 11:29 AM    [place]

## 2018-03-20 ENCOUNTER — Ambulatory Visit (INDEPENDENT_AMBULATORY_CARE_PROVIDER_SITE_OTHER): Payer: Medicare Other | Admitting: Interventional Cardiology

## 2018-03-20 ENCOUNTER — Encounter: Payer: Self-pay | Admitting: Interventional Cardiology

## 2018-03-20 VITALS — BP 146/64 | HR 95 | Ht 63.0 in | Wt 172.8 lb

## 2018-03-20 DIAGNOSIS — I5031 Acute diastolic (congestive) heart failure: Secondary | ICD-10-CM

## 2018-03-20 DIAGNOSIS — I471 Supraventricular tachycardia: Secondary | ICD-10-CM | POA: Diagnosis not present

## 2018-03-20 DIAGNOSIS — Q249 Congenital malformation of heart, unspecified: Secondary | ICD-10-CM

## 2018-03-20 DIAGNOSIS — I10 Essential (primary) hypertension: Secondary | ICD-10-CM | POA: Diagnosis not present

## 2018-03-20 DIAGNOSIS — E782 Mixed hyperlipidemia: Secondary | ICD-10-CM | POA: Diagnosis not present

## 2018-03-20 NOTE — Patient Instructions (Signed)
Medication Instructions:  Your physician recommends that you continue on your current medications as directed. Please refer to the Current Medication list given to you today.  If you need a refill on your cardiac medications before your next appointment, please call your pharmacy.   Lab work: None Ordered   Testing/Procedures: None Ordered   Follow-Up: At [facility], you and your health needs are our priority.  As part of our continuing mission to provide you with exceptional heart care, we have created designated Provider Care Teams.  These Care Teams include your primary Cardiologist (physician) and Advanced Practice Providers (APPs -  Physician Assistants and Nurse Practitioners) who all work together to provide you with the care you need, when you need it. You will need a follow up appointment in 6-9 months.  Please call our office 2 months in advance to schedule this appointment.  You may see Henry W Smith III, MD or one of the following Advanced Practice Providers on your designated Care Team:   Lori Gerhardt, NP Laura Ingold, NP . Jill McDaniel, NP   

## 2018-04-07 ENCOUNTER — Encounter: Payer: Self-pay | Admitting: Family

## 2018-04-24 ENCOUNTER — Encounter: Payer: Self-pay | Admitting: Physical Medicine & Rehabilitation

## 2018-04-24 ENCOUNTER — Encounter: Payer: Medicare Other | Attending: Physical Medicine & Rehabilitation | Admitting: Physical Medicine & Rehabilitation

## 2018-04-24 ENCOUNTER — Other Ambulatory Visit: Payer: Self-pay

## 2018-04-24 VITALS — BP 156/76 | HR 78 | Ht 63.0 in | Wt 171.8 lb

## 2018-04-24 DIAGNOSIS — G8929 Other chronic pain: Secondary | ICD-10-CM | POA: Diagnosis not present

## 2018-04-24 DIAGNOSIS — G500 Trigeminal neuralgia: Secondary | ICD-10-CM | POA: Diagnosis not present

## 2018-04-24 DIAGNOSIS — Z5181 Encounter for therapeutic drug level monitoring: Secondary | ICD-10-CM | POA: Diagnosis not present

## 2018-04-24 DIAGNOSIS — G894 Chronic pain syndrome: Secondary | ICD-10-CM | POA: Diagnosis not present

## 2018-04-24 DIAGNOSIS — Z79899 Other long term (current) drug therapy: Secondary | ICD-10-CM

## 2018-04-24 DIAGNOSIS — Z79891 Long term (current) use of opiate analgesic: Secondary | ICD-10-CM | POA: Diagnosis not present

## 2018-04-24 DIAGNOSIS — D333 Benign neoplasm of cranial nerves: Secondary | ICD-10-CM

## 2018-04-24 MED ORDER — TAPENTADOL HCL ER 50 MG PO TB12: 50.0000 mg | ORAL_TABLET | Freq: Two times a day (BID) | ORAL | 0 refills | Status: DC | PRN

## 2018-04-24 MED ORDER — METHADONE HCL 10 MG PO TABS: 10.0000 mg | ORAL_TABLET | Freq: Every day | ORAL | 0 refills | Status: DC

## 2018-04-24 NOTE — Patient Instructions (Signed)
PLEASE FEEL FREE TO CALL OUR OFFICE WITH ANY PROBLEMS OR QUESTIONS ([phone])      

## 2018-04-24 NOTE — Progress Notes (Signed)
Subjective:    Patient ID: [name], female    DOB: 04-12-1945, 73 y.o.   MRN: [id number]  HPI   [name] is here in follow up of her vestibular schwannoma and associated deficits. She feels that she is 'getting worse' because her face is more numb and she thinks that it's harder to enunciate words.  From a pain standpoint, she's been stable. She remains on methadone 10mg  daily with nucynta at night as needed.  She denies any new dizziness or vertigo-like symptoms although her balance always has been affected somewhat by the schwannoma.  Her hearing remains an ongoing problem which we discussed at last visit.  She has not followed up her hearing since I last saw her.  From a standpoint of her work, she states that there is a lot of stress at her job and that she has been asked to work more hours as of late.   Pain Inventory Average Pain 7 Pain Right Now 5 My pain is burning, stabbing and tingling  In the last 24 hours, has pain interfered with the following? General activity 7 Relation with others 9 Enjoyment of life 10 What TIME of day is your pain at its worst? daytime Sleep (in general) Fair  Pain is worse with: talkiing Pain improves with: medication Relief from Meds: 8  Mobility ability to climb steps?  yes do you drive?  yes  Function employed # of hrs/week 25 disabled: date disabled 2005 retired  Neuro/Psych dizziness loss of taste or smell  Prior Studies x-rays CT/MRI  Physicians involved in your care Primary care Dr. Marge Duncans  Orthopedist Dr. Harrison Mons   Family History  Problem Relation Age of Onset  . Hypertension Mother   . Transient ischemic attack Mother   . Coronary artery disease Father        CHF  . Coronary artery disease Sister   . Macular degeneration Sister   . Transient ischemic attack Maternal Grandmother   . Diabetes Neg Hx   . Cancer Neg Hx    Social History   Socioeconomic History  . Marital status: Married   Spouse name: Not on file  . Number of children: 1  . Years of education: Not on file  . Highest education level: Not on file  Occupational History  . Occupation: Retired  Scientific laboratory technician  . Financial resource strain: Not on file  . Food insecurity:    Worry: Not on file    Inability: Not on file  . Transportation needs:    Medical: Not on file    Non-medical: Not on file  Tobacco Use  . Smoking status: Never Smoker  . Smokeless tobacco: Never Used  Substance and Sexual Activity  . Alcohol use: No    Alcohol/week: 0.0 standard drinks  . Drug use: No  . Sexual activity: Not on file  Lifestyle  . Physical activity:    Days per week: Not on file    Minutes per session: Not on file  . Stress: Not on file  Relationships  . Social connections:    Talks on phone: Not on file    Gets together: Not on file    Attends religious service: Not on file    Active member of club or organization: Not on file    Attends meetings of clubs or organizations: Not on file    Relationship status: Not on file  Other Topics Concern  . Not on file  Social History Narrative  .  Not on file   Past Surgical History:  Procedure Laterality Date  . ABDOMINAL HYSTERECTOMY    . [place]   closed hole in heart  . CATARACT EXTRACTION Left 02/02/2016  . LUMBAR DISC SURGERY     Dr Eddie Dibbles  . no colonoscopy     "scared"; SOC reviewed  . 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 Abnormality of gait   . Brachial neuritis or radiculitis NOS   . Cervicalgia   . Facial nerve disorder   . Hearing loss    Left ear  . 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 Hyperlipidemia   . Hypertension   . Macular degeneration   . Myofascial pain   . Pneumonia 2005  . Thyroid disease    hyprothyroidism  . Trigeminal neuralgia    BP (!) 156/76   Pulse 78   Ht 5\' 3"  (1.6 m)   Wt 171 lb 12.8 oz (77.9 kg)   SpO2  96%   BMI 30.43 kg/m   Opioid Risk Score:   Fall Risk Score:  `1  Depression screen PHQ 2/9  Depression screen [facility] 2/9 04/24/2018 10/24/2017 06/20/2017 02/23/2017 12/29/2016 02/04/2016 05/22/2014  Decreased Interest 0 1 0 0 0 0 1  Down, Depressed, Hopeless 0 1 0 0 0 0 0  PHQ - 2 Score 0 2 0 0 0 0 1  Altered sleeping - - - - - - 1  Tired, decreased energy - - - - - - 2  Change in appetite - - - - - - 0  Feeling bad or failure about yourself  - - - - - - 0  Trouble concentrating - - - - - - 1  Moving slowly or fidgety/restless - - - - - - 0  Suicidal thoughts - - - - - - 0  PHQ-9 Score - - - - - - 5   Review of Systems  Constitutional: Positive for unexpected weight change.  HENT: Negative.   Eyes: Negative.   Respiratory: Negative.   Cardiovascular: Negative.   Gastrointestinal: Positive for constipation.  Endocrine: Negative.   Genitourinary: Positive for difficulty urinating.  Musculoskeletal: Negative.   Skin: Negative.   Allergic/Immunologic: Negative.   Neurological: Positive for dizziness.  Hematological: Negative.   Psychiatric/Behavioral: Negative.   All other systems reviewed and are negative.      Objective:   Physical Exam General: No acute distress HEENT: EOMI, oral membranes moist Cards: reg rate  Chest: normal effort Abdomen: Soft, NT, ND Skin: dry, intact Extremities: no edema  Musculoskeletal:  Normal neck ROM Neurological: She is alert and oriented to person, place, and time. Left lid lag and facial droop essentially unchanged.Marland Kitchen  Psychiatric:Very pleasant, slightly anxious.   Assessment & Plan:  ASSESSMENT:  1. History of vestibular schwannoma resulting in trigeminal neuralgia and left facial nerve injury- no tumor regrowth per MRI--has chronic peri-operative scarring and atrophy which accounts for weakness, pain, and balance issues.  -stable at present. 2. History of cervicalgia and lumbar spine surgery---recent low back  strain? 3. Chronic pain related to CN 5 involvement  4. Hypothyroid on supplementation  5. Right shoulder pain resolved.  6. Reactive depression and anxiety     PLAN:  1. Methadone 10 mg daily, #30refilled.RF today     Nucynta 50 mg 1 p.o. q.12 h. p.r.n.  30.RF  -We will continue the controlled substance monitoring program, this consists of regular clinic visits, examinations, routine drug screening, pill counts as well as use of [place] Controlled Substance Reporting System. [place] was reviewed today.   -Medication was refilled and a second prescription was sent to the patient's pharmacy for next month.   2.SOB/pneumonia: per [employer] cards. 3.Hypothyroid: Continue supplementation. Really needs a full thyroid battery at some point soon.. 4.continue with voltaren gel, topicals. 5.Eye mgt per optho/ENT--stable.  she will have her hearing checked this Spring.  I reiterated that she needs to have someone look at her hearing again. 6.Follow up with me in2 months. 75minutes of face to face patient care time were spent during this visit. All questions were encouraged and answered.    Follow up MRI of brain later this year, perhaps in the early summer.  I reassured her that I do not see anything dramatically different on her examination.  Furthermore if she had significant recurrence of her tumor she would be experiencing much more significant vestibular symptoms which she is not seeing right now.  Some of the changes she notes are related to chronic injuries to her facial nerve.

## 2018-04-29 LAB — DRUG TOX MONITOR 1 W/CONF, ORAL FLD
Alprazolam: NEGATIVE ng/mL (ref <0.50)
Amphetamines: NEGATIVE ng/mL (ref <10)
Barbiturates: NEGATIVE ng/mL (ref <10)
Benzodiazepines: POSITIVE ng/mL — AB (ref <0.50)
Buprenorphine: NEGATIVE ng/mL (ref <0.10)
Chlordiazepoxide: NEGATIVE ng/mL (ref <0.50)
Clonazepam: NEGATIVE ng/mL (ref <0.50)
Cocaine: NEGATIVE ng/mL (ref <5.0)
Diazepam: NEGATIVE ng/mL (ref <0.50)
EDDP: NEGATIVE ng/mL (ref <5.0)
Fentanyl: NEGATIVE ng/mL (ref <0.10)
Flunitrazepam: NEGATIVE ng/mL (ref <0.50)
Flurazepam: NEGATIVE ng/mL (ref <0.50)
Heroin Metabolite: NEGATIVE ng/mL (ref <1.0)
Lorazepam: NEGATIVE ng/mL (ref <0.50)
MARIJUANA: NEGATIVE ng/mL (ref <2.5)
MDMA: NEGATIVE ng/mL (ref <10)
Meprobamate: NEGATIVE ng/mL (ref <2.5)
Methadone: 99.3 ng/mL — ABNORMAL HIGH (ref <5.0)
Methadone: POSITIVE ng/mL — AB (ref <5.0)
Midazolam: NEGATIVE ng/mL (ref <0.50)
Nicotine Metabolite: NEGATIVE ng/mL (ref <5.0)
Nordiazepam: 1.58 ng/mL — ABNORMAL HIGH (ref <0.50)
OPIATES: NEGATIVE ng/mL (ref <2.5)
Oxazepam: NEGATIVE ng/mL (ref <0.50)
PHENCYCLIDINE: NEGATIVE ng/mL (ref <10)
Tapentadol: NEGATIVE ng/mL (ref <5.0)
Temazepam: NEGATIVE ng/mL (ref <0.50)
Tramadol: NEGATIVE ng/mL (ref <5.0)
Triazolam: NEGATIVE ng/mL (ref <0.50)
Zolpidem: NEGATIVE ng/mL (ref <5.0)

## 2018-04-29 LAB — DRUG TOX ALC METAB W/CON, ORAL FLD: ALCOHOL METABOLITE: NEGATIVE ng/mL (ref <25)

## 2018-05-03 ENCOUNTER — Telehealth: Payer: Self-pay | Admitting: *Deleted

## 2018-05-03 NOTE — Telephone Encounter (Signed)
Oral swab drug screen was consistent for prescribed medications.  Nucynta is taken only at night prn.

## 2018-06-02 ENCOUNTER — Encounter (INDEPENDENT_AMBULATORY_CARE_PROVIDER_SITE_OTHER): Payer: Medicare Other | Admitting: Ophthalmology

## 2018-06-28 ENCOUNTER — Encounter: Payer: Medicare Other | Attending: Physical Medicine & Rehabilitation | Admitting: Physical Medicine & Rehabilitation

## 2018-06-28 ENCOUNTER — Other Ambulatory Visit: Payer: Self-pay

## 2018-06-28 ENCOUNTER — Encounter: Payer: Self-pay | Admitting: Physical Medicine & Rehabilitation

## 2018-06-28 ENCOUNTER — Encounter: Payer: Medicare Other | Admitting: Physical Medicine & Rehabilitation

## 2018-06-28 VITALS — BP 147/71 | HR 83 | Temp 96.3°F | Ht 63.0 in | Wt 172.0 lb

## 2018-06-28 DIAGNOSIS — G500 Trigeminal neuralgia: Secondary | ICD-10-CM | POA: Diagnosis not present

## 2018-06-28 DIAGNOSIS — D333 Benign neoplasm of cranial nerves: Secondary | ICD-10-CM

## 2018-06-28 DIAGNOSIS — G894 Chronic pain syndrome: Secondary | ICD-10-CM | POA: Diagnosis not present

## 2018-06-28 DIAGNOSIS — G8929 Other chronic pain: Secondary | ICD-10-CM | POA: Insufficient documentation

## 2018-06-28 MED ORDER — TAPENTADOL HCL 50 MG PO TABS: 50.0000 mg | ORAL_TABLET | Freq: Two times a day (BID) | ORAL | 0 refills | Status: DC | PRN

## 2018-06-28 MED ORDER — METHADONE HCL 10 MG PO TABS: 10.0000 mg | ORAL_TABLET | Freq: Every day | ORAL | 0 refills | Status: DC

## 2018-06-28 NOTE — Progress Notes (Signed)
Subjective:    Patient ID: [name], female    DOB: 05/21/1945, 73 y.o.   MRN: [id number]  HPI   Due to national recommendations of social distancing because of COVID 80, an audio/video tele-health visit is felt to be the most appropriate encounter for this patient at this time. See MyChart message from today for the patient's consent to a tele-health encounter with [place]. This is a follow up telephone visit for the patient who is at home. MD is at office.    I am meeting with the patient today regarding her vestibular schwannoma and associated pain and deficits.  She has been off a little bit  Pain Inventory Average Pain 7 Pain Right Now 5 My pain is sharp, burning and tingling  In the last 24 hours, has pain interfered with the following? General activity 7 Relation with others 9 Enjoyment of life 9 What TIME of day is your pain at its worst? daytime Sleep (in general) Good  Pain is worse with: some activites and talking Pain improves with: heat/ice and medication Relief from Meds: 8  Mobility walk without assistance ability to climb steps?  yes do you drive?  yes transfers alone  Function employed # of hrs/week 25 disabled: date disabled 2005 retired I need assistance with the following:  household duties and shopping  Neuro/Psych trouble walking loss of taste or smell  Prior Studies Any changes since last visit?  no  Physicians involved in your care Any changes since last visit?  no   Family History  Problem Relation Age of Onset  . Hypertension Mother   . Transient ischemic attack Mother   . Coronary artery disease Father        CHF  . Coronary artery disease Sister   . Macular degeneration Sister   . Transient ischemic attack Maternal Grandmother   . Diabetes Neg Hx   . Cancer Neg Hx    Social History   Socioeconomic History  . Marital status: Married    Spouse name: Not on file  . Number of  children: 1  . Years of education: Not on file  . Highest education level: Not on file  Occupational History  . Occupation: Retired  Scientific laboratory technician  . Financial resource strain: Not on file  . Food insecurity:    Worry: Not on file    Inability: Not on file  . Transportation needs:    Medical: Not on file    Non-medical: Not on file  Tobacco Use  . Smoking status: Never Smoker  . Smokeless tobacco: Never Used  Substance and Sexual Activity  . Alcohol use: No    Alcohol/week: 0.0 standard drinks  . Drug use: No  . Sexual activity: Not on file  Lifestyle  . Physical activity:    Days per week: Not on file    Minutes per session: Not on file  . Stress: Not on file  Relationships  . Social connections:    Talks on phone: Not on file    Gets together: Not on file    Attends religious service: Not on file    Active member of club or organization: Not on file    Attends meetings of clubs or organizations: Not on file    Relationship status: Not on file  Other Topics Concern  . Not on file  Social History Narrative  . Not on file   Past Surgical History:  Procedure Laterality Date  .  ABDOMINAL HYSTERECTOMY    . [place]   closed hole in heart  . CATARACT EXTRACTION Left 02/02/2016  . LUMBAR DISC SURGERY     Dr Eddie Dibbles  . no colonoscopy     "scared"; SOC reviewed  . 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 Abnormality of gait   . Brachial neuritis or radiculitis NOS   . Cervicalgia   . Facial nerve disorder   . Hearing loss    Left ear  . 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 Hyperlipidemia   . Hypertension   . Macular degeneration   . Myofascial pain   . Pneumonia 2005  . Thyroid disease    hyprothyroidism  . Trigeminal neuralgia    There were no vitals taken for this visit.  Opioid Risk Score:   Fall Risk Score:  `1  Depression screen PHQ 2/9   Depression screen [facility] 2/9 04/24/2018 10/24/2017 06/20/2017 02/23/2017 12/29/2016 02/04/2016 05/22/2014  Decreased Interest 0 1 0 0 0 0 1  Down, Depressed, Hopeless 0 1 0 0 0 0 0  PHQ - 2 Score 0 2 0 0 0 0 1  Altered sleeping - - - - - - 1  Tired, decreased energy - - - - - - 2  Change in appetite - - - - - - 0  Feeling bad or failure about yourself  - - - - - - 0  Trouble concentrating - - - - - - 1  Moving slowly or fidgety/restless - - - - - - 0  Suicidal thoughts - - - - - - 0  PHQ-9 Score - - - - - - 5     Review of Systems  Constitutional: Positive for unexpected weight change.  HENT: Negative.   Eyes: Negative.   Respiratory: Negative.   Cardiovascular: Positive for leg swelling.  Gastrointestinal: Positive for constipation.  Endocrine: Negative.   Genitourinary: Positive for difficulty urinating.  Musculoskeletal: Positive for gait problem and joint swelling.  Skin: Negative.   Allergic/Immunologic: Negative.   Neurological: Positive for facial asymmetry and speech difficulty.  Hematological: Negative.   Psychiatric/Behavioral: Negative.   All other systems reviewed and are negative.         Assessment & Plan:  ASSESSMENT:  1. History of vestibular schwannoma resulting in trigeminal neuralgia and left facial nerve injury- no tumor regrowth per MRI--has chronic peri-operative scarring and atrophy which accounts for weakness, pain, and balance issues.  -stable at present. 2. History of cervicalgia and lumbar spine surgery---recent low back strain? 3. Chronic pain related to CN 5 involvement  4. Hypothyroid on supplementation  5. Right shoulder pain resolved.  6. Reactive depression and anxiety     PLAN:  1. Methadone 10 mg daily, #30refilled.RF today Nucynta 50 mg 1 p.o. q.12 h. p.r.n. 30.RF -We will continue the controlled substance monitoring program, this consists of regular clinic visits, examinations, routine drug screening, pill counts as  well as use of [place] Controlled Substance Reporting System. [place] was reviewed today.   -Medication was refilled and a second prescription was sent to the patient's pharmacy for next month.     2.SOB/pneumonia: per [employer] cards. 3.Hypothyroid: Continue supplementation. Really needs a full thyroid battery at some point soon.. 4.continue with voltaren gel, topicals. 5.Eye mgt per optho/ENT--stable. follow up this year 6.Follow up  with me in2 months. 77minutes of face to face patient care time were spent during this visit. All questions were encouraged and answered.    Consider folllow up MRI of brain this summer or Fall. It doesn't appear to be worsening at present  11 minutes of tele-visit time was spent with this patient today. Follow up in 2  months

## 2018-07-03 DIAGNOSIS — I10 Essential (primary) hypertension: Secondary | ICD-10-CM | POA: Diagnosis not present

## 2018-07-03 DIAGNOSIS — Z79890 Hormone replacement therapy (postmenopausal): Secondary | ICD-10-CM | POA: Diagnosis not present

## 2018-07-25 ENCOUNTER — Other Ambulatory Visit: Payer: Self-pay | Admitting: Family

## 2018-07-25 DIAGNOSIS — E039 Hypothyroidism, unspecified: Secondary | ICD-10-CM

## 2018-07-25 NOTE — Telephone Encounter (Signed)
I sent a 30 day supply of thyroid medication but she is past due to recheck her TSH. Please schedule pt for lab visit for TSH draw.

## 2018-07-27 ENCOUNTER — Telehealth: Payer: Self-pay

## 2018-07-27 NOTE — Telephone Encounter (Signed)
Spoke with patient about setting up a lab visit to draw TSH. Pt states she is currently working and doesn't know when would be a good day. Pt states she will check with work and call us back. Pt was informed that she needs to have appointment to have labs drawn.

## 2018-08-09 ENCOUNTER — Encounter: Payer: Medicare Other | Attending: Physical Medicine & Rehabilitation | Admitting: Physical Medicine & Rehabilitation

## 2018-08-09 ENCOUNTER — Other Ambulatory Visit: Payer: Self-pay

## 2018-08-09 ENCOUNTER — Encounter: Payer: Self-pay | Admitting: Physical Medicine & Rehabilitation

## 2018-08-09 VITALS — BP 151/80 | HR 85 | Temp 98.7°F | Ht 63.0 in | Wt 175.0 lb

## 2018-08-09 DIAGNOSIS — D333 Benign neoplasm of cranial nerves: Secondary | ICD-10-CM | POA: Diagnosis not present

## 2018-08-09 DIAGNOSIS — G894 Chronic pain syndrome: Secondary | ICD-10-CM

## 2018-08-09 DIAGNOSIS — G8929 Other chronic pain: Secondary | ICD-10-CM | POA: Diagnosis not present

## 2018-08-09 DIAGNOSIS — G500 Trigeminal neuralgia: Secondary | ICD-10-CM | POA: Diagnosis not present

## 2018-08-09 MED ORDER — METHADONE HCL 10 MG PO TABS: 10.0000 mg | ORAL_TABLET | Freq: Every day | ORAL | 0 refills | Status: DC

## 2018-08-09 MED ORDER — TAPENTADOL HCL 50 MG PO TABS: 50.0000 mg | ORAL_TABLET | Freq: Two times a day (BID) | ORAL | 0 refills | Status: DC | PRN

## 2018-08-09 NOTE — Progress Notes (Signed)
Subjective:    Patient ID: [name], female    DOB: 07/08/1945, 73 y.o.   MRN: [id number]  HPI   [name] is here in follow up of her chronic pain and gait disorder. She has remained fairly stable over the last few months albeit a little stressed given our social situation. 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 Hotel is taking approprite precautions from a COVID standpoint.   She remains on methadone for pain control with nucynta at night for more severe pain when it interferes with her sleep.    Pain Inventory Average Pain 8 Pain Right Now 4 My pain is sharp, burning, dull and tingling  In the last 24 hours, has pain interfered with the following? General activity 5 Relation with others 8 Enjoyment of life 9 What TIME of day is your pain at its worst? evening Sleep (in general) Good  Pain is worse with: . Pain improves with: rest, heat/ice and medication Relief from Meds: 8  Mobility walk without assistance ability to climb steps?  yes do you drive?  yes  Function employed # of hrs/week 25 I need assistance with the following:  household duties and shopping  Neuro/Psych numbness tingling trouble walking loss of taste or smell  Prior Studies Any changes since last visit?  no  Physicians involved in your care Any changes since last visit?  no   Family History  Problem Relation Age of Onset  . Hypertension Mother   . Transient ischemic attack Mother   . Coronary artery disease Father        CHF  . Coronary artery disease Sister   . Macular degeneration Sister   . Transient ischemic attack Maternal Grandmother   . Diabetes Neg Hx   . Cancer Neg Hx    Social History   Socioeconomic History  . Marital status: Married    Spouse name: Not on file  . Number of children: 1  . Years of education: Not on file  . Highest education level: Not on file  Occupational History  . Occupation: Retired  Photographer  . Financial resource strain: Not on file  . Food insecurity    Worry: Not on file    Inability: Not on file  . Transportation needs    Medical: Not on file    Non-medical: Not on file  Tobacco Use  . Smoking status: Never Smoker  . Smokeless tobacco: Never Used  Substance and Sexual Activity  . Alcohol use: No    Alcohol/week: 0.0 standard drinks  . Drug use: No  . Sexual activity: Not on file  Lifestyle  . Physical activity    Days per week: Not on file    Minutes per session: Not on file  . Stress: Not on file  Relationships  . Social Herbalist on phone: Not on file    Gets together: Not on file    Attends religious service: Not on file    Active member of club or organization: Not on file    Attends meetings of clubs or organizations: Not on file    Relationship status: Not on file  Other Topics Concern  . Not on file  Social History Narrative  . Not on file   Past Surgical History:  Procedure Laterality Date  . ABDOMINAL HYSTERECTOMY    . [place]   closed hole in heart  . CATARACT  EXTRACTION Left 02/02/2016  . LUMBAR DISC SURGERY     Dr Eddie Dibbles  . no colonoscopy     "scared"; SOC reviewed  . 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 Abnormality of gait   . Brachial neuritis or radiculitis NOS   . Cervicalgia   . Facial nerve disorder   . Hearing loss    Left ear  . 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 Hyperlipidemia   . Hypertension   . Macular degeneration   . Myofascial pain   . Pneumonia 2005  . Thyroid disease    hyprothyroidism  . Trigeminal neuralgia    There were no vitals taken for this visit.  Opioid Risk Score:   Fall Risk Score:  `1  Depression screen PHQ 2/9  Depression screen [facility] 2/9 04/24/2018 10/24/2017 06/20/2017 02/23/2017 12/29/2016 02/04/2016 05/22/2014  Decreased Interest 0 1 0 0 0 0 1  Down, Depressed,  Hopeless 0 1 0 0 0 0 0  PHQ - 2 Score 0 2 0 0 0 0 1  Altered sleeping - - - - - - 1  Tired, decreased energy - - - - - - 2  Change in appetite - - - - - - 0  Feeling bad or failure about yourself  - - - - - - 0  Trouble concentrating - - - - - - 1  Moving slowly or fidgety/restless - - - - - - 0  Suicidal thoughts - - - - - - 0  PHQ-9 Score - - - - - - 5     Review of Systems  Constitutional: Positive for unexpected weight change.  HENT: Negative.   Eyes: Negative.   Respiratory: Negative.   Cardiovascular: Negative.   Gastrointestinal: Positive for constipation.  Endocrine: Negative.   Genitourinary: Positive for difficulty urinating.  Musculoskeletal: Positive for gait problem and joint swelling.  Skin: Negative.   Allergic/Immunologic: Negative.   Neurological: Positive for facial asymmetry and numbness.  Hematological: Negative.   Psychiatric/Behavioral: Negative.   All other systems reviewed and are negative.      Objective:   Physical Exam General: No acute distress HEENT: EOMI, oral membranes moist Cards: reg rate  Chest: normal effort Abdomen: Soft, NT, ND Skin: dry, intact Extremities: no edema  Neuro: left facial weakness and left lid lag. Left face remains tender to palpation. Cognitively intact. Fair balance, strength 5/5       Assessment & Plan:  ASSESSMENT:  1. History of vestibular schwannoma resulting in trigeminal neuralgia and left facial nerve injury- no tumor regrowth per MRI--has chronic peri-operative scarring and atrophy which accounts for weakness, pain, and balance issues.  -stable at present. 2. History of cervicalgia and lumbar spine surgery---recent low back strain? 3. Chronic pain related to CN 5 involvement  4. Hypothyroid on supplementation  5. Right shoulder pain resolved.  6. Reactive depression and anxiety     PLAN:  1. Methadone 10 mg daily, #30refilled.RF today Nucynta 50 mg 1 p.o. q.12 h. p.r.n.  30.RF -We will continue the controlled substance monitoring program, this consists of regular clinic visits, examinations, routine drug screening, pill counts as well as use of [place] Controlled Substance Reporting System. [place] was [place] today. -Medication was refilled and a second prescription for methadone was sent to the patient's pharmacy for next month.   2.SOB/pneumonia:  per [employer] cards. 3.Hypothyroid: Continue supplementation. repeat TFT's at some point 4.F/u MRI perhaps later this year or in Spring. I really don't see indication to proceed with one right now.  5.Eye mgt per optho/ENT--stable.follow up this year 6.Follow up with me in2 months. 15 minutes of face to face patient care time were spent during this visit. All questions were encouraged and answered.

## 2018-08-09 NOTE — Patient Instructions (Signed)
PLEASE FEEL FREE TO CALL OUR OFFICE WITH ANY PROBLEMS OR QUESTIONS ([phone])      

## 2018-10-11 ENCOUNTER — Encounter: Payer: Medicare Other | Attending: Physical Medicine & Rehabilitation | Admitting: Physical Medicine & Rehabilitation

## 2018-10-11 DIAGNOSIS — D333 Benign neoplasm of cranial nerves: Secondary | ICD-10-CM | POA: Insufficient documentation

## 2018-10-11 DIAGNOSIS — G8929 Other chronic pain: Secondary | ICD-10-CM | POA: Insufficient documentation

## 2018-10-17 ENCOUNTER — Other Ambulatory Visit: Payer: Self-pay

## 2018-10-17 ENCOUNTER — Encounter (INDEPENDENT_AMBULATORY_CARE_PROVIDER_SITE_OTHER): Payer: Medicare Other | Admitting: Ophthalmology

## 2018-10-17 DIAGNOSIS — H35033 Hypertensive retinopathy, bilateral: Secondary | ICD-10-CM

## 2018-10-17 DIAGNOSIS — H43813 Vitreous degeneration, bilateral: Secondary | ICD-10-CM

## 2018-10-17 DIAGNOSIS — H353132 Nonexudative age-related macular degeneration, bilateral, intermediate dry stage: Secondary | ICD-10-CM | POA: Diagnosis not present

## 2018-10-17 DIAGNOSIS — I10 Essential (primary) hypertension: Secondary | ICD-10-CM | POA: Diagnosis not present

## 2018-10-19 ENCOUNTER — Encounter: Payer: Self-pay | Admitting: Registered Nurse

## 2018-10-19 ENCOUNTER — Other Ambulatory Visit: Payer: Self-pay

## 2018-10-19 ENCOUNTER — Encounter: Payer: Medicare Other | Attending: Physical Medicine & Rehabilitation | Admitting: Registered Nurse

## 2018-10-19 VITALS — BP 156/74 | HR 75 | Temp 98.5°F | Ht 63.0 in | Wt 172.4 lb

## 2018-10-19 DIAGNOSIS — Z79899 Other long term (current) drug therapy: Secondary | ICD-10-CM | POA: Diagnosis not present

## 2018-10-19 DIAGNOSIS — G500 Trigeminal neuralgia: Secondary | ICD-10-CM | POA: Diagnosis not present

## 2018-10-19 DIAGNOSIS — Z5181 Encounter for therapeutic drug level monitoring: Secondary | ICD-10-CM | POA: Diagnosis not present

## 2018-10-19 DIAGNOSIS — G8929 Other chronic pain: Secondary | ICD-10-CM | POA: Diagnosis not present

## 2018-10-19 DIAGNOSIS — G894 Chronic pain syndrome: Secondary | ICD-10-CM | POA: Diagnosis not present

## 2018-10-19 DIAGNOSIS — D333 Benign neoplasm of cranial nerves: Secondary | ICD-10-CM | POA: Diagnosis not present

## 2018-10-19 MED ORDER — METHADONE HCL 10 MG PO TABS: 10.0000 mg | ORAL_TABLET | Freq: Every day | ORAL | 0 refills | Status: DC

## 2018-10-19 MED ORDER — TAPENTADOL HCL 50 MG PO TABS: 50.0000 mg | ORAL_TABLET | Freq: Two times a day (BID) | ORAL | 0 refills | Status: DC | PRN

## 2018-10-19 NOTE — Progress Notes (Signed)
Subjective:    Patient ID: [name], female    DOB: Jan 16, 1946, 73 y.o.   MRN: [id number]  HPI: [name] is a 73 y.o. female who returns for follow up appointment for chronic pain and medication refill. She states her pain is located left facial pain with numbness and burning. She rates her pain 4. Her current exercise regime is walking.  [name] reports she is working 20 hours a week at the [employer].   [name] equivalent is 70.00 MME. Last Oral Swab was Performed on 04/24/2018, it was consistent.   Pain Inventory Average Pain 8 Pain Right Now 4 My pain is burning, dull and tingling  In the last 24 hours, has pain interfered with the following? General activity 8 Relation with others 7 Enjoyment of life 9 What TIME of day is your pain at its worst? daytime and evening  Sleep (in general) Good  Pain is worse with: talking Pain improves with: rest, heat/ice and medication Relief from Meds: 8  Mobility walk without assistance ability to climb steps?  yes do you drive?  yes transfers alone Do you have any goals in this area?  no  Function employed # of hrs/week 20 disabled: date disabled 2005 retired I need assistance with the following:  household duties and shopping Do you have any goals in this area?  no  Neuro/Psych numbness tremor anxiety loss of taste or smell  Prior Studies Any changes since last visit?  no  Physicians involved in your care Any changes since last visit?  no   Family History  Problem Relation Age of Onset  . Hypertension Mother   . Transient ischemic attack Mother   . Coronary artery disease Father        CHF  . Coronary artery disease Sister   . Macular degeneration Sister   . Transient ischemic attack Maternal Grandmother   . Diabetes Neg Hx   . Cancer Neg Hx    Social History   Socioeconomic History  . Marital status: Married    Spouse name: Not on file  . Number of  children: 1  . Years of education: Not on file  . Highest education level: Not on file  Occupational History  . Occupation: Retired  Scientific laboratory technician  . Financial resource strain: Not on file  . Food insecurity    Worry: Not on file    Inability: Not on file  . Transportation needs    Medical: Not on file    Non-medical: Not on file  Tobacco Use  . Smoking status: Never Smoker  . Smokeless tobacco: Never Used  Substance and Sexual Activity  . Alcohol use: No    Alcohol/week: 0.0 standard drinks  . Drug use: No  . Sexual activity: Not on file  Lifestyle  . Physical activity    Days per week: Not on file    Minutes per session: Not on file  . Stress: Not on file  Relationships  . Social Herbalist on phone: Not on file    Gets together: Not on file    Attends religious service: Not on file    Active member of club or organization: Not on file    Attends meetings of clubs or organizations: Not on file    Relationship status: Not on file  Other Topics Concern  . Not on file  Social History Narrative  . Not on file   Past Surgical  History:  Procedure Laterality Date  . ABDOMINAL HYSTERECTOMY    . [place]   closed hole in heart  . CATARACT EXTRACTION Left 02/02/2016  . LUMBAR DISC SURGERY     Dr Eddie Dibbles  . no colonoscopy     "scared"; SOC reviewed  . 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 Abnormality of gait   . Brachial neuritis or radiculitis NOS   . Cervicalgia   . Facial nerve disorder   . Hearing loss    Left ear  . 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 Hyperlipidemia   . Hypertension   . Macular degeneration   . Myofascial pain   . Pneumonia 2005  . Thyroid disease    hyprothyroidism  . Trigeminal neuralgia    BP (!) 156/74 (BP Location: Left Arm)   Pulse 75   Temp 98.5 F (36.9 C)   Ht 5\' 3"  (1.6 m)   Wt 172 lb 6.4 oz (78.2 kg)    SpO2 96%   BMI 30.54 kg/m   Opioid Risk Score:   Fall Risk Score:  `1  Depression screen PHQ 2/9  Depression screen [facility] 2/9 04/24/2018 10/24/2017 06/20/2017 02/23/2017 12/29/2016 02/04/2016 05/22/2014  Decreased Interest 0 1 0 0 0 0 1  Down, Depressed, Hopeless 0 1 0 0 0 0 0  PHQ - 2 Score 0 2 0 0 0 0 1  Altered sleeping - - - - - - 1  Tired, decreased energy - - - - - - 2  Change in appetite - - - - - - 0  Feeling bad or failure about yourself  - - - - - - 0  Trouble concentrating - - - - - - 1  Moving slowly or fidgety/restless - - - - - - 0  Suicidal thoughts - - - - - - 0  PHQ-9 Score - - - - - - 5    Review of Systems  Constitutional: Negative.   HENT: Negative.   Eyes: Negative.   Respiratory: Negative.   Cardiovascular: Negative.   Gastrointestinal: Positive for constipation.  Endocrine: Negative.   Genitourinary: Negative.   Musculoskeletal: Positive for joint swelling.  Skin: Negative.   Allergic/Immunologic: Negative.   Neurological: Positive for weakness.  Hematological: Negative.   Psychiatric/Behavioral: The patient is nervous/anxious.   All other systems reviewed and are negative.      Objective:   Physical Exam Vitals signs and nursing note reviewed.  Constitutional:      Appearance: Normal appearance.  Neck:     Musculoskeletal: Normal range of motion and neck supple.  Cardiovascular:     Rate and Rhythm: Normal rate and regular rhythm.     Pulses: Normal pulses.     Heart sounds: Normal heart sounds.  Pulmonary:     Effort: Pulmonary effort is normal.     Breath sounds: Normal breath sounds.  Musculoskeletal:     Comments: Normal Muscle Bulk and Muscle Testing Reveals:  Upper Extremities: Full ROM and Muscle Strength 5/5 Lower Extremities: Full ROM and Muscle Strength 5/5 Arises from chair with ease Narrow Based Gait   Skin:    General: Skin is warm and dry.  Neurological:     Mental Status: She is alert and oriented to person, place, and time.   Psychiatric:  Mood and Affect: Mood normal.        Behavior: Behavior normal.           Assessment & Plan:  1. History of vestibular schwannoma resulting in trigeminal neuralgia and left facial nerve injury. 10/19/2018. Refilled: Methadone 10 mg daily #30, second script sent for the following month and Nucunta 50 mg one tablet every 12 hours as needed #30.  We will continue the opioid monitoring program,this consists of regular clinic visits, examinations, urine drug screen, pill counts as well use of The [place] Controlled Substance Reporting  2. History of cervicalgia: No complaints today. Continue HEP and Continue to Monitor. 10/19/2018 3. 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 Continue to monitor.    15 minutes of face to face patient care time was spent during this visit. All questions were encouraged and answered.   F/U in 2 months

## 2018-12-20 ENCOUNTER — Encounter: Payer: Self-pay | Admitting: Physical Medicine & Rehabilitation

## 2018-12-20 ENCOUNTER — Other Ambulatory Visit: Payer: Self-pay

## 2018-12-20 ENCOUNTER — Encounter: Payer: Medicare Other | Attending: Physical Medicine & Rehabilitation | Admitting: Physical Medicine & Rehabilitation

## 2018-12-20 VITALS — BP 154/79 | HR 76 | Temp 97.5°F | Ht 63.0 in | Wt 171.0 lb

## 2018-12-20 DIAGNOSIS — Z79899 Other long term (current) drug therapy: Secondary | ICD-10-CM | POA: Insufficient documentation

## 2018-12-20 DIAGNOSIS — G500 Trigeminal neuralgia: Secondary | ICD-10-CM | POA: Diagnosis not present

## 2018-12-20 DIAGNOSIS — G894 Chronic pain syndrome: Secondary | ICD-10-CM | POA: Insufficient documentation

## 2018-12-20 DIAGNOSIS — Z5181 Encounter for therapeutic drug level monitoring: Secondary | ICD-10-CM

## 2018-12-20 DIAGNOSIS — D333 Benign neoplasm of cranial nerves: Secondary | ICD-10-CM | POA: Insufficient documentation

## 2018-12-20 DIAGNOSIS — G8929 Other chronic pain: Secondary | ICD-10-CM | POA: Diagnosis not present

## 2018-12-20 MED ORDER — METHADONE HCL 10 MG PO TABS: 10.0000 mg | ORAL_TABLET | Freq: Every day | ORAL | 0 refills | Status: DC

## 2018-12-20 NOTE — Progress Notes (Signed)
Subjective:    Patient ID: [name], female    DOB: 12/06/1945, 73 y.o.   MRN: [id number]  HPI   [name] is here regarding her chronic pain and vestibular schwannoma. For the most part things have been stable. Her methadone and nucynta allow her quality of life. 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   She has developed pain in the left side of her neck over the last few weeks. She is worried that it might be related to her prior surgery. She has done some gentle stretching but not much else to this point.   Otherwise [name] reports no new health issues. Mood is good.   Pain Inventory Average Pain 6 Pain Right Now 6 My pain is constant, burning, stabbing and tingling  In the last 24 hours, has pain interfered with the following? General activity 9 Relation with others 9 Enjoyment of life 9 What TIME of day is your pain at its worst? daytime Sleep (in general) Fair  Pain is worse with: . Pain improves with: heat/ice and medication Relief from Meds: 7  Mobility walk without assistance ability to climb steps?  yes do you drive?  yes  Function employed # of hrs/week 61 retired I need assistance with the following:  household duties and shopping  Neuro/Psych dizziness anxiety loss of taste or smell  Prior Studies Any changes since last visit?  no  Physicians involved in your care Any changes since last visit?  no   Family History  Problem Relation Age of Onset  . Hypertension Mother   . Transient ischemic attack Mother   . Coronary artery disease Father        CHF  . Coronary artery disease Sister   . Macular degeneration Sister   . Transient ischemic attack Maternal Grandmother   . Diabetes Neg Hx   . Cancer Neg Hx    Social History   Socioeconomic History  . Marital status: Married    Spouse name: Not on file  . Number of children: 1  . Years of education: Not on file  . Highest education level: Not on file  Occupational  History  . Occupation: Retired  Scientific laboratory technician  . Financial resource strain: Not on file  . Food insecurity    Worry: Not on file    Inability: Not on file  . Transportation needs    Medical: Not on file    Non-medical: Not on file  Tobacco Use  . Smoking status: Never Smoker  . Smokeless tobacco: Never Used  Substance and Sexual Activity  . Alcohol use: No    Alcohol/week: 0.0 standard drinks  . Drug use: No  . Sexual activity: Not on file  Lifestyle  . Physical activity    Days per week: Not on file    Minutes per session: Not on file  . Stress: Not on file  Relationships  . Social Herbalist on phone: Not on file    Gets together: Not on file    Attends religious service: Not on file    Active member of club or organization: Not on file    Attends meetings of clubs or organizations: Not on file    Relationship status: Not on file  Other Topics Concern  . Not on file  Social History Narrative  . Not on file   Past Surgical History:  Procedure Laterality Date  . ABDOMINAL HYSTERECTOMY    . CARDIAC SURGERY  1955   closed hole in heart  . CATARACT EXTRACTION Left 02/02/2016  . LUMBAR DISC SURGERY     Dr Eddie Dibbles  . no colonoscopy     "scared"; SOC reviewed  . 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 Abnormality of gait   . Brachial neuritis or radiculitis NOS   . Cervicalgia   . Facial nerve disorder   . Hearing loss    Left ear  . 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 Hyperlipidemia   . Hypertension   . Macular degeneration   . Myofascial pain   . Pneumonia 2005  . Thyroid disease    hyprothyroidism  . Trigeminal neuralgia    There were no vitals taken for this visit.  Opioid Risk Score:   Fall Risk Score:  `1  Depression screen PHQ 2/9  Depression screen [facility] 2/9 04/24/2018 10/24/2017 06/20/2017 02/23/2017 12/29/2016 02/04/2016 05/22/2014  Decreased  Interest 0 1 0 0 0 0 1  Down, Depressed, Hopeless 0 1 0 0 0 0 0  PHQ - 2 Score 0 2 0 0 0 0 1  Altered sleeping - - - - - - 1  Tired, decreased energy - - - - - - 2  Change in appetite - - - - - - 0  Feeling bad or failure about yourself  - - - - - - 0  Trouble concentrating - - - - - - 1  Moving slowly or fidgety/restless - - - - - - 0  Suicidal thoughts - - - - - - 0  PHQ-9 Score - - - - - - 5     Review of Systems  Constitutional: Positive for unexpected weight change.  HENT: Negative.   Eyes: Negative.   Respiratory: Negative.   Cardiovascular: Positive for leg swelling.  Gastrointestinal: Positive for constipation.  Endocrine: Negative.   Genitourinary: Positive for difficulty urinating.  Musculoskeletal: Negative.   Skin: Negative.   Allergic/Immunologic: Negative.   Neurological: Positive for dizziness.  Hematological: Negative.   Psychiatric/Behavioral: The patient is nervous/anxious.   All other systems reviewed and are negative.      Objective:   Physical Exam Gen: no distress, normal appearing HEENT: oral mucosa pink and moist, NCAT Cardio: Reg rate Chest: normal effort, normal rate of breathing Abd: soft, non-distended Ext: no edema Skin: intact Neuro: Patient with ongoing left facial droop and inability to completely close left eyelid.  Speech is generally clear.  Mild balance deficits but she is able to easily compensate. Musculoskeletal: Patient with tightness of the left neck and in particular the left sternocleidomastoid muscle which was tender to palpation. Psych: pleasant, normal affect        Assessment & Plan:  ASSESSMENT:  1. History of vestibular schwannoma resulting in trigeminal neuralgia and left facial nerve injury- no tumor regrowth per MRI--has chronic peri-operative scarring and atrophy which accounts for weakness, pain, and balance issues.  -stable at present. 2. History of cervicalgia and lumbar spine surgery---recent low  back strain? 3. Chronic pain related to CN 5 involvement  4. Hypothyroid on supplementation  5. Right shoulder pain resolved.  6. Reactive depression and anxiety     PLAN:  1. Methadone 10 mg daily, #30refilled.RF today as below Nucynta 50 mg 1 p.o. q.12 h. p.r.n. 30. No refill today -We will continue the controlled substance  monitoring program, this consists of regular clinic visits, examinations, routine drug screening, pill counts as well as use of [place] Controlled Substance Reporting System. [place]CC[place] was reviewed today.   -Medication was refilled and a second prescription was sent to the patient's pharmacy for next month.     2.Left SCM strain: recommend heat. ROM exercises were provided and reviewed. . Needs to think about posture while at work and home as well.  3.Hypothyroid: Continue supplementation. Really needs a full thyroid battery at some point soon.. 4.continue with voltaren gel, topicals. 5.Eye mgt per optho/ENT--stable.follow up this year 6.Follow up with me in2 months. 19minutes of face to face patient care time were spent during this visit. All questions were encouraged and answered.

## 2018-12-20 NOTE — Progress Notes (Signed)
toc

## 2018-12-20 NOTE — Patient Instructions (Signed)
PLEASE FEEL FREE TO CALL OUR OFFICE WITH ANY PROBLEMS OR QUESTIONS ([phone])      

## 2018-12-23 LAB — TOXASSURE SELECT,+ANTIDEPR,UR

## 2018-12-25 ENCOUNTER — Telehealth: Payer: Self-pay | Admitting: *Deleted

## 2018-12-25 NOTE — Telephone Encounter (Signed)
Urine drug screen for this encounter is consistent for prescribed medication. Chalea takes Tranxene at hs and oxazepam is a metabolite.

## 2019-02-20 ENCOUNTER — Telehealth: Payer: Self-pay | Admitting: Family

## 2019-02-20 NOTE — Telephone Encounter (Signed)
Please contact pt to schedule a follow up visit with me.

## 2019-02-21 ENCOUNTER — Encounter: Payer: Medicare Other | Attending: Physical Medicine & Rehabilitation | Admitting: Physical Medicine & Rehabilitation

## 2019-02-21 ENCOUNTER — Encounter: Payer: Self-pay | Admitting: Physical Medicine & Rehabilitation

## 2019-02-21 ENCOUNTER — Other Ambulatory Visit: Payer: Self-pay

## 2019-02-21 VITALS — HR 82 | Temp 97.3°F | Ht 63.0 in | Wt 167.5 lb

## 2019-02-21 DIAGNOSIS — G500 Trigeminal neuralgia: Secondary | ICD-10-CM | POA: Insufficient documentation

## 2019-02-21 DIAGNOSIS — D333 Benign neoplasm of cranial nerves: Secondary | ICD-10-CM | POA: Insufficient documentation

## 2019-02-21 DIAGNOSIS — R269 Unspecified abnormalities of gait and mobility: Secondary | ICD-10-CM

## 2019-02-21 DIAGNOSIS — G894 Chronic pain syndrome: Secondary | ICD-10-CM | POA: Insufficient documentation

## 2019-02-21 DIAGNOSIS — Z5181 Encounter for therapeutic drug level monitoring: Secondary | ICD-10-CM | POA: Insufficient documentation

## 2019-02-21 DIAGNOSIS — G8929 Other chronic pain: Secondary | ICD-10-CM | POA: Insufficient documentation

## 2019-02-21 DIAGNOSIS — Z79899 Other long term (current) drug therapy: Secondary | ICD-10-CM | POA: Insufficient documentation

## 2019-02-21 MED ORDER — TAPENTADOL HCL 50 MG PO TABS: 50.0000 mg | ORAL_TABLET | Freq: Two times a day (BID) | ORAL | 0 refills | Status: DC | PRN

## 2019-02-21 MED ORDER — METHADONE HCL 10 MG PO TABS: 10.0000 mg | ORAL_TABLET | Freq: Every day | ORAL | 0 refills | Status: DC

## 2019-02-21 NOTE — Progress Notes (Signed)
Subjective:    Patient ID: [name], female    DOB: August 16, 1945, 74 y.o.   MRN: [id number]  HPI   Due to national recommendations of social distancing because of COVID 29, an audio/video tele-health visit is felt to be the most appropriate encounter for this patient at this time. See MyChart message from today for the patient's consent to a tele-health encounter with [facility]. This is a follow up telephone visit for the patient who is at home. MD is at office.    [name] is for a televisit to f/u her chronic pain. She tells me that her pain levels have been pretty consistent. The pain in her face feels worse but she's able to manage it with her meds which include nucynta and methadone. She is using a half nucynta twice a day sometimes.  She came down with COVID this past fall but only had mild symptoms over Thanksgiving. She is back to baseline again after she suffered with a lot of weakness and fatigue for two weeks.    Pain Inventory Average Pain 7 Pain Right Now 5 My pain is constant, sharp, burning and tingling  In the last 24 hours, has pain interfered with the following? General activity 10 Relation with others 8 Enjoyment of life 10 What TIME of day is your pain at its worst? evening Sleep (in general) Good  Pain is worse with: some activites and photophobia Pain improves with: rest, heat/ice and medication Relief from Meds: 10  Mobility walk without assistance ability to climb steps?  yes do you drive?  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  no  Physicians involved in your care Any changes since last visit?  no   Family History  Problem Relation Age of Onset  . Hypertension Mother   . Transient ischemic attack Mother   . Coronary artery disease Father        CHF    . Coronary artery disease Sister   . Macular degeneration Sister   . Transient ischemic attack Maternal Grandmother   . Diabetes Neg Hx   . Cancer Neg Hx    Social History   Socioeconomic History  . Marital status: Married    Spouse name: Not on file  . Number of children: 1  . Years of education: Not on file  . Highest education level: Not on file  Occupational History  . Occupation: Retired  Tobacco Use  . Smoking status: Never Smoker  . Smokeless tobacco: Never Used  Substance and Sexual Activity  . Alcohol use: No    Alcohol/week: 0.0 standard drinks  . Drug use: No  . Sexual activity: Not on file  Other Topics Concern  . Not on file  Social History Narrative  . Not on file   Social Determinants of Health   Financial Resource Strain:   . Difficulty of Paying Living Expenses: Not on file  Food Insecurity:   . Worried About Charity fundraiser in the Last Year: Not on file  . Ran Out of Food in the Last Year: Not on file  Transportation Needs:   . Lack of Transportation (Medical): Not on file  . Lack of Transportation (Non-Medical): Not on file  Physical Activity:   . Days of Exercise per Week: Not on file  . Minutes of  Exercise per Session: Not on file  Stress:   . Feeling of Stress : Not on file  Social Connections:   . Frequency of Communication with Friends and Family: Not on file  . Frequency of Social Gatherings with Friends and Family: Not on file  . Attends Religious Services: Not on file  . Active Member of Clubs or Organizations: Not on file  . Attends Archivist Meetings: Not on file  . Marital Status: Not on file   Past Surgical History:  Procedure Laterality Date  . ABDOMINAL HYSTERECTOMY    . [place]   closed hole in heart  . CATARACT EXTRACTION Left 02/02/2016  . LUMBAR DISC SURGERY     Dr Eddie Dibbles  . no colonoscopy     "scared"; SOC reviewed  . 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; 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  ear   Past Medical History:  Diagnosis Date  . Abnormality of gait   . Brachial neuritis or radiculitis NOS   . Cervicalgia   . Facial nerve disorder   . Hearing loss    Left ear  . 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 Hyperlipidemia   . Hypertension   . Macular degeneration   . Myofascial pain   . Pneumonia 2005  . Thyroid disease    hyprothyroidism  . Trigeminal neuralgia    There were no vitals taken for this visit.  Opioid Risk Score:   Fall Risk Score:  `1  Depression screen PHQ 2/9  Depression screen [facility] 2/9 04/24/2018 10/24/2017 06/20/2017 02/23/2017 12/29/2016 02/04/2016 05/22/2014  Decreased Interest 0 1 0 0 0 0 1  Down, Depressed, Hopeless 0 1 0 0 0 0 0  PHQ - 2 Score 0 2 0 0 0 0 1  Altered sleeping - - - - - - 1  Tired, decreased energy - - - - - - 2  Change in appetite - - - - - - 0  Feeling bad or failure about yourself  - - - - - - 0  Trouble concentrating - - - - - - 1  Moving slowly or fidgety/restless - - - - - - 0  Suicidal thoughts - - - - - - 0  PHQ-9 Score - - - - - - 5    Review of Systems  Constitutional: Negative.   HENT: Positive for facial swelling.   Eyes: Positive for photophobia.  Respiratory: Negative.   Cardiovascular: Negative.   Gastrointestinal: Negative.   Endocrine: Negative.   Genitourinary: Negative.   Musculoskeletal: Positive for neck pain and neck stiffness.  Skin: Negative.   Allergic/Immunologic: Negative.   Neurological: Positive for headaches.       Left side facial pain  Psychiatric/Behavioral: The patient is nervous/anxious.   All other systems reviewed and are negative.           Assessment & Plan:  ASSESSMENT:  1. History of vestibular schwannoma resulting in trigeminal neuralgia and left facial nerve injury- no tumor regrowth per MRI--has chronic peri-operative scarring and atrophy which accounts for weakness, pain, and balance issues.  -stable at present. 2. History of  cervicalgia and lumbar spine surgery---recent low back strain? 3. Chronic pain related to CN 5 involvement  4. Hypothyroid on supplementation  5. Right shoulder pain resolved.  6. Reactive depression and anxiety     PLAN:  1. Methadone 10 mg daily, #30refilled.RF today as below  Nucynta 50 mg 1 p.o. q.12 h. p.r.n. 30. RF today -We will continue the controlled substance monitoring program, this consists of regular clinic visits, examinations, routine drug screening, pill counts as well as use of [place] Controlled Substance Reporting System. [place] was reviewed today.   -Medication was refilled and a second prescription was sent to the patient's pharmacy for next month.   -we discussed other options including nerve blocks SPG/TG but I would like to see how she fares once we get through the covid crisis, winter, etc. I think those issues amongst others are having an impact on her pain 2.Continue HEP. Work on [employer], emotional health too 3.Hypothyroid: Continue supplementation. Really needs a full thyroid battery at some point soon.. 4.continue with voltaren gel, topicals. 5.Eye mgt per optho/ENT--stable.follow up per ENT 6.Follow up with me in2 months. 11 minutes of tele-visit time was spent with this patient today.

## 2019-02-23 NOTE — Telephone Encounter (Signed)
LM with pt husband to have pt call to schedule appt with [facility]

## 2019-02-27 ENCOUNTER — Telehealth: Payer: Self-pay | Admitting: Family

## 2019-02-27 NOTE — Telephone Encounter (Signed)
Spoke with [name] regarding AWV. Patient stated she will give office a call back in March to schedule appointment. SF

## 2019-04-18 ENCOUNTER — Other Ambulatory Visit: Payer: Self-pay

## 2019-04-18 ENCOUNTER — Encounter: Payer: Medicare Other | Attending: Physical Medicine & Rehabilitation | Admitting: Physical Medicine & Rehabilitation

## 2019-04-18 ENCOUNTER — Encounter: Payer: Self-pay | Admitting: Physical Medicine & Rehabilitation

## 2019-04-18 VITALS — BP 135/73 | HR 82 | Temp 97.7°F | Ht 63.0 in | Wt 169.0 lb

## 2019-04-18 DIAGNOSIS — G500 Trigeminal neuralgia: Secondary | ICD-10-CM

## 2019-04-18 DIAGNOSIS — G894 Chronic pain syndrome: Secondary | ICD-10-CM | POA: Diagnosis present

## 2019-04-18 DIAGNOSIS — Z79899 Other long term (current) drug therapy: Secondary | ICD-10-CM | POA: Diagnosis present

## 2019-04-18 DIAGNOSIS — S0452XS Injury of facial nerve, left side, sequela: Secondary | ICD-10-CM

## 2019-04-18 DIAGNOSIS — Z5181 Encounter for therapeutic drug level monitoring: Secondary | ICD-10-CM | POA: Diagnosis present

## 2019-04-18 DIAGNOSIS — G8929 Other chronic pain: Secondary | ICD-10-CM | POA: Insufficient documentation

## 2019-04-18 DIAGNOSIS — D333 Benign neoplasm of cranial nerves: Secondary | ICD-10-CM | POA: Diagnosis not present

## 2019-04-18 MED ORDER — METHADONE HCL 10 MG PO TABS: 10.0000 mg | ORAL_TABLET | Freq: Every day | ORAL | 0 refills | Status: DC

## 2019-04-18 MED ORDER — TAPENTADOL HCL 50 MG PO TABS: 50.0000 mg | ORAL_TABLET | Freq: Two times a day (BID) | ORAL | 0 refills | Status: DC | PRN

## 2019-04-18 NOTE — Progress Notes (Signed)
Subjective:    Patient ID: [name], female    DOB: Jul 11, 1945, 74 y.o.   MRN: [id number]  HPI   Pain Inventory Average Pain 7 Pain Right Now 7 My pain is intermittent, sharp and burning  In the last 24 hours, has pain interfered with the following? General activity 8 Relation with others 9 Enjoyment of life 10 What TIME of day is your pain at its worst? morning, evening  Sleep (in general) Good  Pain is worse with: talking, jaw movement Pain improves with: rest, heat/ice and medication Relief from Meds: 5  Mobility walk without assistance ability to climb steps?  yes do you drive?  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  no  Physicians involved in your care Any changes since last visit?  no   Family History  Problem Relation Age of Onset  . Hypertension Mother   . Transient ischemic attack Mother   . Coronary artery disease Father        CHF  . Coronary artery disease Sister   . Macular degeneration Sister   . Transient ischemic attack Maternal Grandmother   . Diabetes Neg Hx   . Cancer Neg Hx    Social History   Socioeconomic History  . Marital status: Married    Spouse name: Not on file  . Number of children: 1  . Years of education: Not on file  . Highest education level: Not on file  Occupational History  . Occupation: Retired  Tobacco Use  . Smoking status: Never Smoker  . Smokeless tobacco: Never Used  Substance and Sexual Activity  . Alcohol use: No    Alcohol/week: 0.0 standard drinks  . Drug use: No  . Sexual activity: Not on file  Other Topics Concern  . Not on file  Social History Narrative  . Not on file   Social Determinants of Health   Financial Resource Strain:   . Difficulty of Paying Living Expenses: Not on file  Food Insecurity:   . Worried About Charity fundraiser in the Last Year: Not on file   . Ran Out of Food in the Last Year: Not on file  Transportation Needs:   . Lack of Transportation (Medical): Not on file  . Lack of Transportation (Non-Medical): Not on file  Physical Activity:   . Days of Exercise per Week: Not on file  . Minutes of Exercise per Session: Not on file  Stress:   . Feeling of Stress : Not on file  Social Connections:   . Frequency of Communication with Friends and Family: Not on file  . Frequency of Social Gatherings with Friends and Family: Not on file  . Attends Religious Services: Not on file  . Active Member of Clubs or Organizations: Not on file  . Attends Archivist Meetings: Not on file  . Marital Status: Not on file   Past Surgical History:  Procedure Laterality Date  . ABDOMINAL HYSTERECTOMY    . [place]   closed hole in heart  . CATARACT EXTRACTION Left 02/02/2016  . LUMBAR DISC SURGERY     Dr Eddie Dibbles  . no colonoscopy     "scared"; SOC reviewed  . 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 Abnormality  of gait   . Brachial neuritis or radiculitis NOS   . Cervicalgia   . Facial nerve disorder   . Hearing loss    Left ear  . 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 Hyperlipidemia   . Hypertension   . Macular degeneration   . Myofascial pain   . Pneumonia 2005  . Thyroid disease    hyprothyroidism  . Trigeminal neuralgia    BP 135/73   Pulse 82   Temp 97.7 F (36.5 C)   Ht 5\' 3"  (1.6 m)   Wt 169 lb (76.7 kg)   SpO2 97%   BMI 29.94 kg/m   Opioid Risk Score:   Fall Risk Score:  `1  Depression screen PHQ 2/9  Depression screen [facility] 2/9 04/24/2018 10/24/2017 06/20/2017 02/23/2017 12/29/2016 02/04/2016 05/22/2014  Decreased Interest 0 1 0 0 0 0 1  Down, Depressed, Hopeless 0 1 0 0 0 0 0  PHQ - 2 Score 0 2 0 0 0 0 1  Altered sleeping - - - - - - 1  Tired, decreased energy - - - - - - 2  Change in appetite - - - - - -  0  Feeling bad or failure about yourself  - - - - - - 0  Trouble concentrating - - - - - - 1  Moving slowly or fidgety/restless - - - - - - 0  Suicidal thoughts - - - - - - 0  PHQ-9 Score - - - - - - 5    Review of Systems  Constitutional: Positive for unexpected weight change.  HENT: Negative.   Eyes: Negative.   Respiratory: Negative.   Cardiovascular: Positive for leg swelling.  Gastrointestinal: Positive for constipation.  Endocrine: Negative.   Genitourinary: Negative.   Musculoskeletal: Negative.   Skin: Negative.   Allergic/Immunologic: Negative.   Neurological: Positive for weakness.       Tingling   Psychiatric/Behavioral: The patient is nervous/anxious.   All other systems reviewed and are negative.      Objective:   Physical Exam Gen: no distress, normal appearing HEENT: oral mucosa pink and moist, NCAT Cardio: Reg rate Chest: normal effort, normal rate of breathing Abd: soft, non-distended Ext: no edema Skin: intact, warm Neuro: left central 7 weakness, sensory loss to left face, tongue Musculoskeletal: normal ROM.  Psych: pleasant, normal affect        Assessment & Plan:  ASSESSMENT:  1. History of vestibular schwannoma resulting in trigeminal neuralgia and left facial nerve injury- no tumor regrowth per MRI--has chronic peri-operative scarring and atrophy which accounts for weakness, pain, and balance issues.  -stable at present. 2. History of cervicalgia and lumbar spine surgery---recent low back strain? 3. Chronic pain related to CN 5 involvement  4. Hypothyroid on supplementation  5. Right shoulder pain resolved.  6. Reactive depression and anxiety     PLAN:  1. Methadone 10 mg daily, #30refilled.RF todayas below Nucynta 50 mg 1 p.o. q.12 h. p.r.n. 30.RF today We will continue the controlled substance monitoring program, this consists of regular clinic visits, examinations, routine drug screening, pill counts as  well as use of [place] Controlled Substance Reporting System. [place] was reviewed today.   Medication was refilled and a second prescription was sent to the patient's pharmacy for next month.    -we discussed other options including nerve blocks SPG/TG but I would like to see how she fares once we get through  the covid crisis, winter, etc. I think those issues amongst others are having an impact on her pain 2.Continue HEP. She stays active with work. Needs to make some time for walking, therapeutic activities as well.  3.Hypothyroid: Continue supplementation. Really needs a full thyroid battery at some point soon.. 4.continue with voltaren gel, topicals. 5.Eye mgt per ENT 6.Follow up with me in17months. 10 minutes of time was spent with this patient today.

## 2019-04-18 NOTE — Patient Instructions (Signed)
PLEASE FEEL FREE TO CALL OUR OFFICE WITH ANY PROBLEMS OR QUESTIONS ([phone])      

## 2019-06-20 ENCOUNTER — Other Ambulatory Visit: Payer: Self-pay

## 2019-06-20 ENCOUNTER — Encounter: Payer: Self-pay | Admitting: Physical Medicine & Rehabilitation

## 2019-06-20 ENCOUNTER — Encounter: Payer: Medicare Other | Attending: Physical Medicine & Rehabilitation | Admitting: Physical Medicine & Rehabilitation

## 2019-06-20 VITALS — BP 163/73 | HR 77 | Temp 97.5°F | Ht 63.0 in | Wt 169.0 lb

## 2019-06-20 DIAGNOSIS — Z5181 Encounter for therapeutic drug level monitoring: Secondary | ICD-10-CM | POA: Insufficient documentation

## 2019-06-20 DIAGNOSIS — G500 Trigeminal neuralgia: Secondary | ICD-10-CM | POA: Diagnosis not present

## 2019-06-20 DIAGNOSIS — Z79891 Long term (current) use of opiate analgesic: Secondary | ICD-10-CM | POA: Insufficient documentation

## 2019-06-20 DIAGNOSIS — G894 Chronic pain syndrome: Secondary | ICD-10-CM | POA: Insufficient documentation

## 2019-06-20 DIAGNOSIS — Z79899 Other long term (current) drug therapy: Secondary | ICD-10-CM | POA: Diagnosis present

## 2019-06-20 DIAGNOSIS — G8929 Other chronic pain: Secondary | ICD-10-CM | POA: Diagnosis present

## 2019-06-20 DIAGNOSIS — S0452XS Injury of facial nerve, left side, sequela: Secondary | ICD-10-CM

## 2019-06-20 DIAGNOSIS — D333 Benign neoplasm of cranial nerves: Secondary | ICD-10-CM | POA: Diagnosis not present

## 2019-06-20 MED ORDER — METHADONE HCL 10 MG PO TABS: 10.0000 mg | ORAL_TABLET | Freq: Every day | ORAL | 0 refills | Status: DC

## 2019-06-20 NOTE — Progress Notes (Signed)
Subjective:    Patient ID: [name], female    DOB: [birth year], 74 y.o.   MRN: [id number]  HPI   [name] is here in follow-up of her chronic pain.  Last saw her in March. Her pain levels have been stable. She continues to work at [employer]. She's been busier these last few weeks with increased travel and events happening.   Her balance and mood have been good. Bowels are moving regularly. Left eye remains an issue at times d/t her MD and chronic lid weakness.   Pain Inventory Average Pain 7 Pain Right Now 5 My pain is sharp, burning and tingling  In the last 24 hours, has pain interfered with the following? General activity 6 Relation with others 9 Enjoyment of life 8 What TIME of day is your pain at its worst? daytime Sleep (in general) Good  Pain is worse with: inactivity and talking Pain improves with: rest, heat/ice and medication Relief from Meds: 8  Mobility walk without assistance ability to climb steps?  yes do you drive?  yes transfers alone Do you have any goals in this area?  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  no  Neuro/Psych weakness numbness dizziness anxiety loss of taste or smell  Prior Studies Any changes since last visit?  no  Physicians involved in your care Any changes since last visit?  no   Family History  Problem Relation Age of Onset  . Hypertension Mother   . Transient ischemic attack Mother   . Coronary artery disease Father        CHF  . Coronary artery disease Sister   . Macular degeneration Sister   . Transient ischemic attack Maternal Grandmother   . Diabetes Neg Hx   . Cancer Neg Hx    Social History   Socioeconomic History  . Marital status: Married    Spouse name: Not on file  . Number of children: 1  . Years of education: Not on file  . Highest education  level: Not on file  Occupational History  . Occupation: Retired  Tobacco Use  . Smoking status: Never Smoker  . Smokeless tobacco: Never Used  Substance and Sexual Activity  . Alcohol use: No    Alcohol/week: 0.0 standard drinks  . Drug use: No  . Sexual activity: Not on file  Other Topics Concern  . Not on file  Social History Narrative  . Not on file   Social Determinants of Health   Financial Resource Strain:   . Difficulty of Paying Living Expenses:   Food Insecurity:   . Worried About Charity fundraiser in the Last Year:   . Arboriculturist in the Last Year:   Transportation Needs:   . Film/video editor (Medical):   Marland Kitchen Lack of Transportation (Non-Medical):   Physical Activity:   . Days of Exercise per Week:   . Minutes of Exercise per Session:   Stress:   . Feeling of Stress :   Social Connections:   . Frequency of Communication with Friends and Family:   . Frequency of Social Gatherings with Friends and Family:   . Attends Religious Services:   . Active Member of Clubs or Organizations:   . Attends Archivist Meetings:   Marland Kitchen Marital Status:    Past  Surgical History:  Procedure Laterality Date  . ABDOMINAL HYSTERECTOMY    . [place]   closed hole in heart  . CATARACT EXTRACTION Left 02/02/2016  . LUMBAR DISC SURGERY     Dr Eddie Dibbles  . no colonoscopy     "scared"; SOC reviewed  . 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 Abnormality of gait   . Brachial neuritis or radiculitis NOS   . Cervicalgia   . Facial nerve disorder   . Hearing loss    Left ear  . 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 Hyperlipidemia   . Hypertension   . Macular degeneration   . Myofascial pain   . Pneumonia 2005  . Thyroid disease    hyprothyroidism  . Trigeminal neuralgia    BP (!) 163/73   Pulse 77   Temp (!) 97.5 F (36.4 C)   Ht 5\' 3"  (1.6 m)   Wt  169 lb (76.7 kg)   SpO2 97%   BMI 29.94 kg/m   Opioid Risk Score:   Fall Risk Score:  `1  Depression screen PHQ 2/9  Depression screen [facility] 2/9 04/24/2018 10/24/2017 06/20/2017 02/23/2017 12/29/2016 02/04/2016 05/22/2014  Decreased Interest 0 1 0 0 0 0 1  Down, Depressed, Hopeless 0 1 0 0 0 0 0  PHQ - 2 Score 0 2 0 0 0 0 1  Altered sleeping - - - - - - 1  Tired, decreased energy - - - - - - 2  Change in appetite - - - - - - 0  Feeling bad or failure about yourself  - - - - - - 0  Trouble concentrating - - - - - - 1  Moving slowly or fidgety/restless - - - - - - 0  Suicidal thoughts - - - - - - 0  PHQ-9 Score - - - - - - 5    Review of Systems  Constitutional: Positive for unexpected weight change.  HENT: Negative.   Eyes: Negative.   Respiratory: Negative.   Cardiovascular: Positive for leg swelling.  Gastrointestinal: Positive for constipation.  Endocrine: Negative.   Genitourinary: Positive for difficulty urinating.  Musculoskeletal: Positive for neck pain and neck stiffness.  Skin: Negative.   Allergic/Immunologic: Negative.   Neurological: Positive for dizziness, weakness and numbness.  Hematological: Negative.   Psychiatric/Behavioral: Negative.   All other systems reviewed and are negative.      Objective:   Physical Exam    Physical Exam Gen: no distress, normal appearing HEENT: oral mucosa pink and moist, NCAT Cardio: Reg rate Chest: normal effort, normal rate of breathing Abd: soft, non-distended Ext: no edema Psych: pleasant, normal affect  Skin: intact, warm Neuro: left central 7 weakness, sensory loss to left face, tongue--no changes Musculoskeletal: normal ROM.  Psych: pleasant, normal affect            Assessment & Plan:  ASSESSMENT:  1. History of vestibular schwannoma resulting in trigeminal neuralgia and left facial nerve injury- no tumor regrowth per MRI--has chronic peri-operative scarring and atrophy which accounts for weakness, pain, and  balance issues.              -stable at present. 2. History of cervicalgia and lumbar spine surgery---recent low back strain? 3. Chronic pain related to CN 5 involvement  4. Hypothyroid on supplementation  5. Right shoulder pain resolved.   6. Reactive depression and anxiety         PLAN:  1. Methadone 10 mg daily, #30 refilled. RF today as below     Nucynta 50 mg 1 p.o. q.12 h. p.r.n. 30.  RF today -We will continue the controlled substance monitoring program, this consists of regular clinic visits, examinations, routine drug screening, pill counts as well as use of [place] Controlled Substance Reporting System. [place] was reviewed today.   -Medication was refilled and a second prescription was sent to the patient's pharmacy for next month.     -we discussed other options including nerve blocks SPG/TG but I would like to see how she fares once we get through the covid crisis, winter, etc. I think those issues amongst others are having an impact on her pain    -UDS today 2. maintain HEP. 3.  Hypothyroid 4. continue with voltaren gel, topicals.   5. Eye mgt per ENT 6. Fifteen minutes of face to face patient care time were spent during this visit. All questions were encouraged and answered.  Follow up with me in 2 mos .

## 2019-06-20 NOTE — Patient Instructions (Signed)
PLEASE FEEL FREE TO CALL OUR OFFICE WITH ANY PROBLEMS OR QUESTIONS ([phone])      

## 2019-06-23 LAB — TOXASSURE SELECT,+ANTIDEPR,UR

## 2019-06-27 ENCOUNTER — Other Ambulatory Visit: Payer: Self-pay | Admitting: Family Medicine

## 2019-06-27 DIAGNOSIS — Z1231 Encounter for screening mammogram for malignant neoplasm of breast: Secondary | ICD-10-CM

## 2019-06-29 ENCOUNTER — Telehealth: Payer: Self-pay | Admitting: *Deleted

## 2019-06-29 NOTE — Telephone Encounter (Signed)
Patient signed a CSA with [facility].

## 2019-08-22 ENCOUNTER — Encounter: Payer: Medicare Other | Attending: Physical Medicine & Rehabilitation | Admitting: Physical Medicine & Rehabilitation

## 2019-08-22 ENCOUNTER — Encounter: Payer: Self-pay | Admitting: Physical Medicine & Rehabilitation

## 2019-08-22 ENCOUNTER — Other Ambulatory Visit: Payer: Self-pay

## 2019-08-22 DIAGNOSIS — G500 Trigeminal neuralgia: Secondary | ICD-10-CM | POA: Diagnosis present

## 2019-08-22 DIAGNOSIS — Z79891 Long term (current) use of opiate analgesic: Secondary | ICD-10-CM | POA: Insufficient documentation

## 2019-08-22 DIAGNOSIS — Z79899 Other long term (current) drug therapy: Secondary | ICD-10-CM | POA: Diagnosis present

## 2019-08-22 DIAGNOSIS — G894 Chronic pain syndrome: Secondary | ICD-10-CM | POA: Diagnosis not present

## 2019-08-22 DIAGNOSIS — Z5181 Encounter for therapeutic drug level monitoring: Secondary | ICD-10-CM | POA: Insufficient documentation

## 2019-08-22 DIAGNOSIS — D333 Benign neoplasm of cranial nerves: Secondary | ICD-10-CM | POA: Insufficient documentation

## 2019-08-22 DIAGNOSIS — G8929 Other chronic pain: Secondary | ICD-10-CM | POA: Diagnosis present

## 2019-08-22 MED ORDER — METHADONE HCL 10 MG PO TABS: 10.0000 mg | ORAL_TABLET | Freq: Every day | ORAL | 0 refills | Status: DC

## 2019-08-22 MED ORDER — TAPENTADOL HCL 50 MG PO TABS: 50.0000 mg | ORAL_TABLET | Freq: Two times a day (BID) | ORAL | 0 refills | Status: DC | PRN

## 2019-08-22 NOTE — Patient Instructions (Signed)
PLEASE FEEL FREE TO CALL OUR OFFICE WITH ANY PROBLEMS OR QUESTIONS ([phone])      

## 2019-08-22 NOTE — Progress Notes (Signed)
Subjective:    Patient ID: [name], female    DOB: Jun 11, 1945, 74 y.o.   MRN: [id number]  HPI  [name] is here in follow up of her chronic pain. She reports some bleeding in her left ear which was related to an ear infection. Her left neck.shoulder is still giving her problems. She is doing exercises at home which haven't helped a whole lot. Pain seems to run from her upper neck to the top of her scapula/shoulder.   She maintains on methadone for pain control. She has used more nucynta recently as well d/t her pain.   Pain Inventory Average Pain 8 Pain Right Now 8 My pain is sharp, burning and tingling  In the last 24 hours, has pain interfered with the following? General activity 9 Relation with others 10 Enjoyment of life 10 What TIME of day is your pain at its worst? daytime Sleep (in general) Good  Pain is worse with: unsure Pain improves with: medication Relief from Meds: 8  Mobility walk without assistance  Function employed # of hrs/week 25  Neuro/Psych dizziness anxiety loss of taste or smell  Prior Studies Any changes since last visit?  no  Physicians involved in your care Any changes since last visit?  no   Family History  Problem Relation Age of Onset  . Hypertension Mother   . Transient ischemic attack Mother   . Coronary artery disease Father        CHF  . Coronary artery disease Sister   . Macular degeneration Sister   . Transient ischemic attack Maternal Grandmother   . Diabetes Neg Hx   . Cancer Neg Hx    Social History   Socioeconomic History  . Marital status: Married    Spouse name: Not on file  . Number of children: 1  . Years of education: Not on file  . Highest education level: Not on file  Occupational History  . Occupation: Retired  Tobacco Use  . Smoking status: Never Smoker  . Smokeless tobacco: Never Used  Vaping Use  . Vaping Use: Never used  Substance and Sexual Activity  . Alcohol use: No     Alcohol/week: 0.0 standard drinks  . Drug use: No  . Sexual activity: Not on file  Other Topics Concern  . Not on file  Social History Narrative  . Not on file   Social Determinants of Health   Financial Resource Strain:   . Difficulty of Paying Living Expenses:   Food Insecurity:   . Worried About Charity fundraiser in the Last Year:   . Arboriculturist in the Last Year:   Transportation Needs:   . Film/video editor (Medical):   Marland Kitchen Lack of Transportation (Non-Medical):   Physical Activity:   . Days of Exercise per Week:   . Minutes of Exercise per Session:   Stress:   . Feeling of Stress :   Social Connections:   . Frequency of Communication with Friends and Family:   . Frequency of Social Gatherings with Friends and Family:   . Attends Religious Services:   . Active Member of Clubs or Organizations:   . Attends Archivist Meetings:   Marland Kitchen Marital Status:    Past Surgical History:  Procedure Laterality Date  . ABDOMINAL HYSTERECTOMY    . [place]   closed hole in heart  . CATARACT EXTRACTION Left 02/02/2016  . LUMBAR DISC SURGERY  Dr Eddie Dibbles  . no colonoscopy     "scared"; SOC reviewed  . 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 Abnormality of gait   . Brachial neuritis or radiculitis NOS   . Cervicalgia   . Facial nerve disorder   . Hearing loss    Left ear  . 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 Hyperlipidemia   . Hypertension   . Macular degeneration   . Myofascial pain   . Pneumonia 2005  . Thyroid disease    hyprothyroidism  . Trigeminal neuralgia    Ht 5\' 3"  (1.6 m)   Wt 167 lb (75.8 kg)   BMI 29.58 kg/m   Opioid Risk Score:   Fall Risk Score:  `1  Depression screen PHQ 2/9  Depression screen [facility] 2/9 04/24/2018 10/24/2017 06/20/2017 02/23/2017 12/29/2016 02/04/2016 05/22/2014  Decreased Interest 0 1 0 0 0 0 1  Down, Depressed,  Hopeless 0 1 0 0 0 0 0  PHQ - 2 Score 0 2 0 0 0 0 1  Altered sleeping - - - - - - 1  Tired, decreased energy - - - - - - 2  Change in appetite - - - - - - 0  Feeling bad or failure about yourself  - - - - - - 0  Trouble concentrating - - - - - - 1  Moving slowly or fidgety/restless - - - - - - 0  Suicidal thoughts - - - - - - 0  PHQ-9 Score - - - - - - 5    Review of Systems  Constitutional: Negative.   HENT: Negative.   Eyes: Negative.   Respiratory: Negative.   Cardiovascular: Negative.   Gastrointestinal: Negative.   Endocrine: Negative.   Genitourinary: Negative.   Musculoskeletal: Positive for neck pain and neck stiffness.  Skin: Negative.   Allergic/Immunologic: Negative.   Neurological: Positive for facial asymmetry.  Psychiatric/Behavioral: Negative.   All other systems reviewed and are negative.      Objective:   Physical Exam   General: No acute distress HEENT: EOMI, oral membranes moist Cards: reg rate  Chest: normal effort Abdomen: Soft, NT, ND Skin: dry, intact Extremities: no edema Neuro: left central 7 weakness, sensory loss to left face, tongue--stable Musculoskeletal: left SCM and trap tightness and palpable bands. . has difficult time turning head to right or to achieve a lateral lean.  Psych: pleasant           Assessment & Plan:  ASSESSMENT:  1. History of vestibular schwannoma resulting in trigeminal neuralgia and left facial nerve injury- no tumor regrowth per MRI--has chronic peri-operative scarring and atrophy which accounts for weakness, pain, and balance issues.              -stable at present. 2. History of cervicalgia and lumbar spine surgery---recent low back strain? 3. Chronic pain related to CN 5 involvement  4. Hypothyroid on supplementation  5. Myofascial pain left neck/shoulder. Suspect it's postural to how she favors the left side of her face.    6. Reactive depression and anxiety         PLAN:  1. Methadone 10 mg daily,  #30 refilled. RF today     Nucynta 50 mg 1 p.o. q.12 h. p.r.n. 30.  RF today -We will continue the controlled substance monitoring program, this  consists of regular clinic visits, examinations, routine drug screening, pill counts as well as use of [place] Controlled Substance Reporting System. [place] was reviewed today.   -Medication was refilled and a second prescription was sent to the patient's pharmacy for next month.          2. maintain HEP. Discussed potential outpt therapy to address myofascial components to neck pain along left SCM and trap. She will re-dedicate to exercises.  3.  Hypothyroid 4. continue with voltaren gel, topicals.   5. Eye mgt per ENT 6. 15 min of face to face patient care time were spent during this visit. All questions were encouraged and answered.  Follow up with me in 2 mos .

## 2019-09-03 ENCOUNTER — Telehealth: Payer: Self-pay | Admitting: Family

## 2019-09-03 NOTE — Telephone Encounter (Signed)
Left message for patient to call back and schedule Medicare Annual Wellness Visit (AWV) with Nurse Health Advisor   This should be a 70 MINUTE VISIT.  Last AWV 02/24/18

## 2019-10-17 ENCOUNTER — Encounter (INDEPENDENT_AMBULATORY_CARE_PROVIDER_SITE_OTHER): Payer: Medicare Other | Admitting: Ophthalmology

## 2019-10-17 ENCOUNTER — Other Ambulatory Visit: Payer: Self-pay

## 2019-10-17 DIAGNOSIS — H43813 Vitreous degeneration, bilateral: Secondary | ICD-10-CM | POA: Diagnosis not present

## 2019-10-17 DIAGNOSIS — I10 Essential (primary) hypertension: Secondary | ICD-10-CM | POA: Diagnosis not present

## 2019-10-17 DIAGNOSIS — H35033 Hypertensive retinopathy, bilateral: Secondary | ICD-10-CM | POA: Diagnosis not present

## 2019-10-17 DIAGNOSIS — H353132 Nonexudative age-related macular degeneration, bilateral, intermediate dry stage: Secondary | ICD-10-CM | POA: Diagnosis not present

## 2019-10-24 ENCOUNTER — Encounter: Payer: Self-pay | Admitting: Physical Medicine & Rehabilitation

## 2019-10-24 ENCOUNTER — Encounter: Payer: Medicare Other | Attending: Physical Medicine & Rehabilitation | Admitting: Physical Medicine & Rehabilitation

## 2019-10-24 ENCOUNTER — Other Ambulatory Visit: Payer: Self-pay

## 2019-10-24 VITALS — BP 173/84 | HR 83 | Temp 98.6°F | Ht 63.0 in | Wt 167.2 lb

## 2019-10-24 DIAGNOSIS — D333 Benign neoplasm of cranial nerves: Secondary | ICD-10-CM

## 2019-10-24 DIAGNOSIS — G894 Chronic pain syndrome: Secondary | ICD-10-CM | POA: Diagnosis present

## 2019-10-24 DIAGNOSIS — S0452XS Injury of facial nerve, left side, sequela: Secondary | ICD-10-CM

## 2019-10-24 DIAGNOSIS — G500 Trigeminal neuralgia: Secondary | ICD-10-CM | POA: Diagnosis not present

## 2019-10-24 DIAGNOSIS — G8929 Other chronic pain: Secondary | ICD-10-CM | POA: Diagnosis present

## 2019-10-24 DIAGNOSIS — Z79891 Long term (current) use of opiate analgesic: Secondary | ICD-10-CM | POA: Diagnosis present

## 2019-10-24 DIAGNOSIS — Z5181 Encounter for therapeutic drug level monitoring: Secondary | ICD-10-CM | POA: Insufficient documentation

## 2019-10-24 DIAGNOSIS — Z79899 Other long term (current) drug therapy: Secondary | ICD-10-CM | POA: Diagnosis present

## 2019-10-24 MED ORDER — METHADONE HCL 10 MG PO TABS: 10.0000 mg | ORAL_TABLET | Freq: Every day | ORAL | 0 refills | Status: DC

## 2019-10-24 MED ORDER — TAPENTADOL HCL 50 MG PO TABS: 50.0000 mg | ORAL_TABLET | Freq: Two times a day (BID) | ORAL | 0 refills | Status: DC | PRN

## 2019-10-24 NOTE — Progress Notes (Signed)
Subjective:    Patient ID: [name], female    DOB: May 09, 1945, 74 y.o.   MRN: [id number]  HPI   [name] is here in follow up of her chronic pain. She is taking the nucynta a little more at night. She may take it 2-3 x per week using 1/2 tablet typically.  Pain is affected by temperature as well as sleep and stress levels.  Her neck pain has improved significantly as she is taking a supplement now and also is doing her regular home exercise program which improved her range of motion quite a bit.  For her baseline pain she remains on methadone as prescribed.  She reports no other new health issues at present.  She continues to work part-time at [employer].     Pain Inventory Average Pain 7 Pain Right Now 5 My pain is sharp, burning and tingling  In the last 24 hours, has pain interfered with the following? General activity 8 Relation with others 9 Enjoyment of life 9 What TIME of day is your pain at its worst? daytime and evening Sleep (in general) Good  Pain is worse with: talking Pain improves with: rest, heat/ice and medication Relief from Meds: 5  Family History  Problem Relation Age of Onset  . Hypertension Mother   . Transient ischemic attack Mother   . Coronary artery disease Father        CHF  . Coronary artery disease Sister   . Macular degeneration Sister   . Transient ischemic attack Maternal Grandmother   . Diabetes Neg Hx   . Cancer Neg Hx    Social History   Socioeconomic History  . Marital status: Married    Spouse name: Not on file  . Number of children: 1  . Years of education: Not on file  . Highest education level: Not on file  Occupational History  . Occupation: Retired  Tobacco Use  . Smoking status: Never Smoker  . Smokeless tobacco: Never Used  Vaping Use  . Vaping Use: Never used  Substance and Sexual Activity  . Alcohol use: No    Alcohol/week: 0.0 standard drinks  . Drug use: No  . Sexual activity: Not on file  Other  Topics Concern  . Not on file  Social History Narrative  . Not on file   Social Determinants of Health   Financial Resource Strain:   . Difficulty of Paying Living Expenses: Not on file  Food Insecurity:   . Worried About Charity fundraiser in the Last Year: Not on file  . Ran Out of Food in the Last Year: Not on file  Transportation Needs:   . Lack of Transportation (Medical): Not on file  . Lack of Transportation (Non-Medical): Not on file  Physical Activity:   . Days of Exercise per Week: Not on file  . Minutes of Exercise per Session: Not on file  Stress:   . Feeling of Stress : Not on file  Social Connections:   . Frequency of Communication with Friends and Family: Not on file  . Frequency of Social Gatherings with Friends and Family: Not on file  . Attends Religious Services: Not on file  . Active Member of Clubs or Organizations: Not on file  . Attends Archivist Meetings: Not on file  . Marital Status: Not on file   Past Surgical History:  Procedure Laterality Date  . ABDOMINAL HYSTERECTOMY    . [place]  closed hole in heart  . CATARACT EXTRACTION Left 02/02/2016  . LUMBAR DISC SURGERY     Dr Eddie Dibbles  . no colonoscopy     "scared"; SOC reviewed  . 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 ABDOMINAL HYSTERECTOMY    . [place]   closed hole in heart  . CATARACT EXTRACTION Left 02/02/2016  . LUMBAR DISC SURGERY     Dr Eddie Dibbles  . no colonoscopy     "scared"; SOC reviewed  . 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 Abnormality of gait   . Brachial neuritis or radiculitis NOS   . Cervicalgia   . Facial nerve disorder   . Hearing loss    Left ear  . 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 Hyperlipidemia   . Hypertension   . Macular degeneration    . Myofascial pain   . Pneumonia 2005  . Thyroid disease    hyprothyroidism  . Trigeminal neuralgia    Temp 98.6 F (37 C)   Ht 5\' 3"  (1.6 m)   Wt 167 lb 3.2 oz (75.8 kg)   BMI 29.62 kg/m   Opioid Risk Score:   Fall Risk Score:  `1  Depression screen PHQ 2/9  Depression screen [facility] 2/9 04/24/2018 10/24/2017 06/20/2017 02/23/2017 12/29/2016 02/04/2016 05/22/2014  Decreased Interest 0 1 0 0 0 0 1  Down, Depressed, Hopeless 0 1 0 0 0 0 0  PHQ - 2 Score 0 2 0 0 0 0 1  Altered sleeping - - - - - - 1  Tired, decreased energy - - - - - - 2  Change in appetite - - - - - - 0  Feeling bad or failure about yourself  - - - - - - 0  Trouble concentrating - - - - - - 1  Moving slowly or fidgety/restless - - - - - - 0  Suicidal thoughts - - - - - - 0  PHQ-9 Score - - - - - - 5   Review of Systems  Constitutional: Positive for unexpected weight change.  HENT: Negative.   Eyes: Negative.   Respiratory: Negative.   Cardiovascular: Positive for leg swelling.  Gastrointestinal: Positive for constipation.  Endocrine: Negative.   Genitourinary: Positive for difficulty urinating.  Musculoskeletal: Negative.   Skin: Negative.   Allergic/Immunologic: Negative.   Neurological: Positive for weakness.  Hematological: Negative.   Psychiatric/Behavioral: The patient is nervous/anxious.   All other systems reviewed and are negative.      Objective:   Physical Exam  General: No acute distress HEENT: EOMI, oral membranes moist Cards: reg rate  Chest: normal effort Abdomen: Soft, NT, ND Skin: dry, intact Extremities: no edema Neuro: left C7, sensory loss to left face Musculoskeletal:  Much improved cervical range of motion today.  Left sternocleidomastoid muscle and trapezius muscles much more flexible.   Psych: pleasant           Assessment & Plan:  ASSESSMENT:  1. History of vestibular schwannoma resulting in trigeminal neuralgia and left facial nerve injury- no tumor regrowth per MRI--has  chronic peri-operative scarring and atrophy which accounts for weakness, pain, and balance issues.              -  stable neuro presentation. 2. History of cervicalgia and lumbar spine surgery---recent low back strain? 3. Chronic pain related to CN 5 involvement  4. Hypothyroid on supplementation  5. Myofascial pain left neck/shoulder.  Seems improved today.   6. Reactive depression and anxiety         PLAN:  1. Methadone 10 mg daily, #30 refilled.   Refilled today     Nucynta 50 mg 1 p.o. q.12 h. p.r.n. 30.    Refilled today We will continue the controlled substance monitoring program, this consists of regular clinic visits, examinations, routine drug screening, pill counts as well as use of [place] Controlled Substance Reporting System. [place] was reviewed today.  .   Medication was refilled and a second prescription was sent to the patient's pharmacy for next month.  .           2. Maintain HEP. It is helping her neck ROM significantly.  Discussed importance of daily posture especially when she is at work and using the phone. 3.  Hypothyroid 4. continue with voltaren gel, topicals.   5. Eye mgt per ENT 6. 15 min of face to face patient care time were spent during this visit. All questions were encouraged and answered.  Follow up with me in 2 mos .

## 2019-10-24 NOTE — Patient Instructions (Signed)
PLEASE FEEL FREE TO CALL OUR OFFICE WITH ANY PROBLEMS OR QUESTIONS ([phone])      

## 2019-12-25 ENCOUNTER — Encounter: Payer: Medicare Other | Attending: Physical Medicine & Rehabilitation | Admitting: Registered Nurse

## 2019-12-25 ENCOUNTER — Other Ambulatory Visit: Payer: Self-pay

## 2019-12-25 ENCOUNTER — Encounter: Payer: Self-pay | Admitting: Registered Nurse

## 2019-12-25 VITALS — BP 175/82 | HR 99 | Temp 98.7°F | Ht 63.0 in | Wt 167.6 lb

## 2019-12-25 DIAGNOSIS — G500 Trigeminal neuralgia: Secondary | ICD-10-CM

## 2019-12-25 DIAGNOSIS — Z79899 Other long term (current) drug therapy: Secondary | ICD-10-CM

## 2019-12-25 DIAGNOSIS — Z5181 Encounter for therapeutic drug level monitoring: Secondary | ICD-10-CM

## 2019-12-25 DIAGNOSIS — D333 Benign neoplasm of cranial nerves: Secondary | ICD-10-CM | POA: Diagnosis not present

## 2019-12-25 DIAGNOSIS — I10 Essential (primary) hypertension: Secondary | ICD-10-CM

## 2019-12-25 DIAGNOSIS — S0452XS Injury of facial nerve, left side, sequela: Secondary | ICD-10-CM

## 2019-12-25 DIAGNOSIS — G894 Chronic pain syndrome: Secondary | ICD-10-CM | POA: Diagnosis not present

## 2019-12-25 MED ORDER — METHADONE HCL 10 MG PO TABS: 10.0000 mg | ORAL_TABLET | Freq: Every day | ORAL | 0 refills | Status: DC

## 2019-12-25 MED ORDER — TAPENTADOL HCL 50 MG PO TABS: 50.0000 mg | ORAL_TABLET | Freq: Two times a day (BID) | ORAL | 0 refills | Status: DC | PRN

## 2019-12-25 NOTE — Patient Instructions (Signed)
Call or send Zella Ball a My-Chart regarding your January 6th appointment.

## 2019-12-25 NOTE — Progress Notes (Signed)
Subjective:    Patient ID: [name], female    DOB: 1945/02/26, 74 y.o.   MRN: [id number]  HPI: [name] is a 74 y.o. female who returns for follow up appointment for chronic pain and medication refill. She states her pain is located in her neck radiating into her left shoulder. Also reports left facial numbness and tingling and increase intensity with neuralgia, she was encouraged to take her Nucynta as prescribed, she verbalizes understanding. Also instructed to call or send a My-Chart message in a few weeks to evaluate medication, she verbalizes understanding. She rates her pain 6. Her current exercise regime is walking and performing stretching exercises.  [name] reports she's working three days a week.  [name] arrived hypertensive, blood pressure was re-checked, she reports she's compliant with her anti-hypertensive medication. [name] refuses ED evaluation, she will call her Cardiologist today. Also instructed to keep a blood pressure log, she verbalizes understanding.   [name] Morphine equivalent is 80.00 MME.    Last UDS was Performed on 06/20/2019, it was consistent.   Pain Inventory Average Pain 8 Pain Right Now 6 My pain is constant, burning and tingling  In the last 24 hours, has pain interfered with the following? General activity 9 Relation with others 9 Enjoyment of life 8 What TIME of day is your pain at its worst? daytime and evening Sleep (in general) Good  Pain is worse with: talking Pain improves with: rest, heat/ice and medication Relief from Meds: 8  Family History  Problem Relation Age of Onset  . Hypertension Mother   . Transient ischemic attack Mother   . Coronary artery disease Father        CHF  . Coronary artery disease Sister   . Macular degeneration Sister   . Transient ischemic attack Maternal Grandmother   . Diabetes Neg Hx   . Cancer Neg Hx    Social History   Socioeconomic History  . Marital  status: Married    Spouse name: Not on file  . Number of children: 1  . Years of education: Not on file  . Highest education level: Not on file  Occupational History  . Occupation: Retired  Tobacco Use  . Smoking status: Never Smoker  . Smokeless tobacco: Never Used  Vaping Use  . Vaping Use: Never used  Substance and Sexual Activity  . Alcohol use: No    Alcohol/week: 0.0 standard drinks  . Drug use: No  . Sexual activity: Not on file  Other Topics Concern  . Not on file  Social History Narrative  . Not on file   Social Determinants of Health   Financial Resource Strain:   . Difficulty of Paying Living Expenses: Not on file  Food Insecurity:   . Worried About Charity fundraiser in the Last Year: Not on file  . Ran Out of Food in the Last Year: Not on file  Transportation Needs:   . Lack of Transportation (Medical): Not on file  . Lack of Transportation (Non-Medical): Not on file  Physical Activity:   . Days of Exercise per Week: Not on file  . Minutes of Exercise per Session: Not on file  Stress:   . Feeling of Stress : Not on file  Social Connections:   . Frequency of Communication with Friends and Family: Not on file  . Frequency of Social Gatherings with Friends and Family: Not on file  . Attends Religious Services: Not on file  .  Active Member of Clubs or Organizations: Not on file  . Attends Archivist Meetings: Not on file  . Marital Status: Not on file   Past Surgical History:  Procedure Laterality Date  . ABDOMINAL HYSTERECTOMY    . [place]   closed hole in heart  . CATARACT EXTRACTION Left 02/02/2016  . LUMBAR DISC SURGERY     Dr Eddie Dibbles  . no colonoscopy     "scared"; SOC reviewed  . 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 ABDOMINAL HYSTERECTOMY    . [place]   closed hole in heart  . CATARACT EXTRACTION Left 02/02/2016    . LUMBAR DISC SURGERY     Dr Eddie Dibbles  . no colonoscopy     "scared"; SOC reviewed  . 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 Abnormality of gait   . Brachial neuritis or radiculitis NOS   . Cervicalgia   . Facial nerve disorder   . Hearing loss    Left ear  . 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 Hyperlipidemia   . Hypertension   . Macular degeneration   . Myofascial pain   . Pneumonia 2005  . Thyroid disease    hyprothyroidism  . Trigeminal neuralgia    There were no vitals taken for this visit.  Opioid Risk Score:   Fall Risk Score:  `1  Depression screen PHQ 2/9  Depression screen [facility] 2/9 04/24/2018 10/24/2017 06/20/2017 02/23/2017 12/29/2016 02/04/2016 05/22/2014  Decreased Interest 0 1 0 0 0 0 1  Down, Depressed, Hopeless 0 1 0 0 0 0 0  PHQ - 2 Score 0 2 0 0 0 0 1  Altered sleeping - - - - - - 1  Tired, decreased energy - - - - - - 2  Change in appetite - - - - - - 0  Feeling bad or failure about yourself  - - - - - - 0  Trouble concentrating - - - - - - 1  Moving slowly or fidgety/restless - - - - - - 0  Suicidal thoughts - - - - - - 0  PHQ-9 Score - - - - - - 5   Review of Systems  Neurological:       Right side of face pain  All other systems reviewed and are negative.      Objective:   Physical Exam Vitals and nursing note reviewed.  Constitutional:      Appearance: Normal appearance.  Cardiovascular:     Rate and Rhythm: Normal rate and regular rhythm.     Pulses: Normal pulses.     Heart sounds: Normal heart sounds.  Pulmonary:     Effort: Pulmonary effort is normal.     Breath sounds: Normal breath sounds.  Musculoskeletal:     Cervical back: Normal range of motion and neck supple.     Comments: Normal Muscle Bulk and Muscle Testing Reveals:  Upper Extremities: Full ROM and Muscle Strength 5/5 Thoracic Hypersensitivity: T-1-T-7  Lower Extremities: Full ROM  and Muscle Strength 5/5 Arises from chair with ease Narrow Based  Gait   Skin:    General: Skin is warm and dry.  Neurological:  Mental Status: She is alert and oriented to person, place, and time.  Psychiatric:        Mood and Affect: Mood normal.        Behavior: Behavior normal.           Assessment & Plan:  1. History of vestibular schwannoma resulting in trigeminal neuralgia and left facial nerve injury. 12/25/2019. Refilled: Methadone 10 mg daily #30, second script sent for the following month and Nucunta 50 mg one tablet every 12 hours as needed #30.  We will continue the opioid monitoring program, this consists of regular clinic visits, examinations, urine drug screen, pill counts as well as use of [place] Controlled Substance Reporting system. A 12 month History has been reviewed on the [place] on 12/25/2019.  2. History of cervicalgia/ Cervical Radiculitis: Continue current medication regimen. . Continue HEP and Continue to Monitor.12/25/2019 3. Chronic pain related to CN 5 involvement: Continue Voltaren Gel and current medication regimen. Continue to monitor. 12/25/2019  77minutes of face to face patient care time wasspent during this visit. All questions were encouraged and answered.   F/U in 2 months

## 2019-12-29 LAB — DRUG TOX MONITOR 1 W/CONF, ORAL FLD
Alprazolam: NEGATIVE ng/mL (ref <0.50)
Amphetamines: NEGATIVE ng/mL (ref <10)
Barbiturates: NEGATIVE ng/mL (ref <10)
Benzodiazepines: POSITIVE ng/mL — AB (ref <0.50)
Buprenorphine: NEGATIVE ng/mL (ref <0.10)
Chlordiazepoxide: NEGATIVE ng/mL (ref <0.50)
Clonazepam: NEGATIVE ng/mL (ref <0.50)
Cocaine: NEGATIVE ng/mL (ref <5.0)
Diazepam: NEGATIVE ng/mL (ref <0.50)
EDDP: NEGATIVE ng/mL (ref <5.0)
Fentanyl: NEGATIVE ng/mL (ref <0.10)
Flunitrazepam: NEGATIVE ng/mL (ref <0.50)
Flurazepam: NEGATIVE ng/mL (ref <0.50)
Heroin Metabolite: NEGATIVE ng/mL (ref <1.0)
Lorazepam: NEGATIVE ng/mL (ref <0.50)
MARIJUANA: NEGATIVE ng/mL (ref <2.5)
MDMA: NEGATIVE ng/mL (ref <10)
Meprobamate: NEGATIVE ng/mL (ref <2.5)
Methadone: 68.1 ng/mL — ABNORMAL HIGH (ref <5.0)
Methadone: POSITIVE ng/mL — AB (ref <5.0)
Midazolam: NEGATIVE ng/mL (ref <0.50)
Nicotine Metabolite: NEGATIVE ng/mL (ref <5.0)
Nordiazepam: 0.62 ng/mL — ABNORMAL HIGH (ref <0.50)
Opiates: NEGATIVE ng/mL (ref <2.5)
Oxazepam: NEGATIVE ng/mL (ref <0.50)
Phencyclidine: NEGATIVE ng/mL (ref <10)
Tapentadol: 18.6 ng/mL — ABNORMAL HIGH (ref <5.0)
Tapentadol: POSITIVE ng/mL — AB (ref <5.0)
Temazepam: NEGATIVE ng/mL (ref <0.50)
Tramadol: NEGATIVE ng/mL (ref <5.0)
Triazolam: NEGATIVE ng/mL (ref <0.50)
Zolpidem: NEGATIVE ng/mL (ref <5.0)

## 2019-12-29 LAB — DRUG TOX ALC METAB W/CON, ORAL FLD: Alcohol Metabolite: NEGATIVE ng/mL (ref <25)

## 2020-01-03 ENCOUNTER — Telehealth: Payer: Self-pay | Admitting: *Deleted

## 2020-01-03 NOTE — Telephone Encounter (Signed)
Oral swab drug screen was consistent for prescribed medications.  ?

## 2020-01-07 NOTE — Progress Notes (Signed)
CARDIOLOGY OFFICE NOTE  Date:  01/21/2020    [name] Date of Birth: [birth year] Medical Record #[id number]  PCP:  Traci Alar, NP  Cardiologist:  Jennings Books  Chief Complaint  Patient presents with  . Follow-up    Seen for Dr. Tamala Julian    History of Present Illness: [name] is a 74 y.o. female who presents today for a follow up visit. Seen for Dr. Tamala Julian.   She has a history of long standing HTN,paroxysmal supraventricular tachycardia treated with ablation in 2005and reported congenital heart disease with "being a blue baby and had valve surgery as a child" with what sounds like thru a thoracotomy.Prior brain tumor with subsequent chronic nerve pain and left sided facial numbness - requiring Methadone. Chronically abnormal EKG.   I last saw her in January of 2020 - she saw Dr. Tamala Julian in February of 2020 - inconsistenatly taking her medicines. Overall felt to be doing ok.   Comes in today. Here alone. She is doing ok - notes lots of anxieties. Still working about 20 hours per week. No real exercise. She is eating lots of takeout/delivery - did not realize that sodium is salt. She uses Lasix as needed - has been out of this for about a week. Will have some occasional lower extremity edema. BP and HR tend to run higher when she comes into the office. BP about 150 at home. No chest pain. Breathing seems fairly stable. No real palpitations unless she gets anxious. Her labs are checked by PCP.   Past Medical History:  Diagnosis Date  . Abnormality of gait   . Brachial neuritis or radiculitis NOS   . Cervicalgia   . Facial nerve disorder   . Hearing loss    Left ear  . 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 Hyperlipidemia   . Hypertension   . Macular degeneration   . Myofascial pain   . Pneumonia 2005  . Thyroid disease    hyprothyroidism  . Trigeminal neuralgia     Past Surgical History:  Procedure Laterality Date  .  ABDOMINAL HYSTERECTOMY    . [place]   closed hole in heart  . CATARACT EXTRACTION Left 02/02/2016  . LUMBAR DISC SURGERY     Dr Eddie Dibbles  . no colonoscopy     "scared"; SOC reviewed  . 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; 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     Medications: Current Meds  Medication Sig  . clorazepate (TRANXENE) 7.5 MG tablet Take 7.5 mg by mouth 2 (two) times daily as needed for anxiety.  . diclofenac sodium (VOLTAREN) 1 % GEL Apply 1 application topically 3 (three) times daily. To left face  . estradiol (ESTRACE) 0.5 MG tablet Take 1 tablet by mouth daily. q  . furosemide (LASIX) 40 MG tablet Take 1 tablet (40 mg total) by mouth daily as needed for fluid or edema.  . irbesartan (AVAPRO) 300 MG tablet Take 1 tablet (300 mg total) by mouth daily.  Marland Kitchen levothyroxine (SYNTHROID) 100 MCG tablet Take 100 mcg by mouth every morning.  . methadone (DOLOPHINE) 10 MG tablet Take 1 tablet (10 mg total) by mouth daily.  . Multiple Vitamins-Minerals (PRESERVISION AREDS PO) Take 1 capsule by mouth daily.   . tapentadol (NUCYNTA) 50 MG tablet Take 1 tablet (50 mg total) by mouth every 12 (twelve) hours as needed.  . [  DISCONTINUED] furosemide (LASIX) 40 MG tablet Take 40 mg by mouth daily.  . [DISCONTINUED] hydrochlorothiazide (MICROZIDE) 12.5 MG capsule Take 12.5 mg by mouth daily.     Allergies: Allergies  Allergen Reactions  . Morphine     REACTION: GI upset    Social History: The patient  reports that she has never smoked. She has never used smokeless tobacco. She reports that she does not drink alcohol and does not use drugs.   Family History: The patient's family history includes Coronary artery disease in her father and sister; Hypertension in her mother; Macular degeneration in her sister; Transient ischemic attack in her maternal grandmother and mother.   Review of Systems: Please see the history of present illness.   All other systems are reviewed and  negative.   Physical Exam: VS:  BP (!) 180/68   Pulse (!) 103   Ht 5\' 3"  (1.6 m)   Wt 171 lb (77.6 kg)   SpO2 99%   BMI 30.29 kg/m  .  BMI Body mass index is 30.29 kg/m.  Wt Readings from Last 3 Encounters:  01/21/20 171 lb (77.6 kg)  12/25/19 167 lb 9.6 oz (76 kg)  10/24/19 167 lb 3.2 oz (75.8 kg)   BP is 180/80 by me.  General: Alert and in no acute distress.  She looks younger than her stated age.  HEENT: Normal. But has left sided facial numbness.  Neck: Supple, no JVD, carotid bruits, or masses noted.  Cardiac: Regular rate and rhythm. No murmurs, rubs, or gallops. No edema.  Respiratory:  Lungs are clear to auscultation bilaterally with normal work of breathing.  GI: Soft and nontender.  MS: No deformity or atrophy. Gait and ROM intact.  Skin: Warm and dry. Color is normal.  Neuro:  Strength and sensation are intact and no gross focal deficits noted.  Psych: Alert, appropriate and with normal affect.   LABORATORY DATA:  EKG:  EKG  ordered today.  Personally reviewed by me. This demonstrates sinus tach with diffuse T wave changes - unchanged.  Lab Results  Component Value Date   WBC 8.9 02/20/2018   HGB 12.9 02/20/2018   HCT 38.5 02/20/2018   PLT 294 02/20/2018   GLUCOSE 113 (H) 02/20/2018   CHOL 260 (H) 12/30/2017   TRIG 261.0 (H) 12/30/2017   HDL 52.50 12/30/2017   LDLDIRECT 186.0 12/30/2017   LDLCALC 155 (H) 08/01/2014   ALT 12 12/07/2017   AST 13 12/07/2017   NA 145 (H) 02/20/2018   K 4.8 02/20/2018   CL 105 02/20/2018   CREATININE 0.93 02/20/2018   BUN 17 02/20/2018   CO2 25 02/20/2018   TSH 5.62 (H) 02/24/2018   HGBA1C 6.1 02/24/2018     BNP (last 3 results) No results for input(s): BNP in the last 8760 hours.  ProBNP (last 3 results) No results for input(s): PROBNP in the last 8760 hours.   Other Studies Reviewed Today:  Myocardial perfusion imaging March 06, 2018: Study Highlights     The left ventricular ejection fraction is  hyperdynamic (>65%).  Nuclear stress EF: 79%.  The study is normal.  This is a low risk study.  Normal stress nuclear study with no ischemia or infarction; EF 79 with normal wall motion.   2D Doppler echocardiogram 12/14/2017: Study Conclusions  - Left ventricle: The cavity size was normal. Systolic function was normal. The estimated ejection fraction was in the range of 60% to 65%. Wall motion was normal; there were no regional  wall motion abnormalities. Left ventricular diastolic function parameters were normal. - Mitral valve: Moderately calcified annulus. Moderately thickened, moderately calcified leaflets . - Pulmonary arteries: PA peak pressure: 31 mm Hg (S).     ASSESSMENT & PLAN:    1. PSVT - has not recurred. HR can run a little fast - she has not taken Toprol as recommended.   2. Chronic diastolic HF - most likely getting too sodium - HCTZ is increased today for better BP control as well.   3. HTN - not at goal - increasing her HCTZ today - cutting back on sodium/salt is imperative.   4. Congenital heart disease  5. HLD - has not wanted to be on statin - she will be having labs next month with PCP  6. Chronically abnormal EKG   Current medicines are reviewed with the patient today.  The patient does not have concerns regarding medicines other than what has been noted above.  The following changes have been made:  See above.  Labs/ tests ordered today include:    Orders Placed This Encounter  Procedures  . Basic metabolic panel  . EKG 12-Lead     Disposition:   FU with Dr. Tamala Julian in a couple of months. BMET today. HCTZ increased. Salt/sodium restriction imperative.    Patient is agreeable to this plan and will call if any problems develop in the interim.   SignedTruitt Merle, NP  01/21/2020 3:10 PM  [facility] [address] [place], [place]  [address] Phone: [phone] Fax: [phone]

## 2020-01-21 ENCOUNTER — Other Ambulatory Visit: Payer: Self-pay

## 2020-01-21 ENCOUNTER — Encounter: Payer: Self-pay | Admitting: Nurse Practitioner

## 2020-01-21 ENCOUNTER — Ambulatory Visit: Payer: Medicare Other | Admitting: Nurse Practitioner

## 2020-01-21 VITALS — BP 180/68 | HR 103 | Ht 63.0 in | Wt 171.0 lb

## 2020-01-21 DIAGNOSIS — I471 Supraventricular tachycardia: Secondary | ICD-10-CM | POA: Diagnosis not present

## 2020-01-21 DIAGNOSIS — E782 Mixed hyperlipidemia: Secondary | ICD-10-CM

## 2020-01-21 DIAGNOSIS — I10 Essential (primary) hypertension: Secondary | ICD-10-CM

## 2020-01-21 MED ORDER — HYDROCHLOROTHIAZIDE 25 MG PO TABS: 25.0000 mg | ORAL_TABLET | Freq: Every day | ORAL | 3 refills | Status: DC

## 2020-01-21 MED ORDER — FUROSEMIDE 40 MG PO TABS: 40.0000 mg | ORAL_TABLET | Freq: Every day | ORAL | 6 refills | Status: AC | PRN

## 2020-01-21 NOTE — Patient Instructions (Addendum)
After Visit Summary:  We will be checking the following labs today - BMET   Medication Instructions:    Continue with your current medicines. BUT  I am going to increase the HCTZ to 25 mg a day   I have refilled the Lasix to be used as needed.    If you need a refill on your cardiac medications before your next appointment, please call your pharmacy.     Testing/Procedures To Be Arranged:  N/A  Follow-Up:   See Dr. Tamala Julian in about 2 months.    At [facility], you and your health needs are our priority.  As part of our continuing mission to provide you with exceptional heart care, we have created designated Provider Care Teams.  These Care Teams include your primary Cardiologist (physician) and Advanced Practice Providers (APPs -  Physician Assistants and Nurse Practitioners) who all work together to provide you with the care you need, when you need it.  Special Instructions:  . Stay safe, wash your hands for at least 20 seconds and wear a mask when needed.  . It was good to talk with you today.  . Try to cut back on the salt/sodium intake . Keep a check on your BP for [place].    Call the [place] office at [phone] if you have any questions, problems or concerns.

## 2020-01-22 LAB — BASIC METABOLIC PANEL
BUN/Creatinine Ratio: 19 (ref 12–28)
BUN: 15 mg/dL (ref 8–27)
CO2: 25 mmol/L (ref 20–29)
Calcium: 10.1 mg/dL (ref 8.7–10.3)
Chloride: 104 mmol/L (ref 96–106)
Creatinine, Ser: 0.78 mg/dL (ref 0.57–1.00)
GFR calc Af Amer: 85 mL/min/{1.73_m2} (ref >59)
GFR calc non Af Amer: 74 mL/min/{1.73_m2} (ref >59)
Glucose: 103 mg/dL — ABNORMAL HIGH (ref 65–99)
Potassium: 4.7 mmol/L (ref 3.5–5.2)
Sodium: 143 mmol/L (ref 134–144)

## 2020-01-23 ENCOUNTER — Telehealth: Payer: Self-pay | Admitting: Nurse Practitioner

## 2020-01-23 NOTE — Telephone Encounter (Signed)
Follow Up:    Husband returning [name]'s call, concerning pt;s lab results.

## 2020-01-23 NOTE — Telephone Encounter (Signed)
Attempted to return patients husbands call, no answer. Left a message to call the office back.

## 2020-02-21 ENCOUNTER — Encounter: Payer: Self-pay | Admitting: Registered Nurse

## 2020-02-21 ENCOUNTER — Encounter: Payer: Medicare Other | Attending: Physical Medicine & Rehabilitation | Admitting: Registered Nurse

## 2020-02-21 ENCOUNTER — Other Ambulatory Visit: Payer: Self-pay

## 2020-02-21 VITALS — BP 144/70 | HR 85 | Temp 97.8°F | Ht 63.0 in | Wt 169.0 lb

## 2020-02-21 DIAGNOSIS — G500 Trigeminal neuralgia: Secondary | ICD-10-CM | POA: Diagnosis present

## 2020-02-21 DIAGNOSIS — G894 Chronic pain syndrome: Secondary | ICD-10-CM | POA: Diagnosis present

## 2020-02-21 DIAGNOSIS — Z5181 Encounter for therapeutic drug level monitoring: Secondary | ICD-10-CM

## 2020-02-21 DIAGNOSIS — S0452XS Injury of facial nerve, left side, sequela: Secondary | ICD-10-CM | POA: Diagnosis present

## 2020-02-21 DIAGNOSIS — Z79899 Other long term (current) drug therapy: Secondary | ICD-10-CM

## 2020-02-21 DIAGNOSIS — D333 Benign neoplasm of cranial nerves: Secondary | ICD-10-CM | POA: Diagnosis not present

## 2020-02-21 MED ORDER — METHADONE HCL 10 MG PO TABS: 10.0000 mg | ORAL_TABLET | Freq: Every day | ORAL | 0 refills | Status: DC

## 2020-02-21 MED ORDER — TAPENTADOL HCL 50 MG PO TABS: 50.0000 mg | ORAL_TABLET | Freq: Two times a day (BID) | ORAL | 0 refills | Status: DC | PRN

## 2020-02-21 NOTE — Progress Notes (Signed)
Subjective:    Patient ID: [name], female    DOB: Nov 13, 1945, 75 y.o.   MRN: [id number]  HPI: [name] is a 75 y.o. female who returns for follow up appointment for chronic pain and medication refill. She reports left facial pain with tingling and numbness. She rates her pain 8. Her current exercise regime is walking and performing stretching exercises.  [name] Morphine equivalent is 30.00  MME.   Last Oral Swab was Performed on 12/25/2019, it was consistent.    Pain Inventory Average Pain 8 Pain Right Now 8 My pain is sharp, burning and tingling  In the last 24 hours, has pain interfered with the following? General activity 7 Relation with others 8 Enjoyment of life 9 What TIME of day is your pain at its worst? daytime and evening Sleep (in general) Good  Pain is worse with: talking Pain improves with: heat/ice and medication Relief from Meds: 4  Family History  Problem Relation Age of Onset  . Hypertension Mother   . Transient ischemic attack Mother   . Coronary artery disease Father        CHF  . Coronary artery disease Sister   . Macular degeneration Sister   . Transient ischemic attack Maternal Grandmother   . Diabetes Neg Hx   . Cancer Neg Hx    Social History   Socioeconomic History  . Marital status: Married    Spouse name: Not on file  . Number of children: 1  . Years of education: Not on file  . Highest education level: Not on file  Occupational History  . Occupation: Retired  Tobacco Use  . Smoking status: Never Smoker  . Smokeless tobacco: Never Used  Vaping Use  . Vaping Use: Never used  Substance and Sexual Activity  . Alcohol use: No    Alcohol/week: 0.0 standard drinks  . Drug use: No  . Sexual activity: Not on file  Other Topics Concern  . Not on file  Social History Narrative  . Not on file   Social Determinants of Health   Financial Resource Strain: Not on file  Food Insecurity: Not on file   Transportation Needs: Not on file  Physical Activity: Not on file  Stress: Not on file  Social Connections: Not on file   Past Surgical History:  Procedure Laterality Date  . ABDOMINAL HYSTERECTOMY    . CARDIAC SURGERY  1955   closed hole in heart  . CATARACT EXTRACTION Left 02/02/2016  . LUMBAR DISC SURGERY     Dr Renae Fickle  . no colonoscopy     "scared"; SOC reviewed  . 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 ABDOMINAL HYSTERECTOMY    . CARDIAC SURGERY  1955   closed hole in heart  . CATARACT EXTRACTION Left 02/02/2016  . LUMBAR DISC SURGERY     Dr Renae Fickle  . no colonoscopy     "scared"; SOC reviewed  . 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 Abnormality of gait   . Brachial neuritis or radiculitis NOS   . Cervicalgia   . Facial nerve disorder   . Hearing loss    Left ear  . 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  Hyperlipidemia   . Hypertension   . Macular degeneration   . Myofascial pain   . Pneumonia 2005  . Thyroid disease    hyprothyroidism  . Trigeminal neuralgia    BP (!) 144/70   Pulse 85   Temp 97.8 F (36.6 C)   Ht 5\' 3"  (1.6 m)   Wt 169 lb (76.7 kg)   SpO2 97%   BMI 29.94 kg/m   Opioid Risk Score:   Fall Risk Score:  `1  Depression screen PHQ 2/9  Depression screen [facility] 2/9 12/25/2019 04/24/2018 10/24/2017 06/20/2017 02/23/2017 12/29/2016 02/04/2016  Decreased Interest 0 0 1 0 0 0 0  Down, Depressed, Hopeless 0 0 1 0 0 0 0  PHQ - 2 Score 0 0 2 0 0 0 0  Altered sleeping - - - - - - -  Tired, decreased energy - - - - - - -  Change in appetite - - - - - - -  Feeling bad or failure about yourself  - - - - - - -  Trouble concentrating - - - - - - -  Moving slowly or fidgety/restless - - - - - - -  Suicidal thoughts - - - - - - -  PHQ-9 Score - - - - - - -    Review of  Systems  Constitutional: Negative.   HENT: Negative.   Eyes: Negative.   Respiratory: Negative.   Cardiovascular: Negative.   Gastrointestinal: Negative.   Endocrine: Negative.   Genitourinary: Negative.   Musculoskeletal: Positive for arthralgias.  Allergic/Immunologic: Negative.   Neurological: Negative.   Hematological: Negative.   Psychiatric/Behavioral: Negative.   All other systems reviewed and are negative.      Objective:   Physical Exam Vitals and nursing note reviewed.  Constitutional:      Appearance: Normal appearance.  Cardiovascular:     Rate and Rhythm: Normal rate and regular rhythm.     Pulses: Normal pulses.     Heart sounds: Normal heart sounds.  Pulmonary:     Effort: Pulmonary effort is normal.     Breath sounds: Normal breath sounds.  Musculoskeletal:     Cervical back: Normal range of motion and neck supple.     Comments: Normal Muscle Bulk and Muscle Testing Reveals:  Upper Extremities: Full ROM and Muscle Strength 5/5  Lower Extremities: Full ROM and Muscle Strength 5/5 Arises from Table with Ease Narrow Based Gait   Skin:    General: Skin is warm and dry.  Neurological:     Mental Status: She is alert and oriented to person, place, and time.  Psychiatric:        Mood and Affect: Mood normal.        Behavior: Behavior normal.           Assessment & Plan:  1. History of vestibular schwannoma resulting in trigeminal neuralgia and left facial nerve injury. 02/21/2020. Refilled: Methadone 10 mgdaily #30, second script sent for the following monthand Nucunta 50 mg one tablet every 12 hours as needed #30.  We will continue the opioid monitoring program, this consists of regular clinic visits, examinations, urine drug screen, pill counts as well as use of [place] Controlled Substance Reporting system. A 12 month History has been reviewed on the [place] on 02/21/2020.  2. History of  cervicalgia/ Cervical Radiculitis:Continue current medication regimen. .Continue HEP and Continue to Monitor.02/21/2020 3. 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 Continue to monitor.02/21/2020  F/U in 2 months

## 2020-04-09 NOTE — Progress Notes (Signed)
Cardiology Office Note:    Date:  04/10/2020   ID:  [name], DOB January 29, 1946, MRN [id number]  PCP:  Debbrah Alar, NP  Cardiologist:  Sinclair Grooms, MD   Referring MD: Debbrah Alar, NP   No chief complaint on file.   History of Present Illness:    [name] is a 75 y.o. female with a hx of HTN,paroxysmal supraventricular tachycardia treated with ablation in 2005and reported congenital heart disease with "being a blue baby and had valve surgery as a child" with what sounds like thru a thoracotomy.Prior brain tumor with subsequent nerve pain and left sided facial numbness - requiring Methadone.   Still having palpitations.  Still having exertional dyspnea.  Improved since adding HCTZ.  Past Medical History:  Diagnosis Date   Abnormality of gait    Brachial neuritis or radiculitis NOS    Cervicalgia    Facial nerve disorder    Hearing loss    Left ear   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   Hyperlipidemia    Hypertension    Macular degeneration    Myofascial pain    Pneumonia 2005   Thyroid disease    hyprothyroidism   Trigeminal neuralgia     Past Surgical History:  Procedure Laterality Date   [place]   closed hole in heart   CATARACT EXTRACTION Left 02/02/2016   LUMBAR DISC SURGERY     Dr Eddie Dibbles   no colonoscopy     "scared"; [facility] reviewed   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; 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    Current Medications: Current Meds  Medication Sig   clorazepate (TRANXENE) 7.5 MG tablet Take 7.5 mg by mouth 2 (two) times daily as needed for anxiety.   diclofenac sodium (VOLTAREN) 1 % GEL Apply 1 application topically 3 (three) times daily. To left face   estradiol (ESTRACE) 0.5 MG tablet Take 1 tablet by mouth daily. q   furosemide (LASIX) 40 MG tablet Take 1 tablet (40 mg total) by mouth daily as needed for fluid or edema.    hydrochlorothiazide (HYDRODIURIL) 25 MG tablet Take 1 tablet (25 mg total) by mouth daily.   irbesartan (AVAPRO) 300 MG tablet Take 1 tablet (300 mg total) by mouth daily.   levothyroxine (SYNTHROID) 100 MCG tablet Take 100 mcg by mouth every morning.   methadone (DOLOPHINE) 10 MG tablet Take 1 tablet (10 mg total) by mouth daily.   metoprolol succinate (TOPROL XL) 25 MG 24 hr tablet Take 1 tablet (25 mg total) by mouth daily.   Multiple Vitamins-Minerals (PRESERVISION AREDS PO) Take 1 capsule by mouth daily.    tapentadol (NUCYNTA) 50 MG tablet Take 1 tablet (50 mg total) by mouth every 12 (twelve) hours as needed.     Allergies:   Morphine   Social History   Socioeconomic History   Marital status: Married    Spouse name: Not on file   Number of children: 1   Years of education: Not on file   Highest education level: Not on file  Occupational History   Occupation: Retired  Tobacco Use   Smoking status: Never Smoker   Smokeless tobacco: Never Used  Scientific laboratory technician Use: Never used  Substance and Sexual Activity   Alcohol use: No    Alcohol/week: 0.0 standard drinks   Drug use: No  Sexual activity: Not on file  Other Topics Concern   Not on file  Social History Narrative   Not on file   Social Determinants of Health   Financial Resource Strain: Not on file  Food Insecurity: Not on file  Transportation Needs: Not on file  Physical Activity: Not on file  Stress: Not on file  Social Connections: Not on file     Family History: The patient's family history includes Coronary artery disease in her father and sister; Hypertension in her mother; Macular degeneration in her sister; Transient ischemic attack in her maternal grandmother and mother. There is no history of Diabetes or Cancer.  ROS:   Please see the history of present illness.    Hard of hearing.  Still having some palpitations.  States that when she has palpitations she is short of breath.   There is a little bit of chest tightness associated with it.  All other systems reviewed and are negative.  EKGs/Labs/Other Studies Reviewed:    The following studies were reviewed today: Myocardial perfusion imaging January 2020: Study Highlights    The left ventricular ejection fraction is hyperdynamic (>65%).  Nuclear stress EF: 79%.  The study is normal.  This is a low risk study.   Normal stress nuclear study with no ischemia or infarction; EF 79 with normal wall motion.   EKG:  EKG no new data  Recent Labs: 01/21/2020: BUN 15; Creatinine, Ser 0.78; Potassium 4.7; Sodium 143  Recent Lipid Panel    Component Value Date/Time   CHOL 260 (H) 12/30/2017 0919   CHOL 252 (H) 12/22/2012 0848   TRIG 261.0 (H) 12/30/2017 0919   TRIG 182 (H) 12/22/2012 0848   HDL 52.50 12/30/2017 0919   HDL 54 12/22/2012 0848   CHOLHDL 5 12/30/2017 0919   VLDL 52.2 (H) 12/30/2017 0919   LDLCALC 155 (H) 08/01/2014 0807   LDLCALC 162 (H) 12/22/2012 0848   LDLDIRECT 186.0 12/30/2017 0919    Physical Exam:    VS:  BP (!) 152/76    Pulse 79    Ht 5\' 3"  (1.6 m)    Wt 168 lb (76.2 kg)    SpO2 98%    BMI 29.76 kg/m     Wt Readings from Last 3 Encounters:  04/10/20 168 lb (76.2 kg)  02/21/20 169 lb (76.7 kg)  01/21/20 171 lb (77.6 kg)     GEN: Mildly overweight. No acute distress HEENT: Normal NECK: No JVD. LYMPHATICS: No lymphadenopathy CARDIAC: No murmur. RRR no gallop, or edema. VASCULAR:  Normal Pulses. No bruits. RESPIRATORY:  Clear to auscultation without rales, wheezing or rhonchi  ABDOMEN: Soft, non-tender, non-distended, No pulsatile mass, MUSCULOSKELETAL: No deformity  SKIN: Warm and dry NEUROLOGIC:  Alert and oriented x 3 PSYCHIATRIC:  Normal affect   ASSESSMENT:    1. Paroxysmal SVT (supraventricular tachycardia) ([place])   2. Essential hypertension   3. Mixed hyperlipidemia   4. Educated about COVID-19 virus infection   5. Palpitations    PLAN:    In order of  problems listed above:  1. Metoprolol XL 25 mg/day 2. Target 130/80 mmHg.  Add Toprol-XL 25 mg/day.  Follow-up 1 month with further up titration as needed. 3. The LDL cholesterol was seriously elevated when last checked in 2019. 4. Vaccinated and practicing social distancing. 5. We will do a 2-week continuous monitor to rule out atrial fibrillation/PSVT.  BP follow-up in 1 month.  Further titrate beta-blocker therapy.  If still having palpitations, at least 2  weeks of monitoring will be necessary.  Medication Adjustments/Labs and Tests Ordered: Current medicines are reviewed at length with the patient today.  Concerns regarding medicines are outlined above.  No orders of the defined types were placed in this encounter.  Meds ordered this encounter  Medications   metoprolol succinate (TOPROL XL) 25 MG 24 hr tablet    Sig: Take 1 tablet (25 mg total) by mouth daily.    Dispense:  90 tablet    Refill:  3    Patient Instructions  Medication Instructions:  1) START Metoprolol Succinate 25mg  once daily  *If you need a refill on your cardiac medications before your next appointment, please call your pharmacy*   Lab Work: None If you have labs (blood work) drawn today and your tests are completely normal, you will receive your results only by:  [place] (if you have MyChart) OR  A paper copy in the mail If you have any lab test that is abnormal or we need to change your treatment, we will call you to review the results.   Testing/Procedures: None   Follow-Up: At [facility], you and your health needs are our priority.  As part of our continuing mission to provide you with exceptional heart care, we have created designated Provider Care Teams.  These Care Teams include your primary Cardiologist (physician) and Advanced Practice Providers (APPs -  Physician Assistants and Nurse Practitioners) who all work together to provide you with the care you need, when you need  it.  We recommend signing up for the patient portal called "MyChart".  Sign up information is provided on this After Visit Summary.  MyChart is used to connect with patients for Virtual Visits (Telemedicine).  Patients are able to view lab/test results, encounter notes, upcoming appointments, etc.  Non-urgent messages can be sent to your provider as well.   To learn more about what you can do with MyChart, go to [email or web address].    Your next appointment:   1 month(s)  The format for your next appointment:   In Person  Provider:   You may see Sinclair Grooms, MD or one of the following Advanced Practice Providers on your designated Care Team:    Kathyrn Drown, NP    Other Instructions      Signed, Sinclair Grooms, MD  04/10/2020 9:50 AM    [place]

## 2020-04-10 ENCOUNTER — Encounter: Payer: Self-pay | Admitting: Interventional Cardiology

## 2020-04-10 ENCOUNTER — Ambulatory Visit (INDEPENDENT_AMBULATORY_CARE_PROVIDER_SITE_OTHER): Payer: Medicare Other

## 2020-04-10 ENCOUNTER — Encounter: Payer: Self-pay | Admitting: *Deleted

## 2020-04-10 ENCOUNTER — Other Ambulatory Visit: Payer: Self-pay

## 2020-04-10 ENCOUNTER — Ambulatory Visit: Payer: Medicare Other | Admitting: Interventional Cardiology

## 2020-04-10 VITALS — BP 152/76 | HR 79 | Ht 63.0 in | Wt 168.0 lb

## 2020-04-10 DIAGNOSIS — R002 Palpitations: Secondary | ICD-10-CM

## 2020-04-10 DIAGNOSIS — Z7189 Other specified counseling: Secondary | ICD-10-CM

## 2020-04-10 DIAGNOSIS — I10 Essential (primary) hypertension: Secondary | ICD-10-CM | POA: Diagnosis not present

## 2020-04-10 DIAGNOSIS — I471 Supraventricular tachycardia: Secondary | ICD-10-CM | POA: Diagnosis not present

## 2020-04-10 DIAGNOSIS — E782 Mixed hyperlipidemia: Secondary | ICD-10-CM | POA: Diagnosis not present

## 2020-04-10 MED ORDER — METOPROLOL SUCCINATE ER 25 MG PO TB24: 25.0000 mg | ORAL_TABLET | Freq: Every day | ORAL | 3 refills | Status: DC

## 2020-04-10 NOTE — Patient Instructions (Addendum)
Medication Instructions:  1) START Metoprolol Succinate 25mg  once daily  *If you need a refill on your cardiac medications before your next appointment, please call your pharmacy*   Lab Work: None If you have labs (blood work) drawn today and your tests are completely normal, you will receive your results only by: [place] MyChart Message (if you have MyChart) OR . A paper copy in the mail If you have any lab test that is abnormal or we need to change your treatment, we will call you to review the results.   Testing/Procedures: Your physician recommends that you wear a monitor for 14 days.    Follow-Up: At [facility], you and your health needs are our priority.  As part of our continuing mission to provide you with exceptional heart care, we have created designated Provider Care Teams.  These Care Teams include your primary Cardiologist (physician) and Advanced Practice Providers (APPs -  Physician Assistants and Nurse Practitioners) who all work together to provide you with the care you need, when you need it.  We recommend signing up for the patient portal called "MyChart".  Sign up information is provided on this After Visit Summary.  MyChart is used to connect with patients for Virtual Visits (Telemedicine).  Patients are able to view lab/test results, encounter notes, upcoming appointments, etc.  Non-urgent messages can be sent to your provider as well.   To learn more about what you can do with MyChart, go to [email or web address].    Your next appointment:   4-6 week(s)  The format for your next appointment:   In Person  Provider:   You may see Sinclair Grooms, MD or one of the following Advanced Practice Providers on your designated Care Team:    Kathyrn Drown, NP    Other Instructions  [place] Monitor Instructions   Your physician has requested you wear your ZIO patch monitor 14 days.   This is a single patch monitor.  [employer] supplies one patch monitor  per enrollment.  Additional stickers are not available.   Please do not apply patch if you will be having a Nuclear Stress Test, Echocardiogram, Cardiac CT, MRI, or Chest Xray during the time frame you would be wearing the monitor. The patch cannot be worn during these tests.  You cannot remove and re-apply the ZIO XT patch monitor.   Your ZIO patch monitor will be sent USPS Priority mail from [facility] directly to your home address. The monitor may also be mailed to a PO BOX if home delivery is not available.   It may take 3-5 days to receive your monitor after you have been enrolled.   Once you have received you monitor, please review enclosed instructions.  Your monitor has already been registered assigning a specific monitor serial # to you.   Applying the monitor   Shave hair from upper left chest.   Hold abrader disc by orange tab.  Rub abrader in 40 strokes over left upper chest as indicated in your monitor instructions.   Clean area with 4 enclosed alcohol pads .  Use all pads to assure are is cleaned thoroughly.  Let dry.   Apply patch as indicated in monitor instructions.  Patch will be place under collarbone on left side of chest with arrow pointing upward.   Rub patch adhesive wings for 2 minutes.Remove white label marked "1".  Remove white label marked "2".  Rub patch adhesive wings for 2 additional minutes.  While looking in a mirror, press and release button in center of patch.  A small green light will flash 3-4 times .  This will be your only indicator the monitor has been turned on.     Do not shower for the first 24 hours.  You may shower after the first 24 hours.   Press button if you feel a symptom. You will hear a small click.  Record Date, Time and Symptom in the Patient Log Book.   When you are ready to remove patch, follow instructions on last 2 pages of Patient Log Book.  Stick patch monitor onto last page of Patient Log Book.   Place Patient Log Book  in [place] box.  Use locking tab on box and tape box closed securely.  The [employer] and [employer] has [employer] on it.  Please place in mailbox as soon as possible.  Your physician should have your test results approximately 7 days after the monitor has been mailed back to [facility].   Call [place] at [phone] if you have questions regarding your ZIO XT patch monitor.  Call them immediately if you see an orange light blinking on your monitor.   If your monitor falls off in less than 4 days contact our Monitor department at [phone].  If your monitor becomes loose or falls off after 4 days call [employer] at [phone] for suggestions on securing your monitor.

## 2020-04-10 NOTE — Progress Notes (Signed)
Patient ID: [name], female   DOB: 07/01/1945, 75 y.o.   MRN: [id number] Patient enrolled for [employer] to ship a 14 day ZIO XT long term holter monitor to [place], [place], [place]  [id number].

## 2020-04-14 DIAGNOSIS — R002 Palpitations: Secondary | ICD-10-CM | POA: Diagnosis not present

## 2020-04-14 DIAGNOSIS — I471 Supraventricular tachycardia: Secondary | ICD-10-CM

## 2020-04-15 ENCOUNTER — Encounter: Payer: Medicare Other | Attending: Physical Medicine & Rehabilitation | Admitting: Registered Nurse

## 2020-04-15 ENCOUNTER — Encounter: Payer: Self-pay | Admitting: Registered Nurse

## 2020-04-15 ENCOUNTER — Other Ambulatory Visit: Payer: Self-pay

## 2020-04-15 VITALS — BP 130/73 | HR 72 | Temp 98.4°F | Ht 63.0 in | Wt 170.0 lb

## 2020-04-15 DIAGNOSIS — G894 Chronic pain syndrome: Secondary | ICD-10-CM | POA: Insufficient documentation

## 2020-04-15 DIAGNOSIS — G500 Trigeminal neuralgia: Secondary | ICD-10-CM | POA: Insufficient documentation

## 2020-04-15 DIAGNOSIS — Z79899 Other long term (current) drug therapy: Secondary | ICD-10-CM | POA: Diagnosis present

## 2020-04-15 DIAGNOSIS — Z5181 Encounter for therapeutic drug level monitoring: Secondary | ICD-10-CM | POA: Diagnosis not present

## 2020-04-15 DIAGNOSIS — R42 Dizziness and giddiness: Secondary | ICD-10-CM | POA: Insufficient documentation

## 2020-04-15 DIAGNOSIS — D333 Benign neoplasm of cranial nerves: Secondary | ICD-10-CM | POA: Insufficient documentation

## 2020-04-15 MED ORDER — TAPENTADOL HCL 50 MG PO TABS: 50.0000 mg | ORAL_TABLET | Freq: Two times a day (BID) | ORAL | 0 refills | Status: DC | PRN

## 2020-04-15 MED ORDER — METHADONE HCL 10 MG PO TABS: 10.0000 mg | ORAL_TABLET | Freq: Every day | ORAL | 0 refills | Status: DC

## 2020-04-15 NOTE — Progress Notes (Signed)
Subjective:    Patient ID: [name], female    DOB: 10-24-1945, 75 y.o.   MRN: [id number]  HPI: [name] Wong a 75 y.o. female who returns for follow up appointment for chronic pain and medication refill. She states she has left facial pain with tingling and burning. Also reports at times she has problems with focusing and with balance. Also with history of Schwannoma removal, dizziness with facial nerve damage, her last MRI was 07/10/2016. This was discussed with Dr Naaman Plummer, she will be scheduled for MRI with and WO contrast . This provider placed a call to [name] regarding the above, awaiting on a return call.  She rates her pain 4. Her current exercise regime Wong walking and performing stretching exercises.  [name] [name] 30.00 MME.  Last Oral Swab was Performed on 12/25/2019, it was consistent.   Pain Inventory Average Pain 7 Pain Right Now 4 My pain Wong burning, stabbing and tingling  In the last 24 hours, has pain interfered with the following? General activity 8 Relation with others 9 Enjoyment of life 9 What TIME of day Wong your pain at its worst? daytime and evening Sleep (in general) Good  Pain Wong worse with: some activites and talking Pain improves with: rest, heat/ice and medication Relief from Meds: 7  Family History  Problem Relation Age of Onset  . Hypertension Mother   . Transient ischemic attack Mother   . Coronary artery disease Father        CHF  . Coronary artery disease Sister   . Macular degeneration Sister   . Transient ischemic attack Maternal Grandmother   . Diabetes Neg Hx   . Cancer Neg Hx    Social History   Socioeconomic History  . Marital status: Married    Spouse name: Not on file  . Number of children: 1  . Years of education: Not on file  . Highest education level: Not on file  Occupational History  . Occupation: Retired  Tobacco Use  . Smoking status: Never Smoker  . Smokeless tobacco: Never  Used  Vaping Use  . Vaping Use: Never used  Substance and Sexual Activity  . Alcohol use: No    Alcohol/week: 0.0 standard drinks  . Drug use: No  . Sexual activity: Not on file  Other Topics Concern  . Not on file  Social History Narrative  . Not on file   Social Determinants of Health   Financial Resource Strain: Not on file  Food Insecurity: Not on file  Transportation Needs: Not on file  Physical Activity: Not on file  Stress: Not on file  Social Connections: Not on file   Past Surgical History:  Procedure Laterality Date  . ABDOMINAL HYSTERECTOMY    . [place]   closed hole in heart  . CATARACT EXTRACTION Left 02/02/2016  . LUMBAR DISC SURGERY     Dr Eddie Dibbles  . no colonoscopy     "scared"; SOC reviewed  . 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 ABDOMINAL HYSTERECTOMY    . [place]   closed hole in heart  . CATARACT EXTRACTION Left 02/02/2016  . LUMBAR DISC SURGERY     Dr Eddie Dibbles  . no colonoscopy     "scared"; SOC reviewed  . Skull base tumor surgery  2005  On brain stem; 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   Past Medical History:  Diagnosis Date  . Abnormality of gait   . Brachial neuritis or radiculitis NOS   . Cervicalgia   . Facial nerve disorder   . Hearing loss    Left ear  . 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 Hyperlipidemia   . Hypertension   . Macular degeneration   . Myofascial pain   . Pneumonia 2005  . Thyroid disease    hyprothyroidism  . Trigeminal neuralgia    Temp 98.4 F (36.9 C)   Ht 5\' 3"  (1.6 m)   Wt 170 lb (77.1 kg)   BMI 30.11 kg/m   Opioid Risk Score:   Fall Risk Score:  `1  Depression screen PHQ 2/9  Depression screen [facility] 2/9 12/25/2019 04/24/2018 10/24/2017 06/20/2017 02/23/2017 12/29/2016 02/04/2016  Decreased Interest 0 0 1 0 0 0 0  Down, Depressed, Hopeless 0 0 1 0 0 0 0  PHQ - 2 Score 0 0 2 0 0 0  0  Altered sleeping - - - - - - -  Tired, decreased energy - - - - - - -  Change in appetite - - - - - - -  Feeling bad or failure about yourself  - - - - - - -  Trouble concentrating - - - - - - -  Moving slowly or fidgety/restless - - - - - - -  Suicidal thoughts - - - - - - -  PHQ-9 Score - - - - - - -    Review of Systems  Constitutional: Negative.   HENT: Negative.   Eyes: Negative.   Respiratory: Negative.   Cardiovascular: Negative.   Gastrointestinal: Negative.   Endocrine: Negative.   Genitourinary: Negative.   Musculoskeletal: Positive for arthralgias.  Allergic/Immunologic: Negative.   Neurological: Negative.   Hematological: Negative.   Psychiatric/Behavioral: Negative.   All other systems reviewed and are negative.      Objective:   Physical Exam Vitals and nursing note reviewed.  Constitutional:      Appearance: Normal appearance.  Cardiovascular:     Rate and Rhythm: Normal rate and regular rhythm.     Pulses: Normal pulses.     Heart sounds: Normal heart sounds.  Pulmonary:     Effort: Pulmonary effort Wong normal.     Breath sounds: Normal breath sounds.  Musculoskeletal:     Cervical back: Normal range of motion and neck supple.     Comments: Normal Muscle Bulk and Muscle Testing Reveals:  Upper Extremities: Full ROM and Muscle Strength 5/5 Lower Extremities: Full ROM and Muscle Strength 5/5 Arises from Table with Ease  Narrow Based Gait    Skin:    General: Skin Wong warm and dry.  Neurological:     Mental Status: She Wong alert and oriented to person, place, and time.  Psychiatric:        Mood and Affect: Mood normal.        Behavior: Behavior normal.           Assessment & Plan:  1. History of vestibular schwannoma resulting in trigeminal neuralgia and left facial nerve injury./ Dizziness: Schedule for MRI w/WO Contrast. This was discussed with Dr Naaman Plummer, he agrees with plan. 04/15/2020. Refilled: Methadone 10 mgdaily #30, second script  sent for the following monthand Nucunta 50 mg one tablet every 12 hours as needed #30. We will continue the opioid  monitoring program, this consists of regular clinic visits, examinations, urine drug screen, pill counts as well as use of [place] Controlled Substance Reporting system. A 12 month History has been reviewed on the [place] Controlled Substance Reporting Systemon 04/15/2020. 2. History of cervicalgia/ Cervical Radiculitis:Continue current medication regimen..Continue HEP and Continue to Monitor.04/15/2020 3. 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 Continue to monitor.04/15/2020  F/U in 2 months

## 2020-04-16 ENCOUNTER — Telehealth: Payer: Self-pay | Admitting: Registered Nurse

## 2020-04-16 NOTE — Telephone Encounter (Signed)
Spoke with Dr Albertina Senegal z regarding [name] request for MRI, due to her symptoms. He agrees with MRI W and WO Contrast. Placed a call to [name] regarding the above, no answer. Left message to give office a call.

## 2020-05-19 NOTE — Progress Notes (Signed)
Cardiology Office Note:    Date:  05/21/2020   ID:  [name], DOB October 05, 1945, MRN [id number]  PCP:  Traci Alar, NP  Cardiologist:  Traci Grooms, MD   Referring MD: Traci Alar, NP   Chief Complaint  Patient presents with  . Supraventricular tachycardia  . Follow-up    Palpitations/PSVT     History of Present Illness:    [name] is a 75 y.o. 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  She has been followed by Truitt Merle, NP.  I saw her a month ago and ordered a 14-day monitor which revealed frequent nonsustained SVT.  Longest episode was 45 beats.  She had rare PVCs.  Tried metoprolol succinate 25 mg/day but she has side effects and stopped taking the medicine.  Despite being off the medication she has not been bothered again by racing or palpitations.  She has an extremely elevated total cholesterol.  I cannot see laboratory data from her primary physician.  The most recent cholesterol from 2019 revealed a total of 260.  On no therapy and desires not to be on therapy unless absolutely necessary.  Her brother died on the surgery table attempting to undergo coronary bypass surgery.  Past Medical History:  Diagnosis Date  . Abnormality of gait   . Brachial neuritis or radiculitis NOS   . Cervicalgia   . Facial nerve disorder   . Hearing loss    Left ear  . 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 Hyperlipidemia   . Hypertension   . Macular degeneration   . Myofascial pain   . Pneumonia 2005  . Thyroid disease    hyprothyroidism  . Trigeminal neuralgia     Past Surgical History:  Procedure Laterality Date  . ABDOMINAL HYSTERECTOMY    . [place]   closed hole in heart  . CATARACT EXTRACTION Left 02/02/2016  . LUMBAR DISC SURGERY      Dr Eddie Dibbles  . no colonoscopy     "scared"; SOC reviewed  . 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; 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    Current Medications: Current Meds  Medication Sig  . clorazepate (TRANXENE) 7.5 MG tablet Take 7.5 mg by mouth 2 (two) times daily as needed for anxiety.  . diclofenac sodium (VOLTAREN) 1 % GEL Apply 1 application topically 3 (three) times daily. To left face  . estradiol (ESTRACE) 0.5 MG tablet Take 1 tablet by mouth daily. q  . furosemide (LASIX) 40 MG tablet Take 1 tablet (40 mg total) by mouth daily as needed for fluid or edema.  . hydrochlorothiazide (HYDRODIURIL) 25 MG tablet Take 1 tablet (25 mg total) by mouth daily.  . irbesartan (AVAPRO) 300 MG tablet Take 1 tablet (300 mg total) by mouth daily.  Marland Kitchen levothyroxine (SYNTHROID) 100 MCG tablet Take 100 mcg by mouth every morning.  . methadone (DOLOPHINE) 10 MG tablet Take 1 tablet (10 mg total) by mouth daily.  . Multiple Vitamins-Minerals (PRESERVISION AREDS PO) Take 1 capsule by mouth daily.   . tapentadol (NUCYNTA) 50 MG tablet Take 1 tablet (50 mg total) by mouth every 12 (twelve) hours as needed.     Allergies:   Morphine   Social History   Socioeconomic History  . Marital  status: Married    Spouse name: Not on file  . Number of children: 1  . Years of education: Not on file  . Highest education level: Not on file  Occupational History  . Occupation: Retired  Tobacco Use  . Smoking status: Never Smoker  . Smokeless tobacco: Never Used  Vaping Use  . Vaping Use: Never used  Substance and Sexual Activity  . Alcohol use: No    Alcohol/week: 0.0 standard drinks  . Drug use: No  . Sexual activity: Not on file  Other Topics Concern  . Not on file  Social History Narrative  . Not on file   Social Determinants of Health   Financial Resource Strain: Not on file  Food Insecurity: Not on file  Transportation Needs: Not on file  Physical Activity: Not on file  Stress: Not on file   Social Connections: Not on file     Family History: The patient's family history includes Coronary artery disease in her father and sister; Hypertension in her mother; Macular degeneration in her sister; Transient ischemic attack in her maternal grandmother and mother. There is no history of Diabetes or Cancer.  ROS:   Please see the history of present illness.    Tingling and numbness in her feet at night.  She sleeps in a recliner because she gets vertigo when she lays flat.  This has been the case since having left brain surgery in 2005.  She has not had any tachypalpitations.  All other systems reviewed and are negative.  EKGs/Labs/Other Studies Reviewed:    The following studies were reviewed today:  14-day monitor May 15, 2020: Study Highlights    Basic rhythm is NSR with HR range 53 to 150 bpm. Ave HR 74 bpm.  Non sustained SVT occurred 24 times with longest 42 beats at 105 bpm c/w ectopic atrial tachycardia  No atrial fibrillation  Rare PVC's   EKG:  EKG performed December 2021 demonstrates sinus rhythm with diffuse nonspecific ST abnormality.  Recent Labs: 01/21/2020: BUN 15; Creatinine, Ser 0.78; Potassium 4.7; Sodium 143  Recent Lipid Panel    Component Value Date/Time   CHOL 260 (H) 12/30/2017 0919   CHOL 252 (H) 12/22/2012 0848   TRIG 261.0 (H) 12/30/2017 0919   TRIG 182 (H) 12/22/2012 0848   HDL 52.50 12/30/2017 0919   HDL 54 12/22/2012 0848   CHOLHDL 5 12/30/2017 0919   VLDL 52.2 (H) 12/30/2017 0919   LDLCALC 155 (H) 08/01/2014 0807   LDLCALC 162 (H) 12/22/2012 0848   LDLDIRECT 186.0 12/30/2017 0919    Physical Exam:    VS:  BP 124/76   Pulse 72   Ht 5\' 3"  (1.6 m)   Wt 170 lb (77.1 kg)   BMI 30.11 kg/m     Wt Readings from Last 3 Encounters:  05/21/20 170 lb (77.1 kg)  04/15/20 170 lb (77.1 kg)  04/10/20 168 lb (76.2 kg)     GEN: Overweight. No acute distress HEENT: Normal NECK: No JVD. LYMPHATICS: No lymphadenopathy CARDIAC: No  murmur. RRR no gallop, or edema. VASCULAR:  Normal Pulses. No bruits. RESPIRATORY:  Clear to auscultation without rales, wheezing or rhonchi  ABDOMEN: Soft, non-tender, non-distended, No pulsatile mass, MUSCULOSKELETAL: No deformity  SKIN: Warm and dry NEUROLOGIC:  Alert and oriented x 3 PSYCHIATRIC:  Normal affect   ASSESSMENT:    1. Palpitations   2. Paroxysmal SVT (supraventricular tachycardia) ([place])   3. Essential hypertension   4. Mixed hyperlipidemia  PLAN:    In order of problems listed above:  1. Ectopic atrial tachycardia.  Prescribed Toprol XL 25 mg/day but she could not tolerate it.  Currently not on any medication.  Doing okay without palpitations.  If she has recurrence, consider calcium channel blocker therapy (nondihydropyridine). 2. No evidence of PSVT noted on the monitor.  She has had prior successful ablation.  The monitor also did not demonstrate atrial fib. 3. Blood pressure is adequately controlled on Avapro 300 mg/day and 20 mg of furosemide per day. 4. Extremely elevated lipids with family history of CAD.  I believe she has subclinical atherosclerosis.  She is against therapy.  Coronary calcium score be done to help assess risk and help frame conversation concerning prevention.  Overall education and awareness concerning secondary risk prevention was discussed in detail: LDL less than 70, hemoglobin A1c less than 7, blood pressure target less than 130/80 mmHg, >150 minutes of moderate aerobic activity per week, avoidance of smoking, weight control (via diet and exercise), and continued surveillance/management of/for obstructive sleep apnea.    Medication Adjustments/Labs and Tests Ordered: Current medicines are reviewed at length with the patient today.  Concerns regarding medicines are outlined above.  Orders Placed This Encounter  Procedures  . CT CARDIAC SCORING (SELF PAY ONLY)   No orders of the defined types were placed in this encounter.   Patient  Instructions  Medication Instructions:  Your physician recommends that you continue on your current medications as directed. Please refer to the Current Medication list given to you today.  *If you need a refill on your cardiac medications before your next appointment, please call your pharmacy*   Lab Work: None If you have labs (blood work) drawn today and your tests are completely normal, you will receive your results only by: Marland Kitchen MyChart Message (if you have MyChart) OR . A paper copy in the mail If you have any lab test that is abnormal or we need to change your treatment, we will call you to review the results.   Testing/Procedures: Your physician recommends that you have a Calcium Score performed.    Follow-Up: At [facility], you and your health needs are our priority.  As part of our continuing mission to provide you with exceptional heart care, we have created designated Provider Care Teams.  These Care Teams include your primary Cardiologist (physician) and Advanced Practice Providers (APPs -  Physician Assistants and Nurse Practitioners) who all work together to provide you with the care you need, when you need it.  We recommend signing up for the patient portal called "MyChart".  Sign up information is provided on this After Visit Summary.  MyChart is used to connect with patients for Virtual Visits (Telemedicine).  Patients are able to view lab/test results, encounter notes, upcoming appointments, etc.  Non-urgent messages can be sent to your provider as well.   To learn more about what you can do with MyChart, go to [email or web address].    Your next appointment:   9-12 month(s)  The format for your next appointment:   In Person  Provider:   You may see Traci Grooms, MD or one of the following Advanced Practice Providers on your designated Care Team:    Kathyrn Drown, NP    Other Instructions      Signed, Traci Grooms, MD  05/21/2020 11:02 AM     [place]

## 2020-05-21 ENCOUNTER — Encounter: Payer: Self-pay | Admitting: Interventional Cardiology

## 2020-05-21 ENCOUNTER — Other Ambulatory Visit: Payer: Self-pay

## 2020-05-21 ENCOUNTER — Ambulatory Visit: Payer: Medicare Other | Admitting: Interventional Cardiology

## 2020-05-21 VITALS — BP 124/76 | HR 72 | Ht 63.0 in | Wt 170.0 lb

## 2020-05-21 DIAGNOSIS — E782 Mixed hyperlipidemia: Secondary | ICD-10-CM

## 2020-05-21 DIAGNOSIS — I10 Essential (primary) hypertension: Secondary | ICD-10-CM

## 2020-05-21 DIAGNOSIS — I471 Supraventricular tachycardia, unspecified: Secondary | ICD-10-CM

## 2020-05-21 DIAGNOSIS — R002 Palpitations: Secondary | ICD-10-CM

## 2020-05-21 NOTE — Patient Instructions (Addendum)
Medication Instructions:  Your physician recommends that you continue on your current medications as directed. Please refer to the Current Medication list given to you today.  *If you need a refill on your cardiac medications before your next appointment, please call your pharmacy*   Lab Work: None If you have labs (blood work) drawn today and your tests are completely normal, you will receive your results only by: Marland Kitchen MyChart Message (if you have MyChart) OR . A paper copy in the mail If you have any lab test that is abnormal or we need to change your treatment, we will call you to review the results.   Testing/Procedures: Your physician recommends that you have a Calcium Score performed.    Follow-Up: At [facility], you and your health needs are our priority.  As part of our continuing mission to provide you with exceptional heart care, we have created designated Provider Care Teams.  These Care Teams include your primary Cardiologist (physician) and Advanced Practice Providers (APPs -  Physician Assistants and Nurse Practitioners) who all work together to provide you with the care you need, when you need it.  We recommend signing up for the patient portal called "MyChart".  Sign up information is provided on this After Visit Summary.  MyChart is used to connect with patients for Virtual Visits (Telemedicine).  Patients are able to view lab/test results, encounter notes, upcoming appointments, etc.  Non-urgent messages can be sent to your provider as well.   To learn more about what you can do with MyChart, go to [email or web address].    Your next appointment:   9-12 month(s)  The format for your next appointment:   In Person  Provider:   You may see Sinclair Grooms, MD or one of the following Advanced Practice Providers on your designated Care Team:    Kathyrn Drown, NP    Other Instructions

## 2020-05-25 DIAGNOSIS — G894 Chronic pain syndrome: Secondary | ICD-10-CM

## 2020-05-25 DIAGNOSIS — G500 Trigeminal neuralgia: Secondary | ICD-10-CM

## 2020-05-26 ENCOUNTER — Other Ambulatory Visit (HOSPITAL_COMMUNITY): Payer: Self-pay | Admitting: Physical Medicine & Rehabilitation

## 2020-05-26 MED ORDER — METHADONE HCL 10 MG PO TABS: 10.0000 mg | ORAL_TABLET | Freq: Every day | ORAL | 0 refills | Status: DC

## 2020-05-26 NOTE — Telephone Encounter (Signed)
Requested Prescriptions   Signed Prescriptions Disp Refills  . methadone (DOLOPHINE) 10 MG tablet 30 tablet 0    Sig: Take 1 tablet (10 mg total) by mouth daily.    Authorizing Provider: Meredith Staggers

## 2020-06-16 NOTE — Progress Notes (Signed)
Subjective:    Patient ID: [name], female    DOB: 1945/05/02, 75 y.o.   MRN: [id number]  HPI: [name] is a 75 y.o. female who returns for follow up appointment for chronic pain and medication refill. She states her pain is located in her right shoulder, lower back pain radiating into her bilateral lower extremities L>R. Also reports left facial pain. She rates her pain 5. Her current exercise regime is walking and performing stretching exercises.  Ms. Speedy Morphine equivalent is 30.00  MME.  Oral Swab was Performed today.    Pain Inventory Average Pain 7 Pain Right Now 5 My pain is burning, stabbing and tingling  In the last 24 hours, has pain interfered with the following? General activity 8 Relation with others 9 Enjoyment of life 9 What TIME of day is your pain at its worst? daytime Sleep (in general) Good  Pain is worse with: some activites and talking Pain improves with: rest, heat/ice and medication Relief from Meds: 8  Family History  Problem Relation Age of Onset  . Hypertension Mother   . Transient ischemic attack Mother   . Coronary artery disease Father        CHF  . Coronary artery disease Sister   . Macular degeneration Sister   . Transient ischemic attack Maternal Grandmother   . Diabetes Neg Hx   . Cancer Neg Hx    Social History   Socioeconomic History  . Marital status: Married    Spouse name: Not on file  . Number of children: 1  . Years of education: Not on file  . Highest education level: Not on file  Occupational History  . Occupation: Retired  Tobacco Use  . Smoking status: Never Smoker  . Smokeless tobacco: Never Used  Vaping Use  . Vaping Use: Never used  Substance and Sexual Activity  . Alcohol use: No    Alcohol/week: 0.0 standard drinks  . Drug use: No  . Sexual activity: Not on file  Other Topics Concern  . Not on file  Social History Narrative  . Not on file   Social Determinants of Health    Financial Resource Strain: Not on file  Food Insecurity: Not on file  Transportation Needs: Not on file  Physical Activity: Not on file  Stress: Not on file  Social Connections: Not on file   Past Surgical History:  Procedure Laterality Date  . ABDOMINAL HYSTERECTOMY    . [place]   closed hole in heart  . CATARACT EXTRACTION Left 02/02/2016  . LUMBAR DISC SURGERY     Dr Eddie Dibbles  . no colonoscopy     "scared"; SOC reviewed  . 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 ABDOMINAL HYSTERECTOMY    . [place]   closed hole in heart  . CATARACT EXTRACTION Left 02/02/2016  . LUMBAR DISC SURGERY     Dr Eddie Dibbles  . no colonoscopy     "scared"; SOC reviewed  . 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 Abnormality of gait   . Brachial neuritis or radiculitis NOS   . Cervicalgia   . Facial nerve disorder   . Hearing loss    Left ear  . 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 Hyperlipidemia   . Hypertension   . Macular degeneration   . Myofascial pain   . Pneumonia 2005  . Thyroid disease    hyprothyroidism  . Trigeminal neuralgia    BP (!) 148/74   Pulse 69   Temp 98.7 F (37.1 C)   Ht 5\' 3"  (1.6 m)   Wt 170 lb 9.6 oz (77.4 kg)   SpO2 96%   BMI 30.22 kg/m   Opioid Risk Score:   Fall Risk Score:  `1  Depression screen PHQ 2/9  Depression screen [facility] 2/9 12/25/2019 04/24/2018 10/24/2017 06/20/2017 02/23/2017 12/29/2016 02/04/2016  Decreased Interest 0 0 1 0 0 0 0  Down, Depressed, Hopeless 0 0 1 0 0 0 0  PHQ - 2 Score 0 0 2 0 0 0 0  Altered sleeping - - - - - - -  Tired, decreased energy - - - - - - -  Change in appetite - - - - - - -  Feeling bad or failure about yourself  - - - - - - -  Trouble concentrating - - - - - - -  Moving slowly or fidgety/restless - - - - - - -   Suicidal thoughts - - - - - - -  PHQ-9 Score - - - - - - -     Review of Systems  Musculoskeletal: Positive for arthralgias.  All other systems reviewed and are negative.      Objective:   Physical Exam Vitals and nursing note reviewed.  Constitutional:      Appearance: Normal appearance.  Cardiovascular:     Rate and Rhythm: Normal rate and regular rhythm.     Pulses: Normal pulses.     Heart sounds: Normal heart sounds.  Pulmonary:     Effort: Pulmonary effort is normal.     Breath sounds: Normal breath sounds.  Musculoskeletal:     Cervical back: Normal range of motion and neck supple.     Comments: Normal Muscle Bulk and Muscle Testing Reveals: Upper Extremities: Full ROM and Muscle Strength 5/5  Lumbar Paraspinal Tenderness: L-3-L-5 Left Greater Trochanter Tenderness Lower Extremities: Full ROM and Muscle Strength 5/5 Arises from Table with ease Narrow Based  Gait   Skin:    General: Skin is warm and dry.  Neurological:     Mental Status: She is alert and oriented to person, place, and time.  Psychiatric:        Mood and Affect: Mood normal.        Behavior: Behavior normal.           Assessment & Plan:  1. History of vestibular schwannoma resulting in trigeminal neuralgia and left facial nerve injury./ Dizziness: Schedule for MRI w/WO Contrast. This was discussed with Dr Naaman Plummer, he agrees with plan. 06/17/2020. Refilled: Methadone 10 mgdaily #30, second script sent for the following monthand Nucunta 50 mg one tablet every 12 hours as needed #30. We will continue the opioid monitoring program, this consists of regular clinic visits, examinations, urine drug screen, pill counts as well as use of [place] Controlled Substance Reporting system. A 12 month History has been reviewed on the [place] Controlled Substance Reporting Systemon05/04/2020. 2. History of cervicalgia/ Cervical Radiculitis:Continue current medication regimen..Continue HEP  and Continue to Monitor.06/17/2020 3. 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 Continue to monitor.06/17/2020 4. Lumbar Radiculitis: Continue current medication Regimen.  Continue Nucynta 5. Greater Trochanter Bursitis of Left Hip: Alternate with Ice and Heat Therapy. Continue to Monitor.   F/U in 2 months

## 2020-06-17 ENCOUNTER — Other Ambulatory Visit: Payer: Self-pay

## 2020-06-17 ENCOUNTER — Encounter: Payer: Medicare Other | Attending: Physical Medicine & Rehabilitation | Admitting: Registered Nurse

## 2020-06-17 ENCOUNTER — Ambulatory Visit: Payer: Medicare Other | Admitting: Registered Nurse

## 2020-06-17 VITALS — BP 148/74 | HR 69 | Temp 98.7°F | Ht 63.0 in | Wt 170.6 lb

## 2020-06-17 DIAGNOSIS — G894 Chronic pain syndrome: Secondary | ICD-10-CM | POA: Diagnosis not present

## 2020-06-17 DIAGNOSIS — M778 Other enthesopathies, not elsewhere classified: Secondary | ICD-10-CM | POA: Diagnosis present

## 2020-06-17 DIAGNOSIS — M7062 Trochanteric bursitis, left hip: Secondary | ICD-10-CM | POA: Diagnosis present

## 2020-06-17 DIAGNOSIS — M5416 Radiculopathy, lumbar region: Secondary | ICD-10-CM | POA: Insufficient documentation

## 2020-06-17 DIAGNOSIS — G500 Trigeminal neuralgia: Secondary | ICD-10-CM | POA: Insufficient documentation

## 2020-06-17 DIAGNOSIS — Z5181 Encounter for therapeutic drug level monitoring: Secondary | ICD-10-CM | POA: Diagnosis present

## 2020-06-17 DIAGNOSIS — D333 Benign neoplasm of cranial nerves: Secondary | ICD-10-CM | POA: Diagnosis present

## 2020-06-17 DIAGNOSIS — Z79891 Long term (current) use of opiate analgesic: Secondary | ICD-10-CM | POA: Diagnosis not present

## 2020-06-17 MED ORDER — TAPENTADOL HCL 50 MG PO TABS: 50.0000 mg | ORAL_TABLET | Freq: Two times a day (BID) | ORAL | 0 refills | Status: DC | PRN

## 2020-06-17 MED ORDER — METHADONE HCL 10 MG PO TABS: 10.0000 mg | ORAL_TABLET | Freq: Every day | ORAL | 0 refills | Status: DC

## 2020-06-20 ENCOUNTER — Encounter: Payer: Self-pay | Admitting: Registered Nurse

## 2020-06-20 LAB — DRUG TOX MONITOR 1 W/CONF, ORAL FLD
Amphetamines: NEGATIVE ng/mL (ref <10)
Barbiturates: NEGATIVE ng/mL (ref <10)
Benzodiazepines: NEGATIVE ng/mL (ref <0.50)
Buprenorphine: NEGATIVE ng/mL (ref <0.10)
Cocaine: NEGATIVE ng/mL (ref <5.0)
EDDP: NEGATIVE ng/mL (ref <5.0)
Fentanyl: NEGATIVE ng/mL (ref <0.10)
Heroin Metabolite: NEGATIVE ng/mL (ref <1.0)
MARIJUANA: NEGATIVE ng/mL (ref <2.5)
MDMA: NEGATIVE ng/mL (ref <10)
Meprobamate: NEGATIVE ng/mL (ref <2.5)
Methadone: 20.9 ng/mL — ABNORMAL HIGH (ref <5.0)
Methadone: POSITIVE ng/mL — AB (ref <5.0)
Nicotine Metabolite: NEGATIVE ng/mL (ref <5.0)
Opiates: NEGATIVE ng/mL (ref <2.5)
Phencyclidine: NEGATIVE ng/mL (ref <10)
Tapentadol: NEGATIVE ng/mL (ref <5.0)
Tramadol: NEGATIVE ng/mL (ref <5.0)
Zolpidem: NEGATIVE ng/mL (ref <5.0)

## 2020-06-20 LAB — DRUG TOX ALC METAB W/CON, ORAL FLD: Alcohol Metabolite: NEGATIVE ng/mL (ref <25)

## 2020-06-24 ENCOUNTER — Telehealth: Payer: Self-pay | Admitting: *Deleted

## 2020-06-24 NOTE — Telephone Encounter (Signed)
Oral swab drug screen was consistent for prescribed medications. Her methadone is present, it had been a couple of days since she took the [place].

## 2020-06-26 ENCOUNTER — Other Ambulatory Visit: Payer: Self-pay

## 2020-06-26 ENCOUNTER — Ambulatory Visit (INDEPENDENT_AMBULATORY_CARE_PROVIDER_SITE_OTHER)
Admission: RE | Admit: 2020-06-26 | Discharge: 2020-06-26 | Disposition: A | Discharge status: Home / Self Care | Payer: Self-pay | Source: Ambulatory Visit | Attending: Interventional Cardiology | Admitting: Interventional Cardiology

## 2020-06-26 DIAGNOSIS — I10 Essential (primary) hypertension: Secondary | ICD-10-CM

## 2020-06-26 DIAGNOSIS — E782 Mixed hyperlipidemia: Secondary | ICD-10-CM

## 2020-06-26 DIAGNOSIS — I471 Supraventricular tachycardia: Secondary | ICD-10-CM

## 2020-08-13 ENCOUNTER — Encounter: Payer: Medicare Other | Attending: Physical Medicine & Rehabilitation | Admitting: Physical Medicine & Rehabilitation

## 2020-08-13 ENCOUNTER — Other Ambulatory Visit: Payer: Self-pay

## 2020-08-13 ENCOUNTER — Encounter: Payer: Self-pay | Admitting: Physical Medicine & Rehabilitation

## 2020-08-13 VITALS — BP 161/73 | HR 69 | Temp 98.6°F | Ht 63.0 in | Wt 168.0 lb

## 2020-08-13 DIAGNOSIS — S0452XS Injury of facial nerve, left side, sequela: Secondary | ICD-10-CM

## 2020-08-13 DIAGNOSIS — G500 Trigeminal neuralgia: Secondary | ICD-10-CM | POA: Diagnosis present

## 2020-08-13 DIAGNOSIS — G894 Chronic pain syndrome: Secondary | ICD-10-CM | POA: Diagnosis present

## 2020-08-13 DIAGNOSIS — D333 Benign neoplasm of cranial nerves: Secondary | ICD-10-CM | POA: Diagnosis not present

## 2020-08-13 DIAGNOSIS — M47816 Spondylosis without myelopathy or radiculopathy, lumbar region: Secondary | ICD-10-CM | POA: Diagnosis present

## 2020-08-13 DIAGNOSIS — M7062 Trochanteric bursitis, left hip: Secondary | ICD-10-CM | POA: Insufficient documentation

## 2020-08-13 MED ORDER — METHADONE HCL 10 MG PO TABS: 10.0000 mg | ORAL_TABLET | Freq: Every day | ORAL | 0 refills | Status: DC

## 2020-08-13 MED ORDER — TAPENTADOL HCL 50 MG PO TABS: 50.0000 mg | ORAL_TABLET | Freq: Two times a day (BID) | ORAL | 0 refills | Status: DC | PRN

## 2020-08-13 NOTE — Progress Notes (Signed)
Subjective:    Patient ID: [name], female    DOB: Jan 20, 1946, 75 y.o.   MRN: [id number]  HPI  [name] is here in follow up of her chronic pain syndrome. She has been having increased low back pain over the last few months. Symptoms were radiating down her left leg in particular. She had a lot of left hip pain for which she was referred for a left greater troch injection.  She feels that over the last few weeks symptoms have resolved a bit.  She is moving better.  She still has some pain down the left buttock into the posterior thigh and knee.  She tried some over-the-counter remedies for "arthritis" which seem to have helped.  She has seen [facility] in the past for her back and had remote back surgery by Dr. Eddie Dibbles.  Is been at least 3 years since she had x-rays of her low back.  From a standpoint of her facial pain, she has been maintaining with the methadone and Nucynta.  She uses the Nucynta especially at nighttime to help with sleep and her increased pain levels at that time.   Pain Inventory Average Pain 7 Pain Right Now 5 My pain is constant  In the last 24 hours, has pain interfered with the following? General activity 0 Relation with others 0 Enjoyment of life 0 What TIME of day is your pain at its worst? varies Sleep (in general) NA  Pain is worse with: walking, standing, and some activites Pain improves with: medication Relief from Meds:  varries  Family History  Problem Relation Age of Onset   Hypertension Mother    Transient ischemic attack Mother    Coronary artery disease Father        CHF   Coronary artery disease Sister    Macular degeneration Sister    Transient ischemic attack Maternal Grandmother    Diabetes Neg Hx    Cancer Neg Hx    Social History   Socioeconomic History   Marital status: Married    Spouse name: Not on file   Number of children: 1   Years of education: Not on file   Highest education level: Not on file   Occupational History   Occupation: Retired  Tobacco Use   Smoking status: Never   Smokeless tobacco: Never  Vaping Use   Vaping Use: Never used  Substance and Sexual Activity   Alcohol use: No    Alcohol/week: 0.0 standard drinks   Drug use: No   Sexual activity: Not on file  Other Topics Concern   Not on file  Social History Narrative   Not on file   Social Determinants of Health   Financial Resource Strain: Not on file  Food Insecurity: Not on file  Transportation Needs: Not on file  Physical Activity: Not on file  Stress: Not on file  Social Connections: Not on file   Past Surgical History:  Procedure Laterality Date   [place]   closed hole in heart   CATARACT EXTRACTION Left 02/02/2016   LUMBAR DISC SURGERY     Dr Eddie Dibbles   no colonoscopy     "scared"; [facility] reviewed   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; 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   Past Surgical History:  Procedure Laterality Date   [place]   closed hole in heart  CATARACT EXTRACTION Left 02/02/2016   LUMBAR DISC SURGERY     Dr Eddie Dibbles   no colonoscopy     "scared"; [facility] reviewed   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; 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   Past Medical History:  Diagnosis Date   Abnormality of gait    Brachial neuritis or radiculitis NOS    Cervicalgia    Facial nerve disorder    Hearing loss    Left ear   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   Hyperlipidemia    Hypertension    Macular degeneration    Myofascial pain    Pneumonia 2005   Thyroid disease    hyprothyroidism   Trigeminal neuralgia    BP (!) 161/73   Pulse 69   Temp 98.6 F (37 C)   Ht 5\' 3"  (1.6 m)   Wt 168 lb (76.2 kg)   SpO2 97%   BMI 29.76 kg/m   Opioid Risk Score:   Fall Risk Score:  `1  Depression screen PHQ 2/9  Depression screen [facility] 2/9 12/25/2019 04/24/2018 10/24/2017 06/20/2017 02/23/2017 12/29/2016  02/04/2016  Decreased Interest 0 0 1 0 0 0 0  Down, Depressed, Hopeless 0 0 1 0 0 0 0  PHQ - 2 Score 0 0 2 0 0 0 0  Altered sleeping - - - - - - -  Tired, decreased energy - - - - - - -  Change in appetite - - - - - - -  Feeling bad or failure about yourself  - - - - - - -  Trouble concentrating - - - - - - -  Moving slowly or fidgety/restless - - - - - - -  Suicidal thoughts - - - - - - -  PHQ-9 Score - - - - - - -    Review of Systems  Constitutional: Negative.   HENT: Negative.    Eyes: Negative.   Respiratory: Negative.    Cardiovascular: Negative.   Gastrointestinal: Negative.   Endocrine: Negative.   Genitourinary: Negative.   Musculoskeletal:  Positive for back pain.  Skin: Negative.   Allergic/Immunologic: Negative.   Neurological: Negative.   Hematological: Negative.   All other systems reviewed and are negative.     Objective:   Physical Exam  General: No acute distress HEENT: EOMI, oral membranes moist Cards: reg rate  Chest: normal effort Abdomen: Soft, NT, ND Skin: dry, intact Extremities: no edema Psych: pleasant and appropriate  Neuro: left C7, sensory loss to left face.  Strength is 5 out of 5 in both lower extremities with normal sensory exam. Musculoskeletal: Low back with reasonable range of motion.  There was some pain with extension.  Facet maneuvers are equivocal.  She was able to flex and bend her back and touch her toes fairly easily today.  Straight leg raising was negative to equivocal. Assessment & Plan:  ASSESSMENT: 1. History of vestibular schwannoma resulting in trigeminal neuralgia and left facial nerve injury- no tumor regrowth per MRI--has chronic peri-operative scarring and atrophy which accounts for weakness, pain, and balance issues.             -stable neuro presentation. 2. History of cervicalgia and lumbar spine surgery---recent low back strain? 3. Chronic pain related to CN 5 involvement 4. Hypothyroid on supplementation 5.  Myofascial pain left neck/shoulder.  Seems improved today.   6. Reactive depression and anxiety   7.  Low back pain with history of previous lumbar surgery.  Symptoms are fairly mild at this point.       PLAN: 1. Methadone 10 mg daily, #30 refilled.   Refilled today     Nucynta 50 mg 1 p.o. q.12 h. p.r.n. 30.    Refilled today We will continue the controlled substance monitoring program, this consists of regular clinic visits, examinations, routine drug screening, pill counts as well as use of [place] Controlled Substance Reporting System. [place] today.   -Medication was refilled and a second prescription was sent to the patient's pharmacy for next month.    .           2. Discussed posture and proper body mechanics 3.  Hypothyroid 4. continue with voltaren gel, topicals.   5. Eye mgt per ENT 6.  Patient was provided with some low back exercises today.  I also sent her for lumbar imaging to assess.  If symptoms do not improve in x-rays or reasonable will consider outpatient physical therapy.  Fifteen minutes of face to face patient care time were spent during this visit. All questions were encouraged and answered.  Follow up with me in 2 mos .

## 2020-08-13 NOTE — Patient Instructions (Addendum)
Back Exercises The following exercises strengthen the muscles that help to support the trunk and back. They also help to keep the lower back flexible. Doing these exercises can help to prevent back pain or lessen existing pain. If you have back pain or discomfort, try doing these exercises 2-3 times each day or as told by your health care provider. As your pain improves, do them once each day, but increase the number of times that you repeat the steps for each exercise (do more repetitions). To prevent the recurrence of back pain, continue to do these exercises once each day or as told by your health care provider. Do exercises exactly as told by your health care provider and adjust them as directed. It is normal to feel mild stretching, pulling, tightness, or discomfort as you do these exercises, but you should stop right away if youfeel sudden pain or your pain gets worse. Exercises Single knee to chest Repeat these steps 3-5 times for each leg: Lie on your back on a firm bed or the floor with your legs extended. Bring one knee to your chest. Your other leg should stay extended and in contact with the floor. Hold your knee in place by grabbing your knee or thigh with both hands and hold. Pull on your knee until you feel a gentle stretch in your lower back or buttocks. Hold the stretch for 10-30 seconds. Slowly release and straighten your leg. Pelvic tilt Repeat these steps 5-10 times: Lie on your back on a firm bed or the floor with your legs extended. Bend your knees so they are pointing toward the ceiling and your feet are flat on the floor. Tighten your lower abdominal muscles to press your lower back against the floor. This motion will tilt your pelvis so your tailbone points up toward the ceiling instead of pointing to your feet or the floor. With gentle tension and even breathing, hold this position for 5-10 seconds. Cat-cow Repeat these steps until your lower back  becomes more flexible: Get into a hands-and-knees position on a firm surface. Keep your hands under your shoulders, and keep your knees under your hips. You may place padding under your knees for comfort. Let your head hang down toward your chest. Contract your abdominal muscles and point your tailbone toward the floor so your lower back becomes rounded like the back of a cat. Hold this position for 5 seconds. Slowly lift your head, let your abdominal muscles relax and point your tailbone up toward the ceiling so your back forms a sagging arch like the back of a cow. Hold this position for 5 seconds.  Press-ups Repeat these steps 5-10 times: Lie on your abdomen (face-down) on the floor. Place your palms near your head, about shoulder-width apart. Keeping your back as relaxed as possible and keeping your hips on the floor, slowly straighten your arms to raise the top half of your body and lift your shoulders. Do not use your back muscles to raise your upper torso. You may adjust the placement of your hands to make yourself more comfortable. Hold this position for 5 seconds while you keep your back relaxed. Slowly return to lying flat on the floor.  Bridges Repeat these steps 10 times: Lie on your back on a firm surface. Bend your knees so they are pointing toward the ceiling and your feet are flat on the floor. Your arms  should be flat at your sides, next to your body. Tighten your buttocks muscles and lift your buttocks off the floor until your waist is at almost the same height as your knees. You should feel the muscles working in your buttocks and the back of your thighs. If you do not feel these muscles, slide your feet 1-2 inches farther away from your buttocks. Hold this position for 3-5 seconds. Slowly lower your hips to the starting position, and allow your buttocks muscles to relax completely. If this exercise is too easy, try doing it with your arms crossed over yourchest. Abdominal  crunches Repeat these steps 5-10 times: Lie on your back on a firm bed or the floor with your legs extended. Bend your knees so they are pointing toward the ceiling and your feet are flat on the floor. Cross your arms over your chest. Tip your chin slightly toward your chest without bending your neck. Tighten your abdominal muscles and slowly raise your trunk (torso) high enough to lift your shoulder blades a tiny bit off the floor. Avoid raising your torso higher than that because it can put too much stress on your low back and does not help to strengthen your abdominal muscles. Slowly return to your starting position. Back lifts Repeat these steps 5-10 times: Lie on your abdomen (face-down) with your arms at your sides, and rest your forehead on the floor. Tighten the muscles in your legs and your buttocks. Slowly lift your chest off the floor while you keep your hips pressed to the floor. Keep the back of your head in line with the curve in your back. Your eyes should be looking at the floor. Hold this position for 3-5 seconds. Slowly return to your starting position. Contact a health care provider if: Your back pain or discomfort gets much worse when you do an exercise. Your worsening back pain or discomfort does not lessen within 2 hours after you exercise. If you have any of these problems, stop doing these exercises right away. Do not do them again unless your health care provider says that you can. Get help right away if: You develop sudden, severe back pain. If this happens, stop doing the exercises right away. Do not do them again unless your health care provider says that you can. This information is not intended to replace advice given to you by your health care provider. Make sure you discuss any questions you have with your healthcare provider. Document Revised: 06/08/2018 Document Reviewed: 11/03/2017 Elsevier Patient Education  [place].

## 2020-10-15 ENCOUNTER — Encounter (INDEPENDENT_AMBULATORY_CARE_PROVIDER_SITE_OTHER): Payer: Medicare Other | Admitting: Ophthalmology

## 2020-10-22 ENCOUNTER — Other Ambulatory Visit: Payer: Self-pay

## 2020-10-22 ENCOUNTER — Encounter: Payer: Self-pay | Admitting: Physical Medicine & Rehabilitation

## 2020-10-22 ENCOUNTER — Encounter: Payer: Medicare Other | Attending: Physical Medicine & Rehabilitation | Admitting: Physical Medicine & Rehabilitation

## 2020-10-22 VITALS — BP 151/74 | HR 75 | Temp 98.3°F | Ht 63.0 in | Wt 174.0 lb

## 2020-10-22 DIAGNOSIS — G894 Chronic pain syndrome: Secondary | ICD-10-CM | POA: Insufficient documentation

## 2020-10-22 DIAGNOSIS — D333 Benign neoplasm of cranial nerves: Secondary | ICD-10-CM | POA: Insufficient documentation

## 2020-10-22 DIAGNOSIS — Z79891 Long term (current) use of opiate analgesic: Secondary | ICD-10-CM | POA: Insufficient documentation

## 2020-10-22 DIAGNOSIS — Z79899 Other long term (current) drug therapy: Secondary | ICD-10-CM | POA: Insufficient documentation

## 2020-10-22 DIAGNOSIS — G500 Trigeminal neuralgia: Secondary | ICD-10-CM | POA: Insufficient documentation

## 2020-10-22 MED ORDER — METHADONE HCL 10 MG PO TABS: 10.0000 mg | ORAL_TABLET | Freq: Every day | ORAL | 0 refills | Status: DC

## 2020-10-22 MED ORDER — TAPENTADOL HCL 50 MG PO TABS: 50.0000 mg | ORAL_TABLET | Freq: Two times a day (BID) | ORAL | 0 refills | Status: DC | PRN

## 2020-10-22 NOTE — Patient Instructions (Signed)
PLEASE FEEL FREE TO CALL OUR OFFICE WITH ANY PROBLEMS OR QUESTIONS ([phone])      

## 2020-10-22 NOTE — Progress Notes (Signed)
Subjective:    Patient ID: [name], female    DOB: 12-22-1945, 75 y.o.   MRN: [id number]  HPI Nanetta is here in follow-up of her chronic pain syndrome and deficits related to her vestibular schwannoma and related surgery.  For the most part she has been fairly stable. Her pain levels have been fairly consistent. She remains on methadone '10mg'$  daily and nucynta prn.    She does feel that her vision may be worsening a bit in the left eye.  She is followed by ophthalmology.  She does use eyedrops regularly regularly to lubricate her eye.  She quit her job in June and is enjoying having some extra time.  She spends the time with her family.  She also likes to relax!   Pain Inventory Average Pain 6 Pain Right Now 6 My pain is constant, burning, dull, tingling, and aching  In the last 24 hours, has pain interfered with the following? General activity 7 Relation with others 9 Enjoyment of life 9 What TIME of day is your pain at its worst? morning  and evening Sleep (in general) Good  Pain is worse with: some activites Pain improves with: rest, heat/ice, and medication Relief from Meds: 8  Family History  Problem Relation Age of Onset   Hypertension Mother    Transient ischemic attack Mother    Coronary artery disease Father        CHF   Coronary artery disease Sister    Macular degeneration Sister    Transient ischemic attack Maternal Grandmother    Diabetes Neg Hx    Cancer Neg Hx    Social History   Socioeconomic History   Marital status: Married    Spouse name: Not on file   Number of children: 1   Years of education: Not on file   Highest education level: Not on file  Occupational History   Occupation: Retired  Tobacco Use   Smoking status: Never   Smokeless tobacco: Never  Vaping Use   Vaping Use: Never used  Substance and Sexual Activity   Alcohol use: No    Alcohol/week: 0.0 standard drinks   Drug use: No   Sexual activity: Not on file  Other  Topics Concern   Not on file  Social History Narrative   Not on file   Social Determinants of Health   Financial Resource Strain: Not on file  Food Insecurity: Not on file  Transportation Needs: Not on file  Physical Activity: Not on file  Stress: Not on file  Social Connections: Not on file   Past Surgical History:  Procedure Laterality Date   [place]   closed hole in heart   CATARACT EXTRACTION Left 02/02/2016   LUMBAR [place]     Dr Eddie Dibbles   no colonoscopy     "scared"; [facility] reviewed   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; 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   Past Surgical History:  Procedure Laterality Date   [place]   closed hole in heart   CATARACT EXTRACTION Left 02/02/2016   LUMBAR [place]     Dr Eddie Dibbles   no colonoscopy     "scared"; [facility] reviewed   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; 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 2/9 12/25/2019 04/24/2018 10/24/2017 06/20/2017 02/23/2017 12/29/2016 02/04/2016  Decreased Interest 0 0 1 0 0 0 0  Down, Depressed, Hopeless 0 0 1 0 0 0 0  PHQ - 2 Score 0 0 2 0 0 0 0  Altered sleeping - - - - - - -  Tired, decreased energy - - - - - - -  Change in appetite - - - - - - -  Feeling bad or failure about yourself  - - - - - - -  Trouble  concentrating - - - - - - -  Moving slowly or fidgety/restless - - - - - - -  Suicidal thoughts - - - - - - -  PHQ-9 Score - - - - - - -    Review of Systems  Constitutional: Negative.   HENT: Negative.    Eyes: Negative.   Respiratory: Negative.    Cardiovascular: Negative.   Gastrointestinal: Negative.   Endocrine: Negative.   Genitourinary: Negative.   Musculoskeletal: Negative.   Skin: Negative.   Allergic/Immunologic: Negative.   Neurological:  Positive for numbness.       Tingling   Hematological: Negative.   Psychiatric/Behavioral: Negative.    All other systems reviewed and are negative.     Objective:   Physical Exam General: No acute distress HEENT: NCAT, EOMI, oral membranes moist, sclera white Cards: reg rate  Chest: normal effort Abdomen: Soft, NT, ND Skin: dry, intact Extremities: no edema Psych: pleasant and appropriate  Neuro: left C7, sensory loss to left face.  Strength is 5 out of 5 in both lower extremities with normal sensory exam. Musculoskeletal: Low back with reasonable range of motion.  There was some pain with extension.  Facet maneuvers are equivocal.  She was able to flex and bend her back and touch her toes fairly easily today.  SLR neg   Assessment & Plan:  ASSESSMENT: 1. History of vestibular schwannoma 2005 resulting in trigeminal neuralgia and left facial nerve injury- no tumor regrowth per MRI--has chronic peri-operative scarring and atrophy which accounts for weakness, pain, and balance issues.             -neurologically stable. 2. History of cervicalgia and lumbar spine surgery---recent low back strain? 3. Chronic pain related to CN 5 involvement 4. Hypothyroid on supplementation 5. Myofascial pain left neck/shoulder.  Seems improved today.   6. Reactive depression and anxiety   7.  Low back pain with history of previous lumbar surgery.  Symptoms are fairly mild at this point.       PLAN: 1. Methadone 10 mg daily, #30 refilled.    Refilled today     Nucynta 50 mg 1  p.o. q.12 h. p.r.n. 30.    Refilled today x 1 We will continue the controlled substance monitoring program, this consists of regular clinic visits, examinations, routine drug screening, pill counts as well as use of [place] Controlled Substance Reporting System. [place] was [place] today.   Medication was refilled and a second prescription was sent to the patient's pharmacy for next month for methadone    -Patient for UDS today 2. Continue to be aware of appropriate posture and proper body mechanics 3.  Hypothyroid 4. continue with voltaren gel, topicals.   5. Eye mgt per ENT. Perhaps she's having micro scratches from environmental debris?    15 minutes of face to face patient care time were spent during this visit. All questions were encouraged and answered.  Follow up with me in 2 mos .

## 2020-10-24 NOTE — Telephone Encounter (Signed)
This encounter was opened in error. No patient interaction occurred.   

## 2020-10-26 LAB — TOXASSURE SELECT,+ANTIDEPR,UR

## 2020-10-28 ENCOUNTER — Telehealth: Payer: Self-pay | Admitting: *Deleted

## 2020-10-28 NOTE — Telephone Encounter (Signed)
U[place] drug screen for this encounter is consistent for prescribed medication 

## 2020-10-31 IMAGING — CR DG CHEST 2V
2 series · 2 of 2 positions shown · non-contrast
Comparison: Chest x-ray of December 25, 2008

CLINICAL DATA: Two month history of bilateral lower extremity
edema. The patient also reports dyspnea

EXAM:
CHEST - 2 VIEW

[w chest pa]
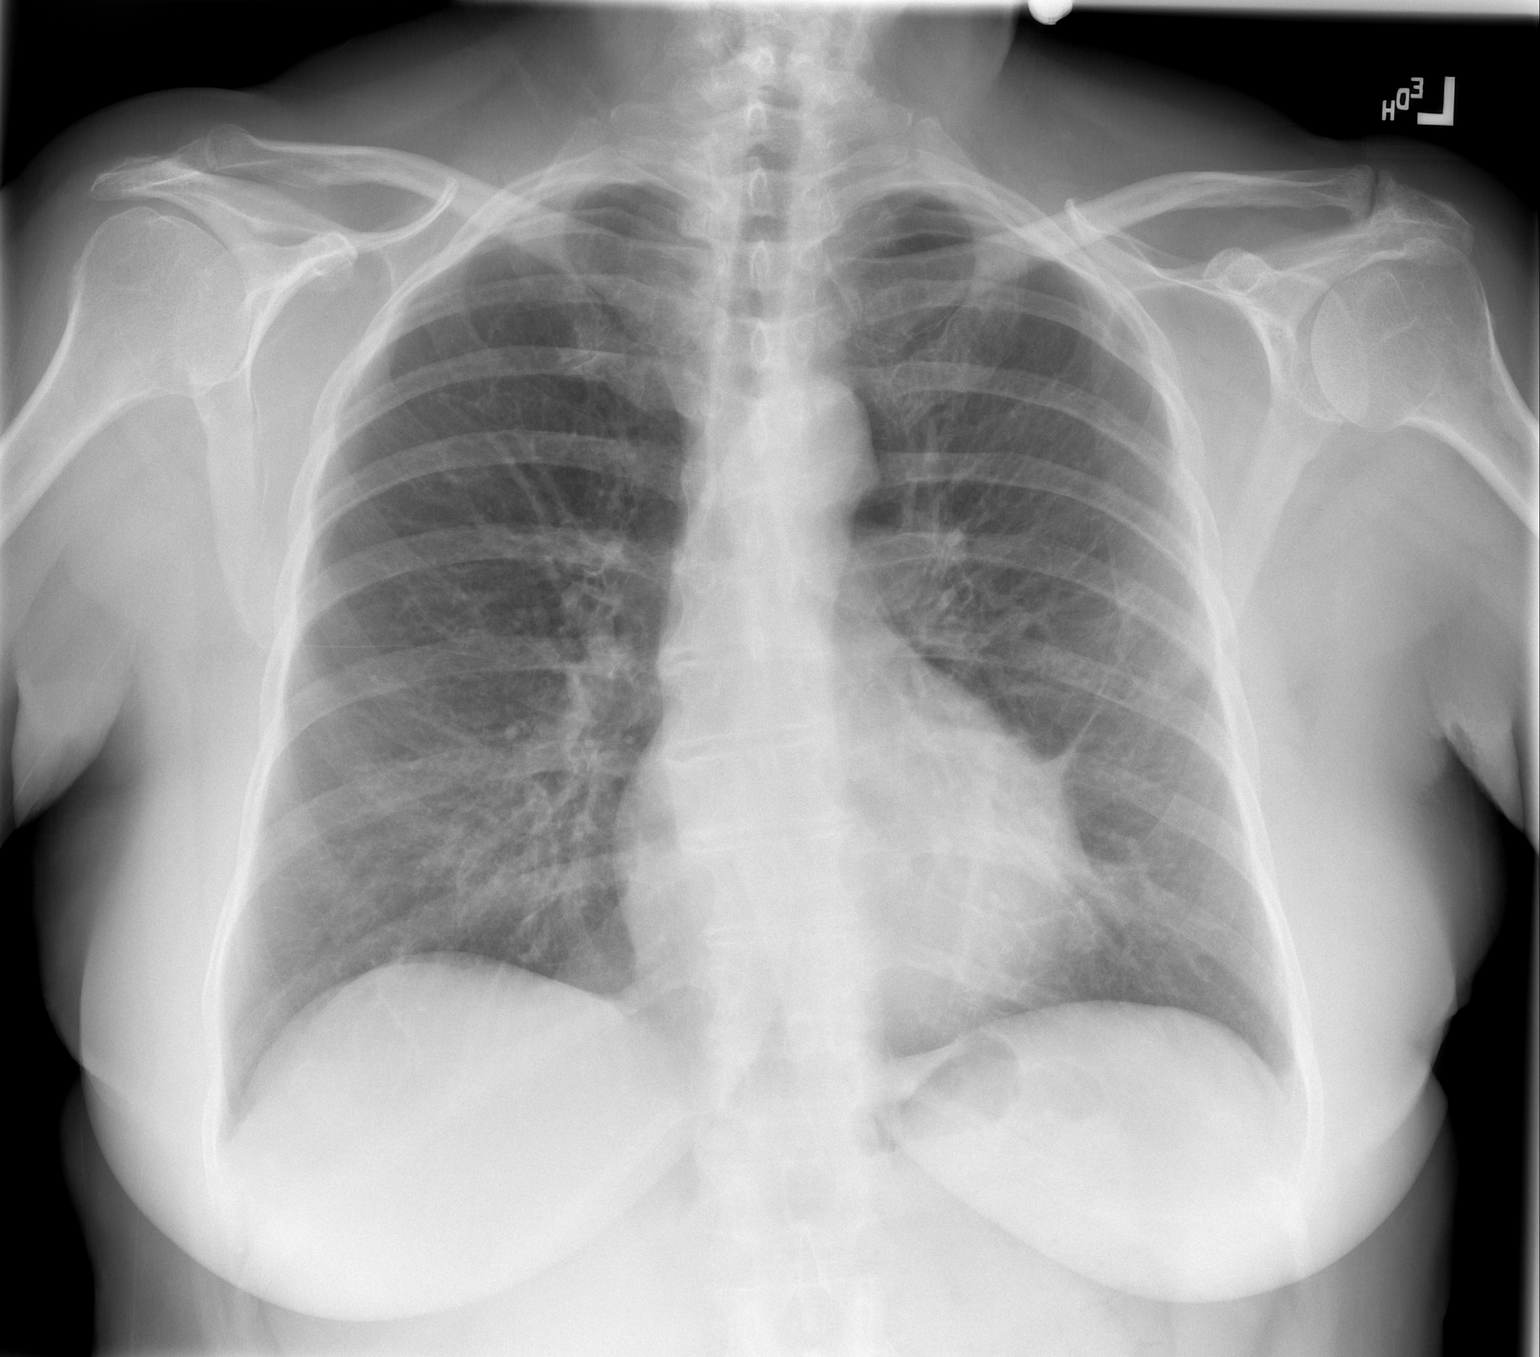

[w chest lat]
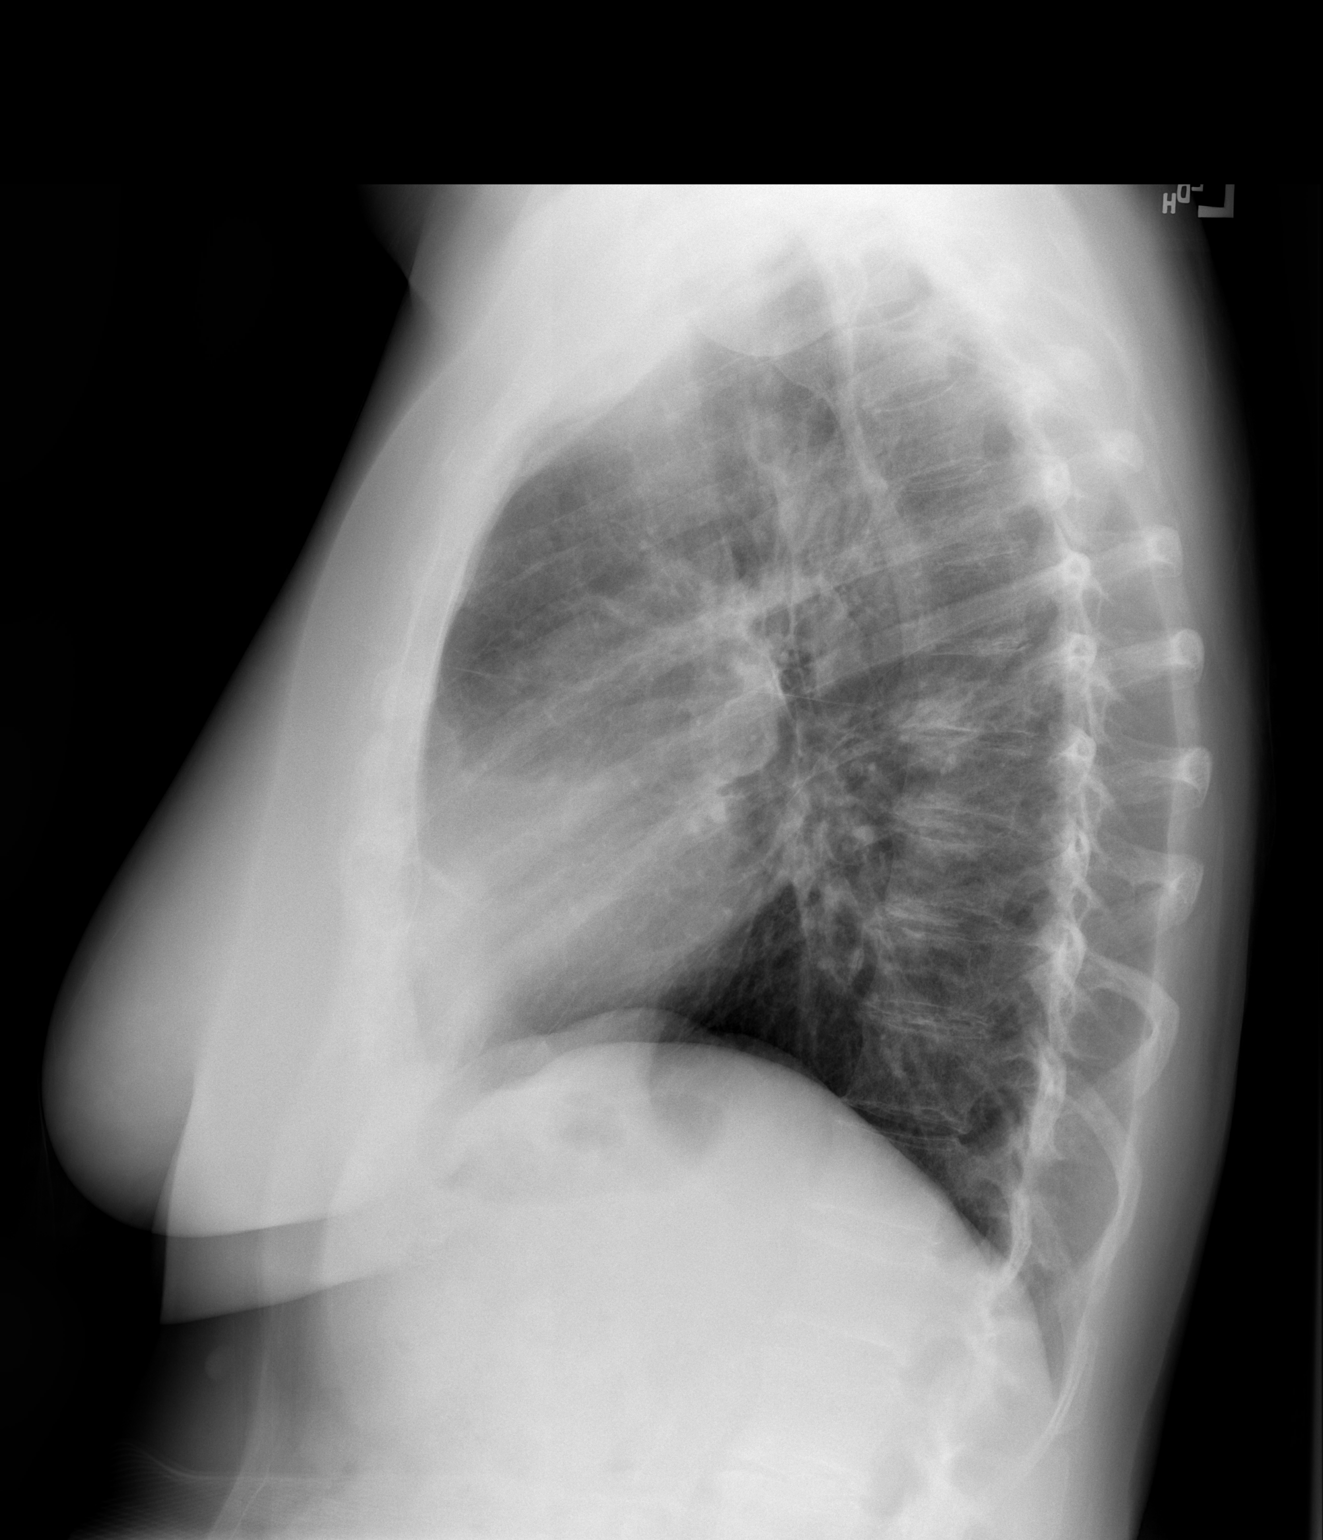

[2 of 2 positions shown; findings below may reference images not displayed]

FINDINGS: The lungs are adequately inflated. The interstitial markings in the
infrahilar region are chronic. New increased density in the lingula
and left mid lung is noted. The heart and pulmonary vascularity are
normal. The mediastinum is normal in width. There is no pleural
effusion. The bony thorax exhibits no acute abnormality.
IMPRESSION: Mild chronic bronchitic-reactive airway changes. Subsegmental
atelectasis or early pneumonia in the left mid and lower lung. No
overt CHF. Followup PA and lateral chest X-ray is recommended in 3-4
weeks following trial of antibiotic therapy to ensure resolution and
exclude underlying malignancy.

## 2020-11-04 ENCOUNTER — Other Ambulatory Visit: Payer: Self-pay

## 2020-11-04 ENCOUNTER — Encounter (INDEPENDENT_AMBULATORY_CARE_PROVIDER_SITE_OTHER): Payer: Medicare Other | Admitting: Ophthalmology

## 2020-11-04 DIAGNOSIS — H35033 Hypertensive retinopathy, bilateral: Secondary | ICD-10-CM | POA: Diagnosis not present

## 2020-11-04 DIAGNOSIS — H43813 Vitreous degeneration, bilateral: Secondary | ICD-10-CM

## 2020-11-04 DIAGNOSIS — H353132 Nonexudative age-related macular degeneration, bilateral, intermediate dry stage: Secondary | ICD-10-CM | POA: Diagnosis not present

## 2020-11-04 DIAGNOSIS — I10 Essential (primary) hypertension: Secondary | ICD-10-CM

## 2020-11-21 IMAGING — CR DG CHEST 2V
2 series · 2 of 2 positions shown · non-contrast
Comparison: 12/20/2017

CLINICAL DATA: Recent pneumonia.  Follow-up.  Shortness of breath

EXAM:
CHEST - 2 VIEW

[w chest pa]
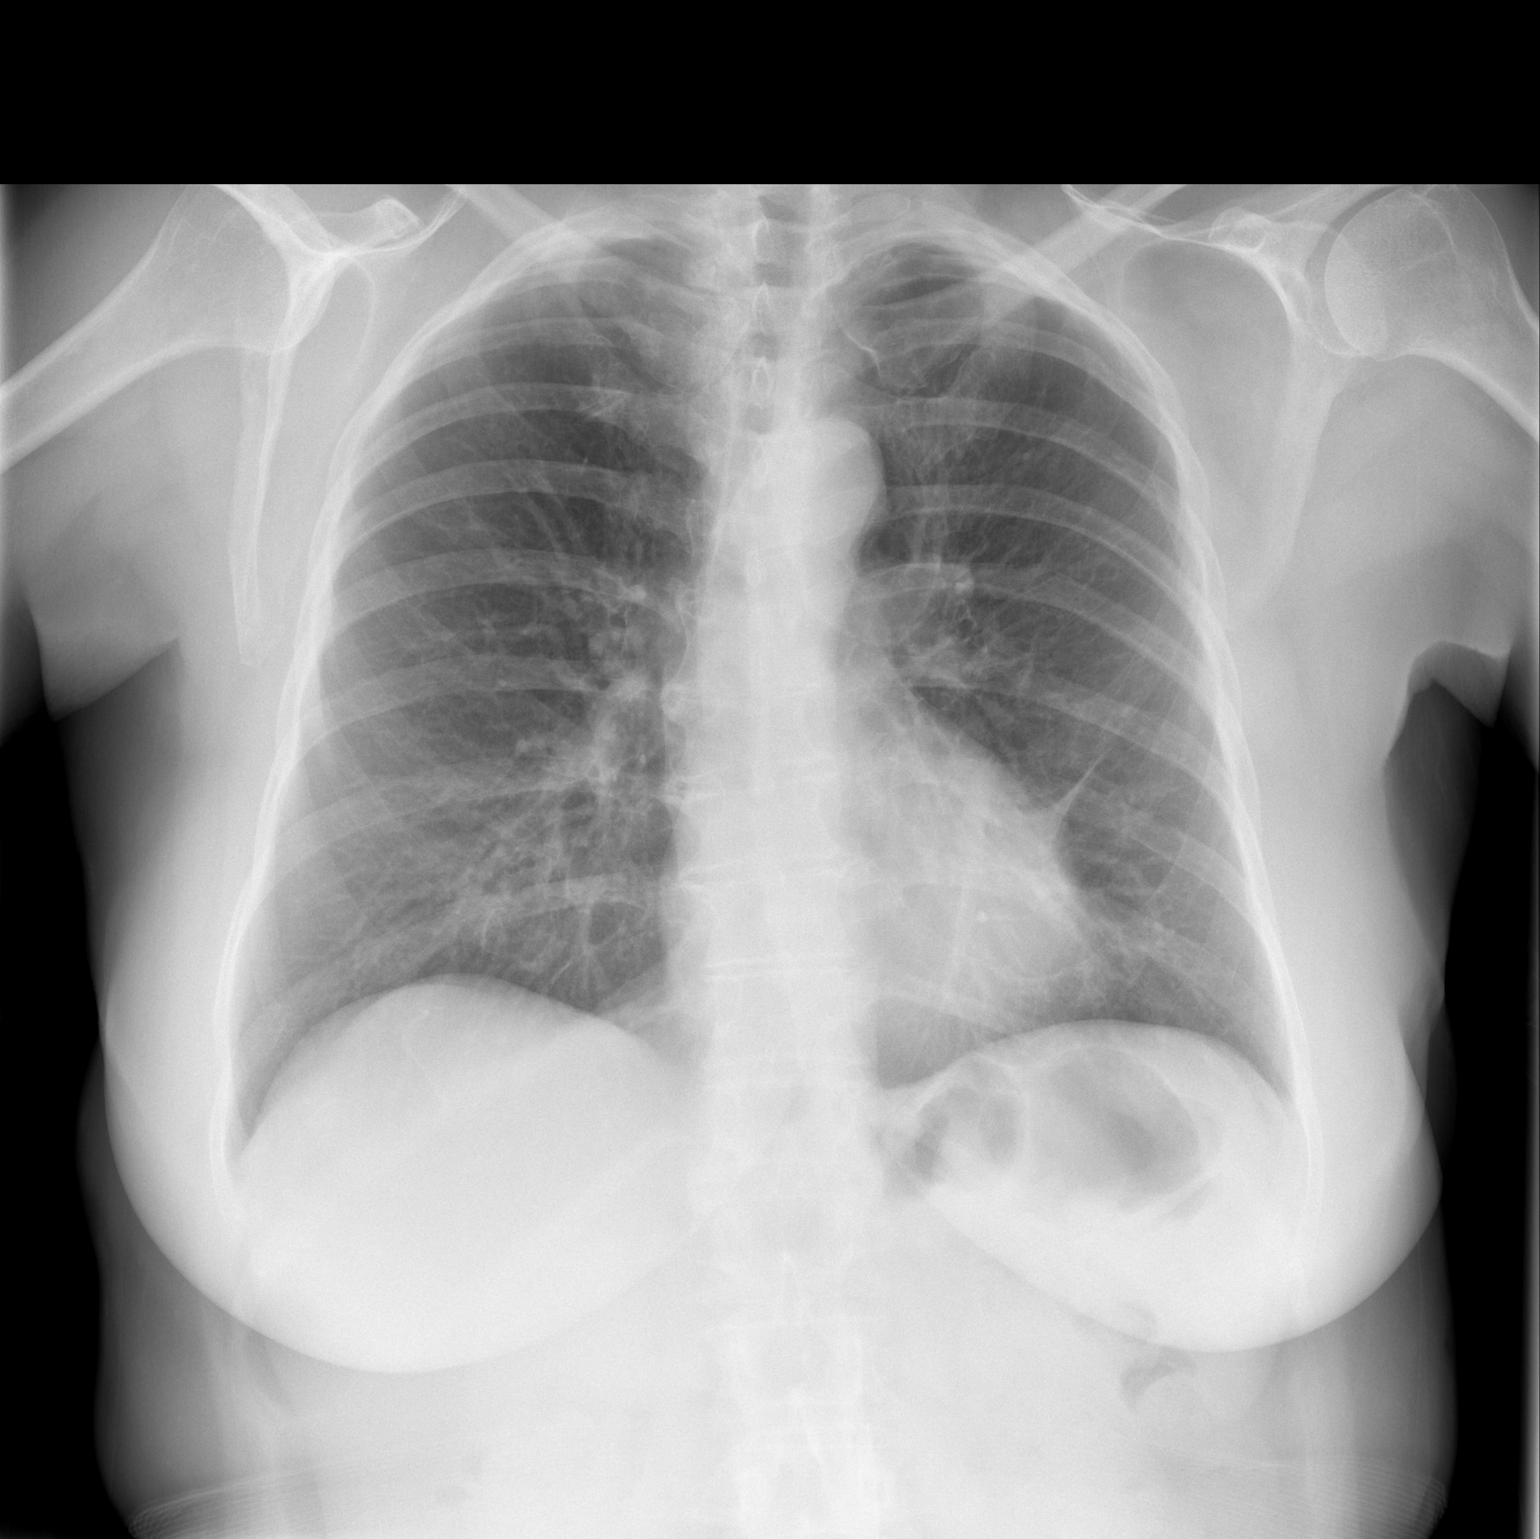

[w chest lat]
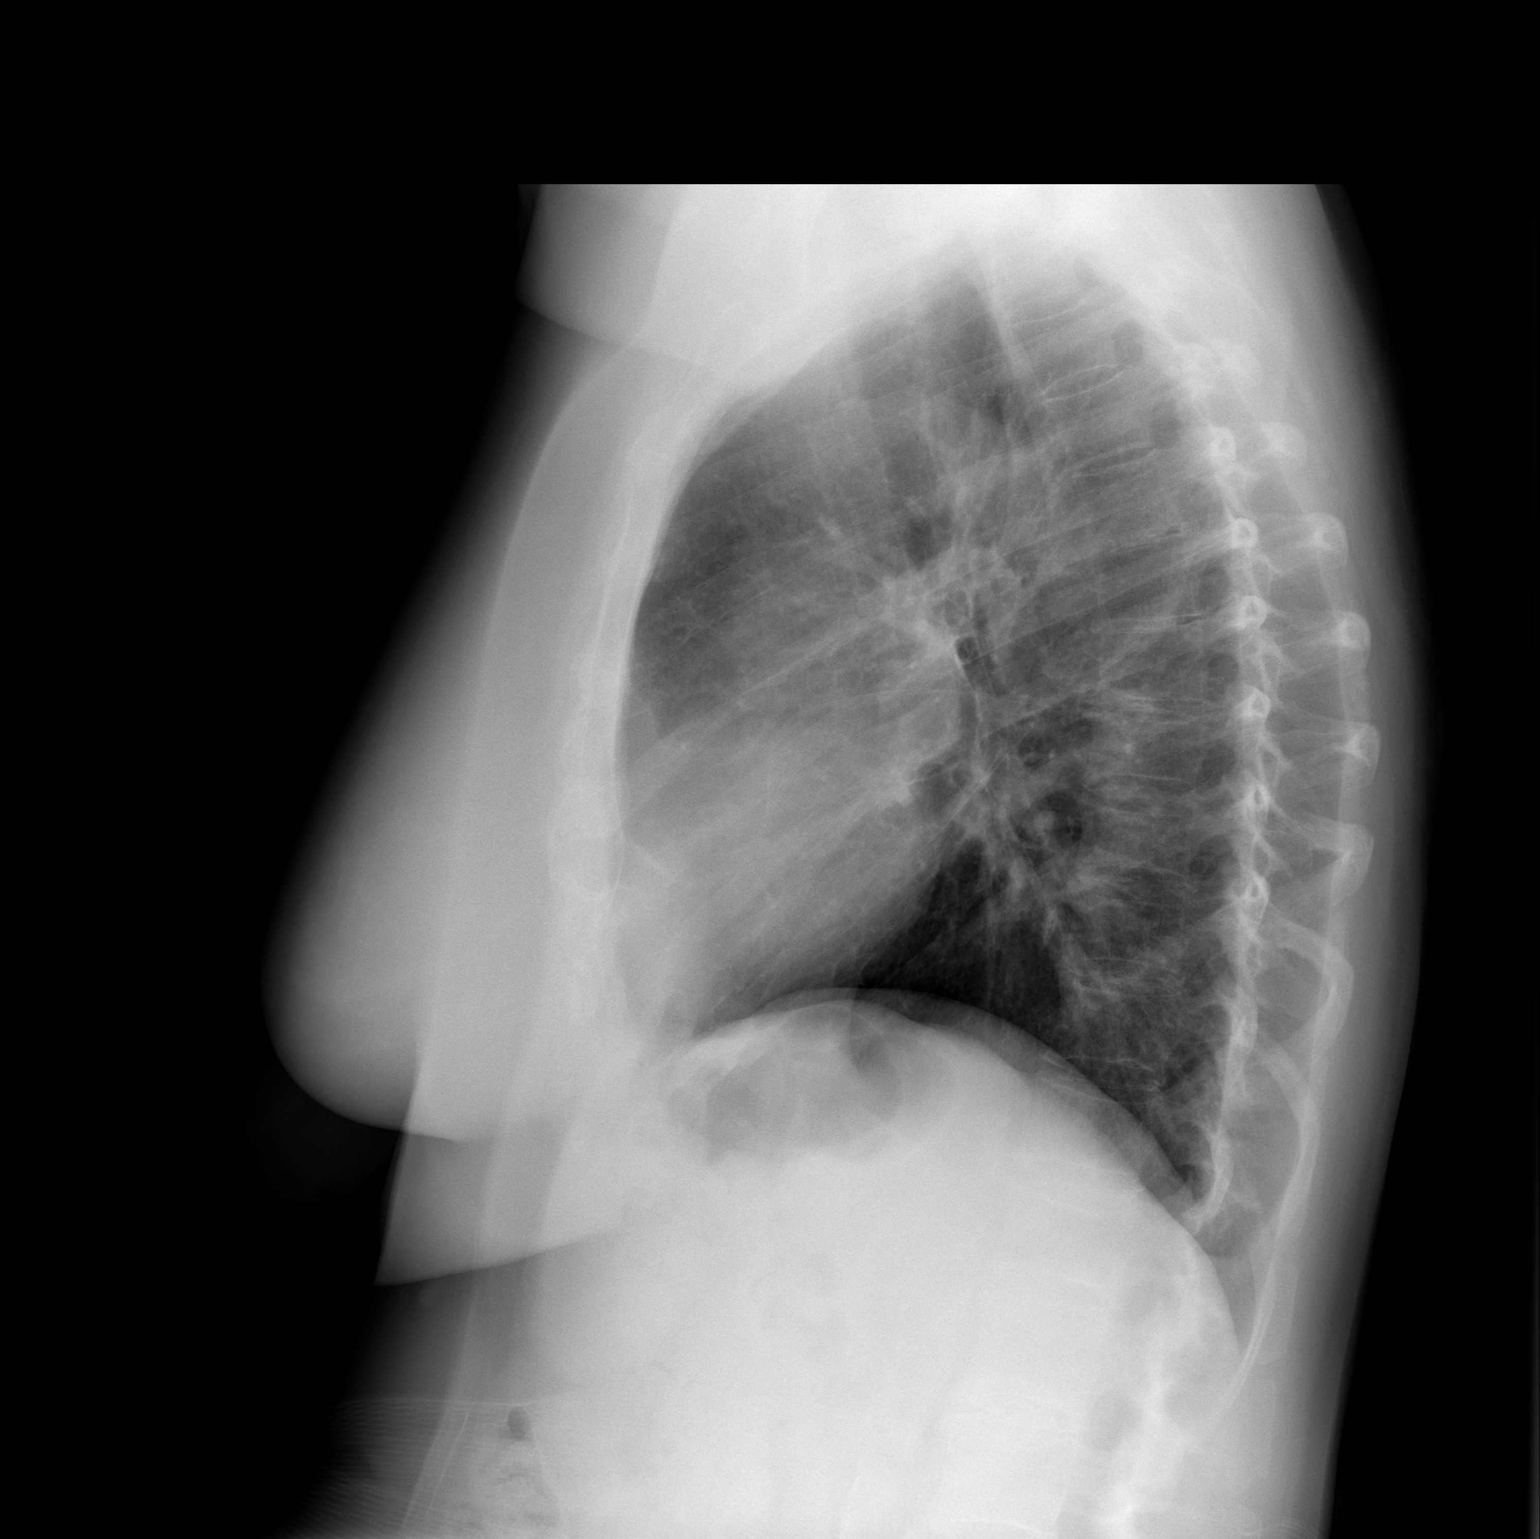

[2 of 2 positions shown; findings below may reference images not displayed]

FINDINGS: Linear densities in the lingula are stable, favor scarring. No acute
confluent airspace opacities or effusions. Heart is normal size. No
acute bony abnormality.
IMPRESSION: Lingular scarring.  No active disease.

## 2020-12-15 ENCOUNTER — Other Ambulatory Visit: Payer: Self-pay

## 2020-12-15 DIAGNOSIS — G500 Trigeminal neuralgia: Secondary | ICD-10-CM

## 2020-12-15 DIAGNOSIS — G894 Chronic pain syndrome: Secondary | ICD-10-CM

## 2020-12-15 MED ORDER — METHADONE HCL 10 MG PO TABS: 10.0000 mg | ORAL_TABLET | Freq: Every day | ORAL | 0 refills | Status: DC

## 2020-12-31 ENCOUNTER — Encounter: Payer: Self-pay | Admitting: Physical Medicine & Rehabilitation

## 2020-12-31 ENCOUNTER — Encounter: Payer: Medicare Other | Attending: Physical Medicine & Rehabilitation | Admitting: Physical Medicine & Rehabilitation

## 2020-12-31 ENCOUNTER — Other Ambulatory Visit: Payer: Self-pay

## 2020-12-31 VITALS — BP 131/69 | HR 82 | Temp 98.8°F | Ht 63.0 in | Wt 171.0 lb

## 2020-12-31 DIAGNOSIS — G500 Trigeminal neuralgia: Secondary | ICD-10-CM | POA: Diagnosis not present

## 2020-12-31 DIAGNOSIS — G894 Chronic pain syndrome: Secondary | ICD-10-CM | POA: Insufficient documentation

## 2020-12-31 MED ORDER — TAPENTADOL HCL 50 MG PO TABS: 50.0000 mg | ORAL_TABLET | Freq: Two times a day (BID) | ORAL | 0 refills | Status: DC | PRN

## 2020-12-31 MED ORDER — METHADONE HCL 10 MG PO TABS: 10.0000 mg | ORAL_TABLET | Freq: Every day | ORAL | 0 refills | Status: DC

## 2020-12-31 NOTE — Patient Instructions (Signed)
PLEASE FEEL FREE TO CALL OUR OFFICE WITH ANY PROBLEMS OR QUESTIONS ([phone])      

## 2020-12-31 NOTE — Progress Notes (Signed)
Subjective:    Patient ID: [name], female    DOB: Feb 04, 1946, 75 y.o.   MRN: [id number]  HPI  [name] is here in follow up of her chronic pain. She has been doing fairly well over the last 2 months. She is enjoying being "retired". She tries to stay active and walks regularly. She hasn't had any new health issues  She remains methadone for pain control with nucynta for more severe symptoms. These medications help control her pain and give her quality of life.   Her bowel habits are regular. Mood is positive.       Pain Inventory Average Pain 7 Pain Right Now 7 My pain is constant, burning, and stabbing  In the last 24 hours, has pain interfered with the following? General activity 8 Relation with others 10 Enjoyment of life 8 What TIME of day is your pain at its worst? morning , daytime, and evening Sleep (in general) Good  Pain is worse with:  talking Pain improves with: rest, medication, and rest Relief from Meds: 9  Family History  Problem Relation Age of Onset   Hypertension Mother    Transient ischemic attack Mother    Coronary artery disease Father        CHF   Coronary artery disease Sister    Macular degeneration Sister    Transient ischemic attack Maternal Grandmother    Diabetes Neg Hx    Cancer Neg Hx    Social History   Socioeconomic History   Marital status: Married    Spouse name: Not on file   Number of children: 1   Years of education: Not on file   Highest education level: Not on file  Occupational History   Occupation: Retired  Tobacco Use   Smoking status: Never   Smokeless tobacco: Never  Vaping Use   Vaping Use: Never used  Substance and Sexual Activity   Alcohol use: No    Alcohol/week: 0.0 standard drinks   Drug use: No   Sexual activity: Not on file  Other Topics Concern   Not on file  Social History Narrative   Not on file   Social Determinants of Health   Financial Resource Strain: Not on file  Food  Insecurity: Not on file  Transportation Needs: Not on file  Physical Activity: Not on file  Stress: Not on file  Social Connections: Not on file   Past Surgical History:  Procedure Laterality Date   [place]   closed hole in heart   CATARACT EXTRACTION Left 02/02/2016   LUMBAR [place]     Dr Eddie Dibbles   no colonoscopy     "scared"; [facility] reviewed   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; 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   Past Surgical History:  Procedure Laterality Date   [place]   closed hole in heart   CATARACT EXTRACTION Left 02/02/2016   LUMBAR [place] [facility]     Dr Eddie Dibbles   no colonoscopy     "scared"; [facility] reviewed   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; 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   Past Medical History:  Diagnosis Date   Abnormality of gait    Brachial neuritis or radiculitis NOS    Cervicalgia    Facial nerve disorder    Hearing loss    Left  ear   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   Hyperlipidemia    Hypertension    Macular degeneration    Myofascial pain    Pneumonia 2005   Thyroid disease    hyprothyroidism   Trigeminal neuralgia    BP 131/69   Pulse 82   Temp 98.8 F (37.1 C)   Ht 5\' 3"  (1.6 m)   Wt 171 lb (77.6 kg)   SpO2 97%   BMI 30.29 kg/m   Opioid Risk Score:   Fall Risk Score:  `1  Depression screen PHQ 2/9  Depression screen [facility] 2/9 12/25/2019 04/24/2018 10/24/2017 06/20/2017 02/23/2017 12/29/2016 02/04/2016  Decreased Interest 0 0 1 0 0 0 0  Down, Depressed, Hopeless 0 0 1 0 0 0 0  PHQ - 2 Score 0 0 2 0 0 0 0  Altered sleeping - - - - - - -  Tired, decreased energy - - - - - - -  Change in appetite - - - - - - -  Feeling bad or failure about yourself  - - - - - - -  Trouble concentrating - - - - - - -  Moving slowly or fidgety/restless - - - - - - -  Suicidal thoughts - - - - - - -  PHQ-9 Score - - - - - - -    Review of Systems   Neurological:  Positive for facial asymmetry and numbness.       Facial pain  All other systems reviewed and are negative.     Objective:   Physical Exam  General: No acute distress HEENT: NCAT, EOMI, oral membranes moist Cards: reg rate  Chest: normal effort Abdomen: Soft, NT, ND Skin: dry, intact Extremities: no edema Psych: pleasant and appropriate  Neuro: left C7, sensory loss to left face.  Strength is 5 out of 5 in both lower extremities with normal sensory exam. stable appearance Musculoskeletal: Full ROM, No pain with AROM or PROM in the neck, trunk, or extremities. Posture appropriate      Assessment & Plan:  ASSESSMENT: 1. History of vestibular schwannoma 2005 resulting in trigeminal neuralgia and left facial nerve injury- no tumor regrowth per MRI--has chronic peri-operative scarring and atrophy which accounts for weakness, pain, and balance issues.             -neurologically stable. 2. History of cervicalgia and lumbar spine surgery---recent low back strain? 3. Chronic pain related to CN 5 involvement 4. Hypothyroid on supplementation 5. Myofascial pain left neck/shoulder.  improved   6. Reactive depression and anxiety   7.  Low back pain with history of previous lumbar surgery.  Symptoms are fairly mild at this point.       PLAN: 1. Methadone 10 mg daily, #30 refilled.   Refilled today     Nucynta 50 mg 1 p.o. q.12 h. p.r.n. 30.    Refilled today x 1 We will continue the controlled substance monitoring program, this consists of regular clinic visits, examinations, routine drug screening, pill counts as well as use of [place] Controlled Substance Reporting System. [place] was reviewed today.   Medication was refilled and a second prescription for methadone was sent to the patient's pharmacy for next month.     2. Continue to be aware of appropriate posture and proper body mechanics. She stays active 3.  Hypothyroid 4. continue with voltaren gel, topicals.    5. eye mgt per ENT  15 minutes of face to face patient care time were spent during this visit. All questions were encouraged and answered.  Follow up with me in 2 mos .

## 2021-03-19 ENCOUNTER — Other Ambulatory Visit: Payer: Self-pay | Admitting: Orthopedic Surgery

## 2021-03-19 ENCOUNTER — Other Ambulatory Visit: Payer: Self-pay

## 2021-03-19 DIAGNOSIS — M545 Low back pain, unspecified: Secondary | ICD-10-CM

## 2021-03-25 ENCOUNTER — Encounter: Payer: Self-pay | Admitting: Physical Medicine & Rehabilitation

## 2021-03-25 ENCOUNTER — Other Ambulatory Visit: Payer: Self-pay

## 2021-03-25 ENCOUNTER — Encounter: Payer: Medicare Other | Attending: Physical Medicine & Rehabilitation | Admitting: Physical Medicine & Rehabilitation

## 2021-03-25 VITALS — BP 179/68 | HR 104 | Ht 63.0 in | Wt 165.0 lb

## 2021-03-25 DIAGNOSIS — M47816 Spondylosis without myelopathy or radiculopathy, lumbar region: Secondary | ICD-10-CM | POA: Diagnosis not present

## 2021-03-25 DIAGNOSIS — D333 Benign neoplasm of cranial nerves: Secondary | ICD-10-CM | POA: Diagnosis not present

## 2021-03-25 DIAGNOSIS — G894 Chronic pain syndrome: Secondary | ICD-10-CM | POA: Insufficient documentation

## 2021-03-25 DIAGNOSIS — G500 Trigeminal neuralgia: Secondary | ICD-10-CM | POA: Diagnosis not present

## 2021-03-25 MED ORDER — TAPENTADOL HCL 50 MG PO TABS: 50.0000 mg | ORAL_TABLET | Freq: Two times a day (BID) | ORAL | 0 refills | Status: DC | PRN

## 2021-03-25 MED ORDER — METHADONE HCL 10 MG PO TABS: 10.0000 mg | ORAL_TABLET | Freq: Every day | ORAL | 0 refills | Status: DC

## 2021-03-25 NOTE — Progress Notes (Signed)
Subjective:    Patient ID: [name], female    DOB: [birth year], 76 y.o.   MRN: [id number]  HPI  [name] is here in follow-up of her vestibular schwannoma and associated pain.  She does feel that over the last couple months that her facial pain has increased.  She is having to use a bit more of her Nucynta sometimes taking it daily.  She also developed some problems with her low back.  She is seeing [facility] currently.  An MRI was ordered but is still pending insurance approval.  They have discussed doing an epidural on her back.  She had previous back surgery remotely.  Otherwise she remains on methadone 10 mg daily for her facial pain.  Pain Inventory Average Pain 7 Pain Right Now 5 My pain is sharp, burning, stabbing, and tingling  In the last 24 hours, has pain interfered with the following? General activity 9 Relation with others 10 Enjoyment of life 9 What TIME of day is your pain at its worst? morning  and daytime Sleep (in general) Fair  Pain is worse with:  talking Pain improves with: heat/ice and medication Relief from Meds: 6  Family History  Problem Relation Age of Onset   Hypertension Mother    Transient ischemic attack Mother    Coronary artery disease Father        CHF   Coronary artery disease Sister    Macular degeneration Sister    Transient ischemic attack Maternal Grandmother    Diabetes Neg Hx    Cancer Neg Hx    Social History   Socioeconomic History   Marital status: Married    Spouse name: Not on file   Number of children: 1   Years of education: Not on file   Highest education level: Not on file  Occupational History   Occupation: Retired  Tobacco Use   Smoking status: Never   Smokeless tobacco: Never  Vaping Use   Vaping Use: Never used  Substance and Sexual Activity   Alcohol use: No    Alcohol/week: 0.0 standard drinks   Drug use: No   Sexual activity: Not on file  Other Topics Concern   Not on file  Social  History Narrative   Not on file   Social Determinants of Health   Financial Resource Strain: Not on file  Food Insecurity: Not on file  Transportation Needs: Not on file  Physical Activity: Not on file  Stress: Not on file  Social Connections: Not on file   Past Surgical History:  Procedure Laterality Date   [place]   closed hole in heart   CATARACT EXTRACTION Left 02/02/2016   LUMBAR [place]     Dr Eddie Dibbles   no colonoscopy     "scared"; [facility] reviewed   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; 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   Past Surgical History:  Procedure Laterality Date   [place]   closed hole in heart   CATARACT EXTRACTION Left 02/02/2016   LUMBAR [place]     Dr Eddie Dibbles   no colonoscopy     "scared"; [facility] reviewed   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; 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   Past Medical History:  Diagnosis Date   Abnormality of gait    Brachial neuritis or radiculitis  NOS    Cervicalgia    Facial nerve disorder    Hearing loss    Left ear   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   Hyperlipidemia    Hypertension    Macular degeneration    Myofascial pain    Pneumonia 2005   Thyroid disease    hyprothyroidism   Trigeminal neuralgia    BP (!) 179/68    Pulse (!) 104    Ht 5\' 3"  (1.6 m)    Wt 165 lb (74.8 kg)    SpO2 99%    BMI 29.23 kg/m   Opioid Risk Score:   Fall Risk Score:  `1  Depression screen PHQ 2/9  Depression screen [facility] 2/9 03/25/2021 12/31/2020 12/25/2019 04/24/2018 10/24/2017 06/20/2017 02/23/2017  Decreased Interest 0 0 0 0 1 0 0  Down, Depressed, Hopeless 0 0 0 0 1 0 0  PHQ - 2 Score 0 0 0 0 2 0 0  Altered sleeping - - - - - - -  Tired, decreased energy - - - - - - -  Change in appetite - - - - - - -  Feeling bad or failure about yourself  - - - - - - -  Trouble concentrating - - - - - - -  Moving slowly or fidgety/restless -  - - - - - -  Suicidal thoughts - - - - - - -  PHQ-9 Score - - - - - - -  Some recent data might be hidden     Review of Systems  Musculoskeletal:        Left side of face  All other systems reviewed and are negative.     Objective:   Physical Exam General: No acute distress HEENT: NCAT, EOMI, oral membranes moist Cards: reg rate  Chest: normal effort Abdomen: Soft, NT, ND Skin: dry, intact Extremities: no edema Psych: pleasant and appropriate   Neuro: left C7, sensory loss to left face.  Strength is 5 out of 5 in both lower extremities with normal sensory exam. --no changes  Musculoskeletal: low back with some tenderness in transferring and with flexion and extension.  I did not examine her back in detail today.     Assessment & Plan:  ASSESSMENT: 1. History of vestibular schwannoma 2005 resulting in trigeminal neuralgia and left facial nerve injury- no tumor regrowth per MRI--has chronic peri-operative scarring and atrophy which accounts for weakness, pain, and balance issues.             -neurologically stable. 2. History of cervicalgia and lumbar spine surgery---recent low back strain? 3. Chronic pain related to CN 5 involvement 4. Hypothyroid on supplementation 5. Myofascial pain left neck/shoulder.  improved   6. Reactive depression and anxiety   7.  Low back pain with history of previous lumbar surgery.  Symptoms have been progressing   PLAN: 1. Methadone 10 mg daily, #30 refilled.   Refilled today     Nucynta 50 mg 1 p.o. q.12 h. p.r.n. 30.    Refilled today We will continue the controlled substance monitoring program, this consists of regular clinic visits, examinations, routine drug screening, pill counts as well as use of [place] Controlled Substance Reporting System. [place] was [place] today.  Medication was refilled and a second prescription was sent to the patient's pharmacy for next month.     2.  Discussed her back little bit today.  Again we reviewed  stretching and  mechanics.  She is carrying a big bag with her that weighs probably 5 pounds or so.  I told her I be happy to review any of her imaging when she does eventually have it.  She will call me and let me know.   3.  Hypothyroid 4. continue with voltaren gel, topicals.   5. eye mgt per ENT     15 minutes of face to face patient care time were spent during this visit. All questions were encouraged and answered.  Follow up with me in 2 months  .

## 2021-03-25 NOTE — Patient Instructions (Signed)
PLEASE FEEL FREE TO CALL OUR OFFICE WITH ANY PROBLEMS OR QUESTIONS ([phone])      

## 2021-04-18 ENCOUNTER — Ambulatory Visit
Admission: RE | Admit: 2021-04-18 | Discharge: 2021-04-18 | Disposition: A | Discharge status: Home / Self Care | Payer: Medicare Other | Source: Ambulatory Visit | Attending: Orthopedic Surgery | Admitting: Orthopedic Surgery

## 2021-04-18 ENCOUNTER — Other Ambulatory Visit: Payer: Self-pay

## 2021-04-18 DIAGNOSIS — M545 Low back pain, unspecified: Secondary | ICD-10-CM

## 2021-05-19 ENCOUNTER — Encounter: Payer: Self-pay | Admitting: Registered Nurse

## 2021-05-19 ENCOUNTER — Encounter: Payer: Medicare Other | Attending: Physical Medicine & Rehabilitation | Admitting: Registered Nurse

## 2021-05-19 VITALS — BP 152/80 | HR 75 | Ht 63.0 in | Wt 168.4 lb

## 2021-05-19 DIAGNOSIS — Z79899 Other long term (current) drug therapy: Secondary | ICD-10-CM | POA: Diagnosis present

## 2021-05-19 DIAGNOSIS — Z79891 Long term (current) use of opiate analgesic: Secondary | ICD-10-CM | POA: Insufficient documentation

## 2021-05-19 DIAGNOSIS — M47816 Spondylosis without myelopathy or radiculopathy, lumbar region: Secondary | ICD-10-CM | POA: Insufficient documentation

## 2021-05-19 DIAGNOSIS — M5416 Radiculopathy, lumbar region: Secondary | ICD-10-CM | POA: Diagnosis present

## 2021-05-19 DIAGNOSIS — G500 Trigeminal neuralgia: Secondary | ICD-10-CM | POA: Diagnosis present

## 2021-05-19 DIAGNOSIS — G894 Chronic pain syndrome: Secondary | ICD-10-CM | POA: Diagnosis present

## 2021-05-19 DIAGNOSIS — D333 Benign neoplasm of cranial nerves: Secondary | ICD-10-CM | POA: Insufficient documentation

## 2021-05-19 MED ORDER — TAPENTADOL HCL 50 MG PO TABS: 50.0000 mg | ORAL_TABLET | Freq: Two times a day (BID) | ORAL | 0 refills | Status: DC | PRN

## 2021-05-19 MED ORDER — METHADONE HCL 10 MG PO TABS: 10.0000 mg | ORAL_TABLET | Freq: Every day | ORAL | 0 refills | Status: DC

## 2021-05-19 NOTE — Progress Notes (Signed)
? ?Subjective:  ? ? Patient ID: [name], female    DOB: April 27, 1945, 76 y.o.   MRN: [id number] ? ?HPI: [name] is a 76 y.o. female who returns for follow up appointment for chronic pain and medication refill. She states her pain is located in her lower back radiating into left lower extremity and reports left facial pain with tingling . She rates her pain 6. Her current exercise regime is walking and performing stretching exercises. ? ?Ms. Nachtigal Morphine equivalent is 90.00 MME.   UDS ordered today. ?  ? ?Pain Inventory ?Average Pain 8 ?Pain Right Now 6 ?My pain is sharp, burning, tingling, and aching ? ?In the last 24 hours, has pain interfered with the following? ?General activity 7 ?Relation with others 9 ?Enjoyment of life 9 ?What TIME of day is your pain at its worst? daytime ?Sleep (in general) Good ? ?Pain is worse with: inactivity and talking ?Pain improves with: medication ?Relief from Meds: 8 ? ?Family History  ?Problem Relation Age of Onset  ? Hypertension Mother   ? Transient ischemic attack Mother   ? Coronary artery disease Father   ?     CHF  ? Coronary artery disease Sister   ? Macular degeneration Sister   ? Transient ischemic attack Maternal Grandmother   ? Diabetes Neg Hx   ? Cancer Neg Hx   ? ?Social History  ? ?Socioeconomic History  ? Marital status: Married  ?  Spouse name: Not on file  ? Number of children: 1  ? Years of education: Not on file  ? Highest education level: Not on file  ?Occupational History  ? Occupation: Retired  ?Tobacco Use  ? Smoking status: Never  ? Smokeless tobacco: Never  ?Vaping Use  ? Vaping Use: Never used  ?Substance and Sexual Activity  ? Alcohol use: No  ?  Alcohol/week: 0.0 standard drinks  ? Drug use: No  ? Sexual activity: Not on file  ?Other Topics Concern  ? Not on file  ?Social History Narrative  ? Not on file  ? ?Social Determinants of Health  ? ?Financial Resource Strain: Not on file  ?Food Insecurity: Not on file  ?Transportation  Needs: Not on file  ?Physical Activity: Not on file  ?Stress: Not on file  ?Social Connections: Not on file  ? ?Past Surgical History:  ?Procedure Laterality Date  ? ABDOMINAL HYSTERECTOMY    ? [place]  ? closed hole in heart  ? CATARACT EXTRACTION Left 02/02/2016  ? LUMBAR DISC SURGERY    ? Dr Eddie Dibbles  ? no colonoscopy    ? "scared"; SOC reviewed  ? 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 ?Past Surgical History:  ?Procedure Laterality Date  ? ABDOMINAL HYSTERECTOMY    ? [place]  ? closed hole in heart  ? CATARACT EXTRACTION Left 02/02/2016  ? LUMBAR DISC SURGERY    ? Dr Eddie Dibbles  ? no colonoscopy    ? "scared"; SOC reviewed  ? 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 ?Past Medical History:  ?Diagnosis Date  ? Abnormality of gait   ? Brachial neuritis or radiculitis NOS   ? Cervicalgia   ? Facial nerve disorder   ? Hearing loss   ? Left ear  ? History of brain tumor 2005  ? at base of brain  stem  ? Hyperlipidemia   ? Hypertension   ? Macular degeneration   ? Myofascial pain   ? Pneumonia 2005  ? Thyroid disease   ? hyprothyroidism  ? Trigeminal neuralgia   ? ?BP (!) 152/80   Pulse 75   Ht '5\' 3"'$  (1.6 m)   Wt 168 lb 6.4 oz (76.4 kg)   SpO2 97%   BMI 29.83 kg/m?  ? ?Opioid Risk Score:   ?Fall Risk Score:  `1 ? ?Depression screen PHQ 2/9 ? ? ?  05/19/2021  ?  9:00 AM 03/25/2021  ?  3:38 PM 12/31/2020  ?  2:53 PM 12/25/2019  ?  9:14 AM 04/24/2018  ?  9:44 AM 10/24/2017  ?  9:47 AM 06/20/2017  ?  9:30 AM  ?Depression screen PHQ 2/9  ?Decreased Interest 0 0 0 0 0 1 0  ?Down, Depressed, Hopeless 0 0 0 0 0 1 0  ?PHQ - 2 Score 0 0 0 0 0 2 0  ?  ? ?Review of Systems  ?Constitutional: Negative.   ?HENT: Negative.    ?Eyes: Negative.   ?Respiratory: Negative.    ?Cardiovascular: Negative.   ?Gastrointestinal: Negative.   ?Endocrine: Negative.   ?Genitourinary: Negative.   ?Musculoskeletal: Negative.   ?Skin: Negative.   ?Allergic/Immunologic:  Negative.   ?Neurological: Negative.   ?Hematological: Negative.   ?Psychiatric/Behavioral: Negative.    ? ?   ?Objective:  ? Physical Exam ?Vitals and nursing note reviewed.  ?Constitutional:   ?   Appearance: Normal appearance.  ?Cardiovascular:  ?   Rate and Rhythm: Normal rate and regular rhythm.  ?   Pulses: Normal pulses.  ?   Heart sounds: Normal heart sounds.  ?Pulmonary:  ?   Effort: Pulmonary effort is normal.  ?   Breath sounds: Normal breath sounds.  ?Musculoskeletal:  ?   Cervical back: Normal range of motion and neck supple.  ?   Comments: Normal Muscle Bulk and Muscle Testing Reveals:  ?Upper Extremities: Full ROM and Muscle Strength 5/5 ?Lumbar Paraspinal Tenderness: L-3-L-5 ?Lower Extremities: Full ROM and Muscle Strength 5/5  ?Left Lower Extremity Flexion Produces Pain into her Lumbar ?Arises from Table with ease ?Narrow Based Gait  ? ?   ?Skin: ?   General: Skin is warm and dry.  ?Neurological:  ?   Mental Status: She is alert and oriented to person, place, and time.  ?Psychiatric:     ?   Mood and Affect: Mood normal.     ?   Behavior: Behavior normal.  ? ? ? ? ?   ?Assessment & Plan:  ?1. History of vestibular schwannoma resulting in trigeminal neuralgia and left facial nerve injury./ Dizziness: Schedule for MRI w/WO Contrast. This was discussed with Dr Naaman Plummer, he agrees with plan.  05/19/2021. ?Refilled: Methadone 10 mg daily #30, second script sent for the following month and Nucunta 50 mg one tablet every 12 hours as needed #30.  ?We will continue the opioid monitoring program, this consists of regular clinic visits, examinations, urine drug screen, pill counts as well as use of [place] Controlled Substance Reporting system. A 12 month History has been reviewed on the [place] Controlled Substance Reporting System on 05/19/2021.  ?2. History of cervicalgia/ Cervical Radiculitis: Continue current medication regimen. . Continue HEP and Continue to Monitor. 05/19/2021 ?3. Chronic  pain related to CN 5 involvement : Continue Voltaren Gel and current medication regimen. Continue to monitor. 05/19/2021 ? 4. Lumbar Radiculitis: Continue current medication  Regimen. Continue Nucynta. 05/19/2021 ?5. Greater Trochanter Bursitis of Left Hip: No complaints today. Alternate with Ice and Heat Therapy. Continue to Monitor. 05/19/2021 ?  ?F/U in 2 months  ?  ?  ?  ? ?

## 2021-05-23 LAB — TOXASSURE SELECT,+ANTIDEPR,UR

## 2021-05-26 ENCOUNTER — Telehealth: Payer: Self-pay | Admitting: *Deleted

## 2021-05-26 NOTE — Telephone Encounter (Signed)
U[place] drug screen for this encounter is consistent for prescribed medication 

## 2021-05-27 ENCOUNTER — Telehealth: Payer: Self-pay | Admitting: Registered Nurse

## 2021-05-27 DIAGNOSIS — G500 Trigeminal neuralgia: Secondary | ICD-10-CM

## 2021-05-27 DIAGNOSIS — M47816 Spondylosis without myelopathy or radiculopathy, lumbar region: Secondary | ICD-10-CM

## 2021-05-27 MED ORDER — METHADONE HCL 10 MG PO TABS: 10.0000 mg | ORAL_TABLET | Freq: Every day | ORAL | 0 refills | Status: DC

## 2021-05-27 NOTE — Telephone Encounter (Signed)
PMP was Reviewed.  ?Methadone prescription sent to [facility].  ?[name] is aware via My-Chart message.  ?

## 2021-06-23 ENCOUNTER — Other Ambulatory Visit: Payer: Self-pay

## 2021-06-23 DIAGNOSIS — M47816 Spondylosis without myelopathy or radiculopathy, lumbar region: Secondary | ICD-10-CM

## 2021-06-23 DIAGNOSIS — G500 Trigeminal neuralgia: Secondary | ICD-10-CM

## 2021-06-23 NOTE — Telephone Encounter (Signed)
Filled  Written  ID  Drug  QTY  Days  Prescriber  RX #  Dispenser  Refill  Daily Dose*  Pymt Type  PMP  ?05/31/2021 05/19/2021 1  ?Nucynta 50 Mg Tablet ?30.00 15 Eu Tho [id number] [facility] [id number]) 0/0 40.00 MME Comm Ins [place] ?05/27/2021 05/27/2021 1  ?Methadone Hcl 10 Mg Tablet ?30.00 30 Eu Tho [id number] [facility] ([id number]) 0/0 30.00 MME Comm Ins [place] ?04/24/2021 03/25/2021 1  ?Methadone Hcl 10 Mg Tablet ?30.00 [place] [id number] Wal ([id number]) 0/0 30.00 MME Comm Ins [place] ? ?[facility] [address] ?

## 2021-06-24 MED ORDER — METHADONE HCL 10 MG PO TABS: 10.0000 mg | ORAL_TABLET | Freq: Every day | ORAL | 0 refills | Status: DC

## 2021-07-15 ENCOUNTER — Encounter: Payer: Self-pay | Admitting: Physical Medicine & Rehabilitation

## 2021-07-15 ENCOUNTER — Encounter: Payer: Medicare Other | Attending: Physical Medicine & Rehabilitation | Admitting: Physical Medicine & Rehabilitation

## 2021-07-15 ENCOUNTER — Ambulatory Visit: Payer: Medicare Other | Admitting: Registered Nurse

## 2021-07-15 DIAGNOSIS — M47816 Spondylosis without myelopathy or radiculopathy, lumbar region: Secondary | ICD-10-CM | POA: Insufficient documentation

## 2021-07-15 DIAGNOSIS — G894 Chronic pain syndrome: Secondary | ICD-10-CM | POA: Diagnosis present

## 2021-07-15 DIAGNOSIS — G500 Trigeminal neuralgia: Secondary | ICD-10-CM | POA: Diagnosis not present

## 2021-07-15 MED ORDER — METHADONE HCL 10 MG PO TABS: 10.0000 mg | ORAL_TABLET | Freq: Every day | ORAL | 0 refills | Status: DC

## 2021-07-15 MED ORDER — CARBAMAZEPINE ER 100 MG PO TB12: 100.0000 mg | ORAL_TABLET | Freq: Two times a day (BID) | ORAL | 2 refills | Status: DC

## 2021-07-15 MED ORDER — TAPENTADOL HCL 50 MG PO TABS: 50.0000 mg | ORAL_TABLET | Freq: Two times a day (BID) | ORAL | 0 refills | Status: DC | PRN

## 2021-07-15 NOTE — Patient Instructions (Signed)
TEGRETOL '100MG'$  AT NIGHT FOR ONE WEEK THEN TWICE DAILY.

## 2021-07-15 NOTE — Progress Notes (Signed)
Subjective:    Patient ID: [name], female    DOB: 03-01-1945, 76 y.o.   MRN: [id number]  HPI  [name] is back in follow up of her chronic pain syndrome. She feels that her face is weaker and that her pain has gradually gotten worse. Stress definitely affects her pain and she's had dental problems too. She remains on methadone and nucynta for pain control.   She denies any other new medical issues. She still stays generally active although she's not working anymore.     Pain Inventory Average Pain 8 Pain Right Now 8 My pain is sharp, burning, and tingling  LOCATION OF PAIN  face  BOWEL Number of stools per week: 6 Oral laxative use No  Type of laxative . Enema or suppository use No  History of colostomy No  Incontinent No   BLADDER Normal In and out cath, frequency . Able to self cath  . Bladder incontinence No  Frequent urination No  Leakage with coughing No  Difficulty starting stream No  Incomplete bladder emptying No    Mobility walk without assistance ability to climb steps?  yes do you drive?  yes  Function retired I need assistance with the following:  household duties  Neuro/Psych weakness anxiety  Prior Studies Any changes since last visit?  no  Physicians involved in your care Any changes since last visit?  no   Family History  Problem Relation Age of Onset   Hypertension Mother    Transient ischemic attack Mother    Coronary artery disease Father        CHF   Coronary artery disease Sister    Macular degeneration Sister    Transient ischemic attack Maternal Grandmother    Diabetes Neg Hx    Cancer Neg Hx    Social History   Socioeconomic History   Marital status: Married    Spouse name: Not on file   Number of children: 1   Years of education: Not on file   Highest education level: Not on file  Occupational History   Occupation: Retired  Tobacco Use   Smoking status: Never   Smokeless tobacco: Never  Vaping Use    Vaping Use: Never used  Substance and Sexual Activity   Alcohol use: No    Alcohol/week: 0.0 standard drinks   Drug use: No   Sexual activity: Not on file  Other Topics Concern   Not on file  Social History Narrative   Not on file   Social Determinants of Health   Financial Resource Strain: Not on file  Food Insecurity: Not on file  Transportation Needs: Not on file  Physical Activity: Not on file  Stress: Not on file  Social Connections: Not on file   Past Surgical History:  Procedure Laterality Date   [place]   closed hole in heart   CATARACT EXTRACTION Left 02/02/2016   LUMBAR DISC SURGERY     Dr Eddie Dibbles   no colonoscopy     "scared"; [facility] reviewed   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; 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   Past Medical History:  Diagnosis Date   Abnormality of gait    Brachial neuritis or radiculitis NOS    Cervicalgia    Facial nerve disorder    Hearing loss    Left ear   History of brain tumor 2005   at base of brain  stem   Hyperlipidemia    Hypertension    Macular degeneration    Myofascial pain    Pneumonia 2005   Thyroid disease    hyprothyroidism   Trigeminal neuralgia    BP 135/73   Pulse (!) 59   Ht '5\' 3"'$  (1.6 m)   Wt 162 lb 12.8 oz (73.8 kg)   SpO2 97%   BMI 28.84 kg/m   Opioid Risk Score:   Fall Risk Score:  `1  Depression screen PHQ 2/9     05/19/2021    9:00 AM 03/25/2021    3:38 PM 12/31/2020    2:53 PM 12/25/2019    9:14 AM 04/24/2018    9:44 AM 10/24/2017    9:47 AM 06/20/2017    9:30 AM  Depression screen PHQ 2/9  Decreased Interest 0 0 0 0 0 1 0  Down, Depressed, Hopeless 0 0 0 0 0 1 0  PHQ - 2 Score 0 0 0 0 0 2 0     Review of Systems  Musculoskeletal:        Face pain  All other systems reviewed and are negative.     Objective:   Physical Exam  General: No acute distress HEENT: NCAT, EOMI, oral membranes moist. Missing teeth on left Cards: reg rate  Chest:  normal effort Abdomen: Soft, NT, ND Skin: dry, intact Extremities: no edema Psych: pleasant and appropriate  Neuro: left C7, sensory loss to left face.  has more left facial weakness than previously. Strength is 5 out of 5 in both lower extremities with normal sensory exam.   Musculoskeletal: low back with some tenderness in transferring and with flexion and extension.  I did not examine her back in detail today.     Assessment & Plan:  ASSESSMENT: 1. History of vestibular schwannoma 2005 resulting in trigeminal neuralgia and left facial nerve injury- no tumor regrowth per MRI--has chronic peri-operative scarring and atrophy which accounts for weakness, pain, and balance issues.             -neurologically stable.is having some further atrophy of her left facial muscles.  2. History of cervicalgia and lumbar spine surgery---recent low back strain? 3. Chronic pain related to CN 5 involvement 4. Hypothyroid on supplementation 5. Myofascial pain left neck/shoulder.  improved   6. Reactive depression and anxiety   7.  Low back pain with history of previous lumbar surgery.  Symptoms have been progressing   PLAN: 1. Methadone 10 mg daily, #30 refilled.   Refilled today     Nucynta 50 mg 1 p.o. q.12 h. p.r.n. 30.    Refilled today We will continue the controlled substance monitoring program, this consists of regular clinic visits, examinations, routine drug screening, pill counts as well as use of [place] Controlled Substance Reporting System. [place] today.   Medication was refilled and a second prescription was sent to the patient's pharmacy for next month.  .     2. HEP for back  3.  Hypothyroid 4. continue with voltaren gel, topicals.   5. eye mgt per ENT 6. Will retry anticonvulsant--trial of tegretol , '100mg'$  qhs for a week and then bid  -she isn't interested in e-stim     15 minutes of face to face patient care time were spent during this visit. All questions were  encouraged and answered.  Follow up with me in 2 months  .

## 2021-07-17 ENCOUNTER — Other Ambulatory Visit: Payer: Self-pay | Admitting: Physical Medicine & Rehabilitation

## 2021-07-17 DIAGNOSIS — G500 Trigeminal neuralgia: Secondary | ICD-10-CM

## 2021-07-20 ENCOUNTER — Other Ambulatory Visit: Payer: Self-pay | Admitting: Physical Medicine & Rehabilitation

## 2021-07-20 DIAGNOSIS — G500 Trigeminal neuralgia: Secondary | ICD-10-CM

## 2021-08-17 ENCOUNTER — Encounter: Payer: Medicare Other | Attending: Physical Medicine & Rehabilitation | Admitting: Registered Nurse

## 2021-08-17 ENCOUNTER — Encounter: Payer: Self-pay | Admitting: Registered Nurse

## 2021-08-17 VITALS — BP 158/81 | HR 61 | Ht 63.0 in | Wt 163.2 lb

## 2021-08-17 DIAGNOSIS — G894 Chronic pain syndrome: Secondary | ICD-10-CM | POA: Diagnosis present

## 2021-08-17 DIAGNOSIS — D333 Benign neoplasm of cranial nerves: Secondary | ICD-10-CM | POA: Insufficient documentation

## 2021-08-17 DIAGNOSIS — Z79899 Other long term (current) drug therapy: Secondary | ICD-10-CM | POA: Insufficient documentation

## 2021-08-17 DIAGNOSIS — G500 Trigeminal neuralgia: Secondary | ICD-10-CM | POA: Insufficient documentation

## 2021-08-17 DIAGNOSIS — Z79891 Long term (current) use of opiate analgesic: Secondary | ICD-10-CM | POA: Diagnosis present

## 2021-08-17 DIAGNOSIS — M5416 Radiculopathy, lumbar region: Secondary | ICD-10-CM | POA: Diagnosis not present

## 2021-08-17 DIAGNOSIS — M47816 Spondylosis without myelopathy or radiculopathy, lumbar region: Secondary | ICD-10-CM | POA: Diagnosis not present

## 2021-08-17 MED ORDER — METHADONE HCL 10 MG PO TABS: 10.0000 mg | ORAL_TABLET | Freq: Every day | ORAL | 0 refills | Status: DC

## 2021-08-17 MED ORDER — TAPENTADOL HCL 50 MG PO TABS: 50.0000 mg | ORAL_TABLET | Freq: Two times a day (BID) | ORAL | 0 refills | Status: DC | PRN

## 2021-08-17 NOTE — Progress Notes (Signed)
Subjective:    Patient ID: [name], female    DOB: 1945/08/12, 76 y.o.   MRN: [id number]  HPI: [name] is a 76 y.o. female who returns for follow up appointment for chronic pain and medication refill. She reports left facial pain and lower back pain radiating into her left lower extremity. She rates her pain 8. Her current exercise regime is walking and performing stretching exercises. [name] reports she has retired and is scheduled for dental work on 08/26/2021.  [name] Morphine equivalent is 43.67 MME.   Last UDS was on 05/19/2021, it was consistent.     Pain Inventory Average Pain 8 Pain Right Now 8 My pain is burning and tingling  In the last 24 hours, has pain interfered with the following? General activity 8 Relation with others 9 Enjoyment of life 10 What TIME of day is your pain at its worst? daytime and evening Sleep (in general) Good  Pain is worse with:  talking Pain improves with: heat/ice and medication Relief from Meds: 7  Family History  Problem Relation Age of Onset   Hypertension Mother    Transient ischemic attack Mother    Coronary artery disease Father        CHF   Coronary artery disease Sister    Macular degeneration Sister    Transient ischemic attack Maternal Grandmother    Diabetes Neg Hx    Cancer Neg Hx    Social History   Socioeconomic History   Marital status: Married    Spouse name: Not on file   Number of children: 1   Years of education: Not on file   Highest education level: Not on file  Occupational History   Occupation: Retired  Tobacco Use   Smoking status: Never   Smokeless tobacco: Never  Vaping Use   Vaping Use: Never used  Substance and Sexual Activity   Alcohol use: No    Alcohol/week: 0.0 standard drinks of alcohol   Drug use: No   Sexual activity: Not on file  Other Topics Concern   Not on file  Social History Narrative   Not on file   Social Determinants of Health    Financial Resource Strain: Not on file  Food Insecurity: Not on file  Transportation Needs: Not on file  Physical Activity: Not on file  Stress: Not on file  Social Connections: Not on file   Past Surgical History:  Procedure Laterality Date   [place]   closed hole in heart   CATARACT EXTRACTION Left 02/02/2016   LUMBAR [place]     Dr Eddie Dibbles   no colonoscopy     "scared"; [facility] reviewed   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; 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   Past Surgical History:  Procedure Laterality Date   [place]   closed hole in heart   CATARACT EXTRACTION Left 02/02/2016   LUMBAR DISC SURGERY     Dr Eddie Dibbles   no colonoscopy     "scared"; [facility] reviewed   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; 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   Past Medical History:  Diagnosis Date   Abnormality of gait    Brachial neuritis or radiculitis NOS    Cervicalgia    Facial nerve disorder    Hearing loss  Left ear   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   Hyperlipidemia    Hypertension    Macular degeneration    Myofascial pain    Pneumonia 2005   Thyroid disease    hyprothyroidism   Trigeminal neuralgia    BP (!) 158/81   Pulse 61   Ht '5\' 3"'$  (1.6 m)   Wt 163 lb 3.2 oz (74 kg)   SpO2 97%   BMI 28.91 kg/m   Opioid Risk Score:   Fall Risk Score:  `1  Depression screen PHQ 2/9     08/17/2021    8:22 AM 05/19/2021    9:00 AM 03/25/2021    3:38 PM 12/31/2020    2:53 PM 12/25/2019    9:14 AM 04/24/2018    9:44 AM 10/24/2017    9:47 AM  Depression screen PHQ 2/9  Decreased Interest 0 0 0 0 0 0 1  Down, Depressed, Hopeless 0 0 0 0 0 0 1  PHQ - 2 Score 0 0 0 0 0 0 2     Review of Systems  Constitutional: Negative.   HENT: Negative.    Eyes: Negative.   Respiratory: Negative.    Cardiovascular: Negative.   Gastrointestinal: Negative.   Endocrine: Negative.    Genitourinary: Negative.   Musculoskeletal: Negative.   Skin: Negative.   Allergic/Immunologic: Negative.   Neurological: Negative.   Hematological: Negative.   Psychiatric/Behavioral: Negative.        Objective:   Physical Exam Vitals and nursing note reviewed.  Constitutional:      Appearance: Normal appearance.  Cardiovascular:     Rate and Rhythm: Normal rate and regular rhythm.     Pulses: Normal pulses.     Heart sounds: Normal heart sounds.  Pulmonary:     Effort: Pulmonary effort is normal.     Breath sounds: Normal breath sounds.  Musculoskeletal:     Cervical back: Normal range of motion and neck supple.     Comments: Normal Muscle Bulk and Muscle Testing Reveals:  Upper Extremities: Full ROM and Muscle Strength 5/5  Lumbar Paraspinal Tenderness: L-4-L-5 Lower Extremities: Full ROM and Muscle Strength 5/5 Arises from Table with ease Narrow Based  Gait     Skin:    General: Skin is warm and dry.  Neurological:     Mental Status: She is alert and oriented to person, place, and time.  Psychiatric:        Mood and Affect: Mood normal.        Behavior: Behavior normal.         Assessment & Plan:  1. History of vestibular schwannoma resulting in trigeminal neuralgia and left facial nerve injury./ Dizziness: Schedule for MRI w/WO Contrast. This was discussed with Dr Naaman Plummer, he agrees with plan.  08/17/2021. Refilled: Methadone 10 mg daily #30, second script sent for the following month and Nucunta 50 mg one tablet every 12 hours as needed #30.  We will continue the opioid monitoring program, this consists of regular clinic visits, examinations, urine drug screen, pill counts as well as use of [place] Controlled Substance Reporting system. A 12 month History has been reviewed on the [place] Controlled Substance Reporting System on 08/17/2021.  2. History of cervicalgia/ Cervical Radiculitis: No complaints today. Continue current medication regimen. .  Continue HEP and Continue to Monitor. 08/17/2021 3. 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 Continue to  monitor. 08/17/2021  4. Lumbar Radiculitis: Continue current medication Regimen. Continue Nucynta. 08/17/2021 5. Greater Trochanter Bursitis of Left Hip: No complaints today. Alternate with Ice and Heat Therapy. Continue to Monitor. 08/17/2021   F/U in 2 months

## 2021-10-14 ENCOUNTER — Encounter: Payer: Self-pay | Admitting: Physical Medicine & Rehabilitation

## 2021-10-14 ENCOUNTER — Encounter: Payer: Medicare Other | Attending: Physical Medicine & Rehabilitation | Admitting: Physical Medicine & Rehabilitation

## 2021-10-14 VITALS — BP 146/84 | HR 61 | Ht 63.0 in | Wt 166.4 lb

## 2021-10-14 DIAGNOSIS — M47816 Spondylosis without myelopathy or radiculopathy, lumbar region: Secondary | ICD-10-CM | POA: Diagnosis present

## 2021-10-14 DIAGNOSIS — G894 Chronic pain syndrome: Secondary | ICD-10-CM | POA: Diagnosis present

## 2021-10-14 DIAGNOSIS — D333 Benign neoplasm of cranial nerves: Secondary | ICD-10-CM | POA: Diagnosis present

## 2021-10-14 DIAGNOSIS — G500 Trigeminal neuralgia: Secondary | ICD-10-CM | POA: Insufficient documentation

## 2021-10-14 MED ORDER — TAPENTADOL HCL 50 MG PO TABS: 50.0000 mg | ORAL_TABLET | Freq: Two times a day (BID) | ORAL | 0 refills | Status: DC | PRN

## 2021-10-14 MED ORDER — METHADONE HCL 10 MG PO TABS: 10.0000 mg | ORAL_TABLET | Freq: Every day | ORAL | 0 refills | Status: DC

## 2021-10-14 NOTE — Patient Instructions (Signed)
PLEASE FEEL FREE TO CALL OUR OFFICE WITH ANY PROBLEMS OR QUESTIONS ([phone])      

## 2021-10-14 NOTE — Progress Notes (Signed)
Subjective:    Patient ID: [name], female    DOB: [birth year], 76 y.o.   MRN: [id number]  HPI  [name] is here in follow up of her chronic pain. She had some issues with her teeth this summer and required dental implants. Her pain levels are about the same.   She remains on methadone and nucynta for her pain control. She uses voltaren gel as well. She never started tegretol as the pharmacy didn't call her.   She feels like her balance is not as good as it once was although she hasn't fallen.   Pain Inventory Average Pain 7 Pain Right Now 5 My pain is sharp, burning, and tingling  In the last 24 hours, has pain interfered with the following? General activity 7 Relation with others 9 Enjoyment of life 8 What TIME of day is your pain at its worst? daytime and evening Sleep (in general) Good  Pain is worse with:  talking Pain improves with: rest, heat/ice, and medication Relief from Meds: 8  Family History  Problem Relation Age of Onset   Hypertension Mother    Transient ischemic attack Mother    Coronary artery disease Father        CHF   Coronary artery disease Sister    Macular degeneration Sister    Transient ischemic attack Maternal Grandmother    Diabetes Neg Hx    Cancer Neg Hx    Social History   Socioeconomic History   Marital status: Married    Spouse name: Not on file   Number of children: 1   Years of education: Not on file   Highest education level: Not on file  Occupational History   Occupation: Retired  Tobacco Use   Smoking status: Never   Smokeless tobacco: Never  Vaping Use   Vaping Use: Never used  Substance and Sexual Activity   Alcohol use: No    Alcohol/week: 0.0 standard drinks of alcohol   Drug use: No   Sexual activity: Not on file  Other Topics Concern   Not on file  Social History Narrative   Not on file   Social Determinants of Health   Financial Resource Strain: Not on file  Food Insecurity: Not on file   Transportation Needs: Not on file  Physical Activity: Not on file  Stress: Not on file  Social Connections: Not on file   Past Surgical History:  Procedure Laterality Date   [place]   closed hole in heart   CATARACT EXTRACTION Left 02/02/2016   LUMBAR [place]     Dr Eddie Dibbles   no colonoscopy     "scared"; [facility] reviewed   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; 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   Past Surgical History:  Procedure Laterality Date   [place]   closed hole in heart   CATARACT EXTRACTION Left 02/02/2016   LUMBAR [place] SURGERY     Dr Eddie Dibbles   no colonoscopy     "scared"; [facility] reviewed   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; 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   Past Medical History:  Diagnosis Date   Abnormality of gait    Brachial neuritis or radiculitis NOS    Cervicalgia    Facial nerve disorder    Hearing loss    Left ear  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   Hyperlipidemia    Hypertension    Macular degeneration    Myofascial pain    Pneumonia 2005   Thyroid disease    hyprothyroidism   Trigeminal neuralgia    BP (!) 146/84   Pulse 61   Ht '5\' 3"'$  (1.6 m)   Wt 166 lb 6.4 oz (75.5 kg)   SpO2 96%   BMI 29.48 kg/m   Opioid Risk Score:   Fall Risk Score:  `1  Depression screen [facility] 2/9     10/14/2021    9:02 AM 08/17/2021    8:22 AM 05/19/2021    9:00 AM 03/25/2021    3:38 PM 12/31/2020    2:53 PM 12/25/2019    9:14 AM 04/24/2018    9:44 AM  Depression screen PHQ 2/9  Decreased Interest 0 0 0 0 0 0 0  Down, Depressed, Hopeless 0 0 0 0 0 0 0  PHQ - 2 Score 0 0 0 0 0 0 0     Review of Systems  Constitutional: Negative.   HENT: Negative.    Eyes: Negative.   Respiratory: Negative.    Cardiovascular: Negative.   Gastrointestinal: Negative.   Endocrine: Negative.   Genitourinary: Negative.   Musculoskeletal: Negative.   Skin: Negative.    Allergic/Immunologic: Negative.   Neurological: Negative.   Hematological: Negative.   Psychiatric/Behavioral: Negative.        Objective:   Physical Exam  General: No acute distress HEENT: NCAT, EOMI, oral membranes moist Cards: reg rate  Chest: normal effort Abdomen: Soft, NT, ND Skin: dry, intact Extremities: no edema Psych: pleasant and appropriate  Neuro: left C7, sensory loss to left face. Ongoing left facial weakness. Strength is 5 out of 5 in both lower extremities with normal sensory exam.   Musculoskeletal: low back with some tenderness with sit to stand.     Assessment & Plan:  ASSESSMENT: 1. History of vestibular schwannoma 2005 resulting in trigeminal neuralgia and left facial nerve injury- no tumor regrowth per MRI--has chronic peri-operative scarring and atrophy which accounts for weakness, pain, and balance issues.             -neurologically stable.is having some further atrophy of her left facial muscles.  2. History of cervicalgia and lumbar spine surgery---recent low back strain? 3. Chronic pain related to CN 5 involvement 4. Hypothyroid on supplementation 5. Myofascial pain left neck/shoulder.  improved   6. Reactive depression and anxiety   7.  Low back pain with history of previous lumbar surgery.  Symptoms have been progressing   PLAN: 1. Methadone 10 mg daily, #30 refilled.   Refilled today     Nucynta 50 mg 1 p.o. q.12 h. p.r.n. 30.    Refilled today We will continue the controlled substance monitoring program, this consists of regular clinic visits, examinations, routine drug screening, pill counts as well as use of [place] Controlled Substance Reporting System. [place] was reviewed today.  .   Medication was refilled and a second prescription was sent to the patient's pharmacy for next month.  .     2. HEP for back  3. we discussed safety awareness moving forward as it pertains to her balance.  4. continue with voltaren gel, topicals.   5.  eye mgt per ENT 6. Will try again with tegretol , '100mg'$  qhs for a week and then bid             -  she isn't interested in e-stim     15 minutes of face to face patient care time were spent during this visit. All questions were encouraged and answered.  Follow up with me in 2 months  .

## 2021-11-05 ENCOUNTER — Encounter (INDEPENDENT_AMBULATORY_CARE_PROVIDER_SITE_OTHER): Payer: Medicare Other | Admitting: Ophthalmology

## 2021-11-05 DIAGNOSIS — I10 Essential (primary) hypertension: Secondary | ICD-10-CM | POA: Diagnosis not present

## 2021-11-05 DIAGNOSIS — H353132 Nonexudative age-related macular degeneration, bilateral, intermediate dry stage: Secondary | ICD-10-CM

## 2021-11-05 DIAGNOSIS — H35033 Hypertensive retinopathy, bilateral: Secondary | ICD-10-CM

## 2021-11-05 DIAGNOSIS — H43813 Vitreous degeneration, bilateral: Secondary | ICD-10-CM

## 2021-11-30 ENCOUNTER — Other Ambulatory Visit: Payer: Self-pay | Admitting: Family Medicine

## 2021-11-30 DIAGNOSIS — H9222 Otorrhagia, left ear: Secondary | ICD-10-CM

## 2021-12-09 ENCOUNTER — Encounter: Payer: Self-pay | Admitting: Registered Nurse

## 2021-12-09 ENCOUNTER — Encounter: Payer: Medicare Other | Attending: Physical Medicine & Rehabilitation | Admitting: Registered Nurse

## 2021-12-09 VITALS — BP 148/77 | HR 86 | Ht 63.0 in | Wt 163.0 lb

## 2021-12-09 DIAGNOSIS — Z5181 Encounter for therapeutic drug level monitoring: Secondary | ICD-10-CM | POA: Diagnosis present

## 2021-12-09 DIAGNOSIS — M47816 Spondylosis without myelopathy or radiculopathy, lumbar region: Secondary | ICD-10-CM | POA: Insufficient documentation

## 2021-12-09 DIAGNOSIS — D333 Benign neoplasm of cranial nerves: Secondary | ICD-10-CM | POA: Insufficient documentation

## 2021-12-09 DIAGNOSIS — Z79891 Long term (current) use of opiate analgesic: Secondary | ICD-10-CM | POA: Insufficient documentation

## 2021-12-09 DIAGNOSIS — G894 Chronic pain syndrome: Secondary | ICD-10-CM | POA: Insufficient documentation

## 2021-12-09 DIAGNOSIS — G500 Trigeminal neuralgia: Secondary | ICD-10-CM | POA: Insufficient documentation

## 2021-12-09 MED ORDER — TAPENTADOL HCL 50 MG PO TABS: 50.0000 mg | ORAL_TABLET | Freq: Two times a day (BID) | ORAL | 0 refills | Status: DC | PRN

## 2021-12-09 MED ORDER — METHADONE HCL 10 MG PO TABS: 10.0000 mg | ORAL_TABLET | Freq: Every day | ORAL | 0 refills | Status: DC

## 2021-12-09 NOTE — Progress Notes (Signed)
Subjective:    Patient ID: [name], female    DOB: 1945-12-02, 76 y.o.   MRN: [id number]  HPI: [name] is a 76 y.o. female who returns for follow up appointment for chronic pain and medication refill. She states she has facial pain with tingling and burning sensation. She rates her pain 5. Her current exercise regime is walking and performing stretching exercises.  [name] Morphine equivalent is 67.00 MME.   Last UDS was Performed on 05/19/2021, it was consistent.     Pain Inventory Average Pain 7 Pain Right Now 5 My pain is burning, tingling, and aching  In the last 24 hours, has pain interfered with the following? General activity 8 Relation with others 9 Enjoyment of life 8 What TIME of day is your pain at its worst? morning , daytime, and evening Sleep (in general) Good  Pain is worse with:  Talking Pain improves with: rest, heat/ice, and medication Relief from Meds: 8  Family History  Problem Relation Age of Onset   Hypertension Mother    Transient ischemic attack Mother    Coronary artery disease Father        CHF   Coronary artery disease Sister    Macular degeneration Sister    Transient ischemic attack Maternal Grandmother    Diabetes Neg Hx    Cancer Neg Hx    Social History   Socioeconomic History   Marital status: Married    Spouse name: Not on file   Number of children: 1   Years of education: Not on file   Highest education level: Not on file  Occupational History   Occupation: Retired  Tobacco Use   Smoking status: Never   Smokeless tobacco: Never  Vaping Use   Vaping Use: Never used  Substance and Sexual Activity   Alcohol use: No    Alcohol/week: 0.0 standard drinks of alcohol   Drug use: No   Sexual activity: Not on file  Other Topics Concern   Not on file  Social History Narrative   Not on file   Social Determinants of Health   Financial Resource Strain: Not on file  Food Insecurity: Not on file   Transportation Needs: Not on file  Physical Activity: Not on file  Stress: Not on file  Social Connections: Not on file   Past Surgical History:  Procedure Laterality Date   [place]   closed hole in heart   CATARACT EXTRACTION Left 02/02/2016   LUMBAR [place]     Dr Eddie Dibbles   no colonoscopy     "scared"; 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  Opioid Risk Score:   Fall Risk Score:  `1  Depression screen [facility] 2/9     10/14/2021    9:02 AM 08/17/2021    8:22 AM 05/19/2021    9:00 AM 03/25/2021    3:38 PM 12/31/2020    2:53 PM 12/25/2019    9:14 AM 04/24/2018    9:44 AM  Depression screen PHQ 2/9  Decreased Interest 0 0 0 0 0 0 0  Down, Depressed, Hopeless 0 0 0 0 0 0 0  PHQ - 2 Score 0 0 0 0 0 0 0     Review of Systems  Constitutional: Negative.   Eyes: Negative.   Respiratory: Negative.    Cardiovascular: Negative.   Gastrointestinal: Negative.   Genitourinary: Negative.   Musculoskeletal:  Positive for back pain.  Skin: Negative.   Allergic/Immunologic: Negative.   Neurological:  Positive for numbness.  Hematological:  Negative.   Psychiatric/Behavioral: Negative.        Objective:   Physical Exam Vitals and nursing note reviewed.  Constitutional:      Appearance: Normal appearance.  Neck:     Comments: Cervical Paraspinal Tenderness: C-3-C-4 Mainly Right Side  Cardiovascular:     Rate and Rhythm: Normal rate and regular rhythm.     Pulses: Normal pulses.     Heart sounds: Normal heart sounds.  Pulmonary:     Effort: Pulmonary effort is normal.     Breath sounds: Normal breath sounds.  Musculoskeletal:     Cervical back: Normal range of motion and neck supple.     Comments: Normal Muscle Bulk and Muscle Testing Reveals:  Upper Extremities: Full ROM and Muscle Strength 5/5  Lumbar Paraspinal Tenderness: L-3-L-5  Lower Extremities: Full ROM and Muscle Strength 5/5 Arises from Table with Ease Narrow Based  Gait     Skin:    General: Skin is warm and dry.  Neurological:     Mental Status: She is alert and oriented to person, place, and time.  Psychiatric:        Mood and Affect: Mood normal.        Behavior: Behavior normal.         Assessment & Plan:  1. History of vestibular schwannoma resulting in trigeminal neuralgia and left facial nerve injury./ Dizziness: Schedule for MRI w/WO Contrast. This was discussed with Dr Naaman Plummer, he agrees with plan.  12/09/2021. Refilled: Methadone 10 mg daily #30, second script sent for the following month and Nucunta 50 mg one tablet every 12 hours as needed #30.  We will continue the opioid monitoring program, this consists of regular clinic visits, examinations, urine drug screen, pill counts as well as use of [place] Controlled Substance Reporting system. A 12 month History has been reviewed on the [place] on 12/09/2021.  2. History of cervicalgia/ Cervical Radiculitis: No complaints today. Continue current medication regimen. . Continue HEP and Continue to Monitor. 12/09/2021 3. Chronic pain related to  CN 5 involvement : Continue Voltaren Gel and current medication regimen. Continue to monitor. 12/09/2021  4. Lumbar Radiculitis: Continue current medication Regimen. Continue Nucynta. 12/09/2021 5. Greater Trochanter Bursitis of Left Hip: No complaints today. Alternate with Ice and  Heat Therapy. Continue to Monitor. 10/0252023   F/U in 2 months

## 2022-02-03 ENCOUNTER — Encounter: Payer: Self-pay | Admitting: Physical Medicine & Rehabilitation

## 2022-02-03 ENCOUNTER — Encounter: Payer: Medicare Other | Attending: Physical Medicine & Rehabilitation | Admitting: Physical Medicine & Rehabilitation

## 2022-02-03 DIAGNOSIS — G500 Trigeminal neuralgia: Secondary | ICD-10-CM

## 2022-02-03 DIAGNOSIS — M47816 Spondylosis without myelopathy or radiculopathy, lumbar region: Secondary | ICD-10-CM

## 2022-02-03 DIAGNOSIS — G894 Chronic pain syndrome: Secondary | ICD-10-CM

## 2022-02-03 MED ORDER — TAPENTADOL HCL 50 MG PO TABS: 50.0000 mg | ORAL_TABLET | Freq: Two times a day (BID) | ORAL | 0 refills | Status: DC | PRN

## 2022-02-03 MED ORDER — METHADONE HCL 10 MG PO TABS: 10.0000 mg | ORAL_TABLET | Freq: Every day | ORAL | 0 refills | Status: DC

## 2022-02-03 NOTE — Progress Notes (Signed)
Subjective:    Patient ID: [name], female    DOB: 09-Jul-1945, 76 y.o.   MRN: [id number]  HPI  [name] is here in follow up of her chronic pain related to her facial pain/vestibular schwannoma. She has experienced no changes. She always struggles with the cooler weather. Her medications including methadone and nucynta tend to help. She uses nucynta sparingly.   She has had more pain in he low back over the past year.. she mentioned a "strain" at her last visit. She has seen ortho in the past and had remote surgery.  pain is severe if she sits for a long period of time. Some radiation to right posterior thigh. Walking can cause pain as well. She's not stretching consistently. I reviewed her mri from 3/23 which really doesn't look too bad. There is moderate to mild facet and foraminal disease at L4-5 and L5-S1 particularly on the right. She hasn't had therapy. Apparently injections were mentioned before but she was not too keen on that idea.    Pain Inventory Average Pain 7 Pain Right Now 5 My pain is sharp, burning, stabbing, and tingling  In the last 24 hours, has pain interfered with the following? General activity 9 Relation with others 9 Enjoyment of life 10 What TIME of day is your pain at its worst? daytime and evening Sleep (in general) Good  Pain is worse with:  talking Pain improves with: rest, heat/ice, and medication Relief from Meds: 7  Family History  Problem Relation Age of Onset   Hypertension Mother    Transient ischemic attack Mother    Coronary artery disease Father        CHF   Coronary artery disease Sister    Macular degeneration Sister    Transient ischemic attack Maternal Grandmother    Diabetes Neg Hx    Cancer Neg Hx    Social History   Socioeconomic History   Marital status: Married    Spouse name: Not on file   Number of children: 1   Years of education: Not on file   Highest education level: Not on file  Occupational History    Occupation: Retired  Tobacco Use   Smoking status: Never   Smokeless tobacco: Never  Vaping Use   Vaping Use: Never used  Substance and Sexual Activity   Alcohol use: No    Alcohol/week: 0.0 standard drinks of alcohol   Drug use: No   Sexual activity: Not on file  Other Topics Concern   Not on file  Social History Narrative   Not on file   Social Determinants of Health   Financial Resource Strain: Not on file  Food Insecurity: Not on file  Transportation Needs: Not on file  Physical Activity: Not on file  Stress: Not on file  Social Connections: Not on file   Past Surgical History:  Procedure Laterality Date   [place]   closed hole in heart   CATARACT EXTRACTION Left 02/02/2016   LUMBAR [place]     Dr Eddie Dibbles   no colonoscopy     "scared"; [facility] reviewed   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; 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   Past Surgical History:  Procedure Laterality Date   [place]   closed hole in heart   CATARACT EXTRACTION Left 02/02/2016   LUMBAR [place]  Dr Eddie Dibbles   no colonoscopy     "scared"; [facility] reviewed   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; 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   Past Medical History:  Diagnosis Date   Abnormality of gait    Brachial neuritis or radiculitis NOS    Cervicalgia    Facial nerve disorder    Hearing loss    Left ear   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   Hyperlipidemia    Hypertension    Macular degeneration    Myofascial pain    Pneumonia 2005   Thyroid disease    hyprothyroidism   Trigeminal neuralgia    Ht '5\' 3"'$  (1.6 m)   Wt 167 lb (75.8 kg)   BMI 29.58 kg/m   Opioid Risk Score:   Fall Risk Score:  `1  Depression screen [facility] 2/9     02/03/2022    9:10 AM 10/14/2021    9:02 AM 08/17/2021    8:22 AM 05/19/2021    9:00 AM 03/25/2021    3:38 PM 12/31/2020    2:53 PM 12/25/2019    9:14 AM   Depression screen PHQ 2/9  Decreased Interest 0 0 0 0 0 0 0  Down, Depressed, Hopeless 0 0 0 0 0 0 0  PHQ - 2 Score 0 0 0 0 0 0 0      Review of Systems  Musculoskeletal:        Facial pain  All other systems reviewed and are negative.     Objective:   Physical Exam General: No acute distress HEENT: NCAT, EOMI, oral membranes moist Cards: reg rate  Chest: normal effort Abdomen: Soft, NT, ND Skin: dry, intact, old lumbar surgical scar Extremities: no edema Psych: pleasant and appropriate  Neuro: left C7, sensory loss to left face. Ongoing left facial weakness. Strength is 5 out of 5 in both lower extremities with normal sensory exam.   Musculoskeletal: low back with some tenderness with sit to stand. And with lateral bending to right, twisting to left and with extension/facet maneuever to right. Hamstrings are a little tight. SLR/SST are equivocal to negative. Posture fair.      Assessment & Plan:  ASSESSMENT: 1. History of vestibular schwannoma 2005 resulting in trigeminal neuralgia and left facial nerve injury- no tumor regrowth per MRI--has chronic peri-operative scarring and atrophy which accounts for weakness, pain, and balance issues.             -no changes 2. History of cervicalgia and lumbar spine surgery---she's had worsening low back pain this past year. I reviewed MRI which shows moderate disc disease and facet arthropathy most significant at right L4-5 foramen 3. Chronic pain related to CN 5 involvement 4. Hypothyroid on supplementation 5. Myofascial pain left neck/shoulder.  improved   6. Reactive depression and anxiety     PLAN: 1. Methadone 10 mg daily, #30 refilled.   Refilled today     Nucynta 50 mg 1 p.o. q.12 h. p.r.n. 30.    Refilled today We will continue the controlled substance monitoring program, this consists of regular clinic visits, examinations, routine drug screening, pill counts as well as use of [place] Controlled Substance Reporting  System. [place] was [place] today.    -second methadone for next month not needed 2. Discussed mechanics and posture.  Will potentially make referral to PT in February when I see her next. I don't see a clear need  for injections right now. 3.. continue with voltaren gel, topicals.   5. eye mgt per ENT 6. Never got tegretol filled             -not interested in e-stim     22 minutes of face to face patient care time were spent during this visit. All questions were encouraged and answered.  Follow up with me in 6 weeks  .

## 2022-02-03 NOTE — Patient Instructions (Signed)
ALWAYS FEEL FREE TO CALL OUR OFFICE WITH ANY PROBLEMS OR QUESTIONS ([phone])  **PLEASE NOTE** ALL MEDICATION REFILL REQUESTS (INCLUDING CONTROLLED SUBSTANCES) NEED TO BE MADE AT LEAST 7 DAYS PRIOR TO REFILL BEING DUE. ANY REFILL REQUESTS INSIDE THAT TIME FRAME MAY RESULT IN DELAYS IN RECEIVING YOUR PRESCRIPTION.                    

## 2022-03-23 ENCOUNTER — Telehealth: Payer: Self-pay | Admitting: Registered Nurse

## 2022-03-23 DIAGNOSIS — M47816 Spondylosis without myelopathy or radiculopathy, lumbar region: Secondary | ICD-10-CM

## 2022-03-23 DIAGNOSIS — G500 Trigeminal neuralgia: Secondary | ICD-10-CM

## 2022-03-23 NOTE — Telephone Encounter (Signed)
Husband called [name] is sick and unable to come to appt on 03/24/22. She need refills on her meds.

## 2022-03-24 ENCOUNTER — Encounter: Payer: Medicare Other | Admitting: Registered Nurse

## 2022-03-24 MED ORDER — METHADONE HCL 10 MG PO TABS: 10.0000 mg | ORAL_TABLET | Freq: Every day | ORAL | 0 refills | Status: DC

## 2022-03-24 NOTE — Telephone Encounter (Signed)
Mr. Kanady called office, reporting his wife is ill. Her appointment was re-scheduled.  PMP was reviewed, and Methadone e-scribed today.

## 2022-03-24 NOTE — Addendum Note (Signed)
Addended by: Bayard Hugger on: 03/24/2022 10:21 AM   Modules accepted: Orders

## 2022-04-05 ENCOUNTER — Telehealth: Payer: Self-pay | Admitting: Physical Medicine & Rehabilitation

## 2022-04-05 DIAGNOSIS — G500 Trigeminal neuralgia: Secondary | ICD-10-CM

## 2022-04-05 DIAGNOSIS — G894 Chronic pain syndrome: Secondary | ICD-10-CM

## 2022-04-05 NOTE — Telephone Encounter (Signed)
Patient appt was moved to 04/20/22 due to [place] out sick. She needs her Nucynta refill sent in.

## 2022-04-06 ENCOUNTER — Encounter: Payer: Medicare Other | Admitting: Registered Nurse

## 2022-04-06 MED ORDER — TAPENTADOL HCL 50 MG PO TABS: 50.0000 mg | ORAL_TABLET | Freq: Two times a day (BID) | ORAL | 0 refills | Status: DC | PRN

## 2022-04-06 NOTE — Telephone Encounter (Signed)
Pt requesting refill on Nucynta: appt with Zella Ball had to be rescheduled.  Dose: 50 mg Route: Oral Frequency: Every 12 hours PRN  Dispense Quantity: 30 tablet Refills: 0   Note to Pharmacy: Do Not Fill Before 01/12/22  Indications of Use: Pain       Sig: Take 1 tablet (50 mg total) by mouth every 12 (twelve) hours as needed.       Start Date: 02/03/22 End Date: --  Written Date: 02/03/22 Expiration Date: 08/02/22  Earliest Fill Date: 02/03/22

## 2022-04-06 NOTE — Telephone Encounter (Signed)
Rx written and sent to the pharmacy. Thanks!

## 2022-04-20 ENCOUNTER — Encounter: Payer: Self-pay | Admitting: Registered Nurse

## 2022-04-20 ENCOUNTER — Encounter: Payer: Medicare Other | Attending: Physical Medicine & Rehabilitation | Admitting: Registered Nurse

## 2022-04-20 VITALS — BP 146/81 | HR 72 | Ht 63.0 in | Wt 165.0 lb

## 2022-04-20 DIAGNOSIS — Z5181 Encounter for therapeutic drug level monitoring: Secondary | ICD-10-CM | POA: Insufficient documentation

## 2022-04-20 DIAGNOSIS — Z79891 Long term (current) use of opiate analgesic: Secondary | ICD-10-CM | POA: Diagnosis not present

## 2022-04-20 DIAGNOSIS — G500 Trigeminal neuralgia: Secondary | ICD-10-CM | POA: Diagnosis not present

## 2022-04-20 DIAGNOSIS — M47816 Spondylosis without myelopathy or radiculopathy, lumbar region: Secondary | ICD-10-CM | POA: Diagnosis present

## 2022-04-20 DIAGNOSIS — G894 Chronic pain syndrome: Secondary | ICD-10-CM | POA: Diagnosis not present

## 2022-04-20 MED ORDER — METHADONE HCL 10 MG PO TABS: 10.0000 mg | ORAL_TABLET | Freq: Every day | ORAL | 0 refills | Status: DC

## 2022-04-20 MED ORDER — TAPENTADOL HCL 50 MG PO TABS: 50.0000 mg | ORAL_TABLET | Freq: Two times a day (BID) | ORAL | 0 refills | Status: DC | PRN

## 2022-04-20 NOTE — Progress Notes (Signed)
Subjective:    Patient ID: [name], female    DOB: 01-24-1946, 77 y.o.   MRN: [id number]  HPI: [name] is a 77 y.o. female who returns for follow up appointment for chronic pain and medication refill. She states her pain is located in her lower back and reports left facial pain. She rates her pain 6. Her current exercise regime is walking and performing stretching exercises.  Ms. Holness Morphine equivalent is 87.00 MME.   UDS ordered    Pain Inventory Average Pain 8 Pain Right Now 6 My pain is constant, sharp, burning, and tingling  In the last 24 hours, has pain interfered with the following? General activity 7 Relation with others 9 Enjoyment of life 10 What TIME of day is your pain at its worst? daytime and evening Sleep (in general) Good  Pain is worse with:  talking Pain improves with: heat/ice and medication Relief from Meds: 8  Family History  Problem Relation Age of Onset   Hypertension Mother    Transient ischemic attack Mother    Coronary artery disease Father        CHF   Coronary artery disease Sister    Macular degeneration Sister    Transient ischemic attack Maternal Grandmother    Diabetes Neg Hx    Cancer Neg Hx    Social History   Socioeconomic History   Marital status: Married    Spouse name: Not on file   Number of children: 1   Years of education: Not on file   Highest education level: Not on file  Occupational History   Occupation: Retired  Tobacco Use   Smoking status: Never   Smokeless tobacco: Never  Vaping Use   Vaping Use: Never used  Substance and Sexual Activity   Alcohol use: No    Alcohol/week: 0.0 standard drinks of alcohol   Drug use: No   Sexual activity: Not on file  Other Topics Concern   Not on file  Social History Narrative   Not on file   Social Determinants of Health   Financial Resource Strain: Not on file  Food Insecurity: Not on file  Transportation Needs: Not on file  Physical  Activity: Not on file  Stress: Not on file  Social Connections: Not on file   Past Surgical History:  Procedure Laterality Date   [place]   closed hole in heart   CATARACT EXTRACTION Left 02/02/2016   LUMBAR DISC SURGERY     Dr Eddie Dibbles   no colonoscopy     "scared"; [facility] reviewed   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; 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   Past Surgical History:  Procedure Laterality Date   [place]   closed hole in heart   CATARACT EXTRACTION Left 02/02/2016   LUMBAR DISC SURGERY     Dr Eddie Dibbles   no colonoscopy     "scared"; [facility] reviewed   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  Opioid Risk Score:   Fall Risk Score:  `1  Depression screen [facility] 2/9     02/03/2022    9:10 AM 10/14/2021    9:02 AM 08/17/2021    8:22 AM 05/19/2021    9:00 AM 03/25/2021    3:38 PM 12/31/2020    2:53 PM 12/25/2019    9:14 AM  Depression screen PHQ 2/9  Decreased Interest 0 0 0 0 0 0 0  Down, Depressed, Hopeless 0 0 0 0 0 0 0  PHQ - 2 Score 0 0 0 0 0 0 0    Review of Systems  Skin:        Left side of mouth / jaw pain  All other systems reviewed and are negative.      Objective:   Physical Exam Vitals and nursing note reviewed.  Constitutional:      Appearance: Normal appearance.  Cardiovascular:     Rate and Rhythm: Normal rate and regular rhythm.     Pulses: Normal pulses.     Heart sounds: Normal heart sounds.  Pulmonary:      Effort: Pulmonary effort is normal.     Breath sounds: Normal breath sounds.  Musculoskeletal:     Cervical back: Normal range of motion and neck supple.     Comments: Normal Muscle Bulk and Muscle Testing Reveals:  Upper Extremities: Full ROM and Muscle Strength 5/5 Lumbar Paraspinal Tenderness: L-4-L-5 Lower Extremities: Full ROM and Muscle Strength 5/5 Arises from Table slowly Narrow Based  Gait     Skin:    General: Skin is warm and dry.  Neurological:     Mental Status: She is alert and oriented to person, place, and time.  Psychiatric:        Mood and Affect: Mood normal.        Behavior: Behavior normal.         Assessment & Plan:  1. History of vestibular schwannoma resulting in trigeminal neuralgia and left facial nerve injury./ Dizziness: Continue Tapentadol. Continue to monitor  04/20/2022. Refilled: Methadone 10 mg daily #30, second script sent for the following month and Nucunta 50 mg one tablet every 12 hours as needed #30.  We will continue the opioid monitoring program, this consists of regular clinic visits, examinations, urine drug screen, pill counts as well as use of [place] Controlled Substance Reporting system. A 12 month History has been reviewed on the [place] on 04/20/2022.  2. History of cervicalgia/ Cervical Radiculitis: No complaints today. Continue current medication regimen. Continue HEP and Continue to Monitor. 04/20/2022 3. 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 Continue to monitor. 04/20/2022  4. Lumbar Radiculitis: Continue current medication Regimen. Continue Nucynta. 04/20/2022 5. Greater Trochanter Bursitis of Left Hip: No complaints today. Alternate with Ice and Heat Therapy. Continue to Monitor. 03/052024   F/U in 2 months

## 2022-04-22 LAB — TOXASSURE SELECT,+ANTIDEPR,UR

## 2022-05-05 ENCOUNTER — Telehealth: Payer: Self-pay | Admitting: *Deleted

## 2022-05-05 NOTE — Telephone Encounter (Signed)
U[place] drug screen for this encounter is consistent for prescribed medication 

## 2022-05-14 LAB — LAB REPORT - SCANNED
A1c: 6.1
EGFR: 71

## 2022-06-16 ENCOUNTER — Encounter: Payer: Self-pay | Admitting: Registered Nurse

## 2022-06-16 ENCOUNTER — Encounter: Payer: Medicare Other | Attending: Physical Medicine & Rehabilitation | Admitting: Registered Nurse

## 2022-06-16 VITALS — BP 133/62 | HR 94 | Ht 63.0 in | Wt 167.0 lb

## 2022-06-16 DIAGNOSIS — Z79891 Long term (current) use of opiate analgesic: Secondary | ICD-10-CM

## 2022-06-16 DIAGNOSIS — G894 Chronic pain syndrome: Secondary | ICD-10-CM

## 2022-06-16 DIAGNOSIS — Z5181 Encounter for therapeutic drug level monitoring: Secondary | ICD-10-CM | POA: Diagnosis not present

## 2022-06-16 DIAGNOSIS — G500 Trigeminal neuralgia: Secondary | ICD-10-CM | POA: Diagnosis not present

## 2022-06-16 DIAGNOSIS — M47816 Spondylosis without myelopathy or radiculopathy, lumbar region: Secondary | ICD-10-CM | POA: Diagnosis not present

## 2022-06-16 MED ORDER — METHADONE HCL 10 MG PO TABS: 10.0000 mg | ORAL_TABLET | Freq: Every day | ORAL | 0 refills | Status: DC

## 2022-06-16 MED ORDER — TAPENTADOL HCL 50 MG PO TABS: 50.0000 mg | ORAL_TABLET | Freq: Two times a day (BID) | ORAL | 0 refills | Status: DC | PRN

## 2022-06-16 NOTE — Progress Notes (Signed)
Subjective:    Patient ID: [name], female    DOB: 1945/11/09, 77 y.o.   MRN: [id number]  HPI: [name] is a 77 y.o. female who returns for follow up appointment for chronic pain and medication refill. She  reports left facial pain and lower back pain. She rates her pain 5. Her current exercise regime is walking and performing stretching exercises.  Ms. Krenzer Morphine equivalent is 47.00 MME.   Last UDS was Performed on 04/20/2022, it was consistent.    Pain Inventory Average Pain 7 Pain Right Now 5 My pain is constant, burning, stabbing, tingling, and aching  In the last 24 hours, has pain interfered with the following? General activity 7 Relation with others 9 Enjoyment of life 9 What TIME of day is your pain at its worst? daytime and evening Sleep (in general) Good  Pain is worse with:  talking Pain improves with: rest, heat/ice, and medication Relief from Meds: 9  Family History  Problem Relation Age of Onset   Hypertension Mother    Transient ischemic attack Mother    Coronary artery disease Father        CHF   Coronary artery disease Sister    Macular degeneration Sister    Transient ischemic attack Maternal Grandmother    Diabetes Neg Hx    Cancer Neg Hx    Social History   Socioeconomic History   Marital status: Married    Spouse name: Not on file   Number of children: 1   Years of education: Not on file   Highest education level: Not on file  Occupational History   Occupation: Retired  Tobacco Use   Smoking status: Never   Smokeless tobacco: Never  Vaping Use   Vaping Use: Never used  Substance and Sexual Activity   Alcohol use: No    Alcohol/week: 0.0 standard drinks of alcohol   Drug use: No   Sexual activity: Not on file  Other Topics Concern   Not on file  Social History Narrative   Not on file   Social Determinants of Health   Financial Resource Strain: Not on file  Food Insecurity: Not on file  Transportation  Needs: Not on file  Physical Activity: Not on file  Stress: Not on file  Social Connections: Not on file   Past Surgical History:  Procedure Laterality Date   ABDOMINAL HYSTERECTOMY     CARDIAC SURGERY  1955   closed hole in heart   CATARACT EXTRACTION Left 02/02/2016   LUMBAR DISC SURGERY     Dr Renae Fickle   no colonoscopy     "scared"; [facility] reviewed   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; 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   Past Surgical History:  Procedure Laterality Date   ABDOMINAL HYSTERECTOMY     CARDIAC SURGERY  1955   closed hole in heart   CATARACT EXTRACTION Left 02/02/2016   LUMBAR DISC SURGERY     Dr Renae Fickle   no colonoscopy     "scared"; [facility] reviewed   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; 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   Past Medical History:  Diagnosis Date   Abnormality of gait    Brachial neuritis or radiculitis NOS    Cervicalgia    Facial nerve disorder    Hearing loss    Left ear   History of brain tumor 2005   at base of brain  stem   Hyperlipidemia    Hypertension    Macular degeneration    Myofascial pain    Pneumonia 2005   Thyroid disease    hyprothyroidism   Trigeminal neuralgia    BP 133/62   Pulse 94   Ht 5\' 3"  (1.6 m)   Wt 167 lb (75.8 kg)   SpO2 97%   BMI 29.58 kg/m   Opioid Risk Score:   Fall Risk Score:  `1  Depression screen [facility] 2/9     06/16/2022    9:34 AM 04/20/2022    9:19 AM 02/03/2022    9:10 AM 10/14/2021    9:02 AM 08/17/2021    8:22 AM 05/19/2021    9:00 AM 03/25/2021    3:38 PM  Depression screen PHQ 2/9  Decreased Interest 0 0 0 0 0 0 0  Down, Depressed, Hopeless 0 0 0 0 0 0 0  PHQ - 2 Score 0 0 0 0 0 0 0    Review of Systems  Neurological:        Right side facial pain  All other systems reviewed and are negative.      Objective:   Physical Exam Vitals and nursing note reviewed.  Constitutional:      Appearance: Normal appearance.  Cardiovascular:     Rate and Rhythm: Normal rate and regular  rhythm.     Pulses: Normal pulses.     Heart sounds: Normal heart sounds.  Pulmonary:     Effort: Pulmonary effort is normal.     Breath sounds: Normal breath sounds.  Musculoskeletal:     Cervical back: Normal range of motion and neck supple.     Comments: Normal Muscle Bulk and Muscle Testing Reveals:  Upper Extremities: Full ROM and Muscle Strength 5/5 Lumbar Paraspinal Tenderness: L-4-l-5 Lower Extremities: Full ROM and Muscle Strength 5/5 Arises from Table with ease Narrow Based  Gait     Skin:    General: Skin is warm and dry.  Neurological:     Mental Status: She is alert and oriented to person, place, and time.  Psychiatric:        Mood and Affect: Mood normal.        Behavior: Behavior normal.         Assessment & Plan:  1. History of vestibular schwannoma resulting in trigeminal neuralgia and left facial nerve injury./ Dizziness: Continue Tapentadol. Continue to monitor  06/16/2022. Refilled: Methadone 10 mg daily #30, second script sent for the following month and Nucunta 50 mg one tablet every 12 hours as needed #30.  We will continue the opioid monitoring program, this consists of regular clinic visits, examinations, urine drug screen, pill counts as well as use of [place] Controlled Substance Reporting system. A 12 month History has been reviewed on the [place] Controlled Substance Reporting System on 06/16/2022.  2. History of cervicalgia/ Cervical Radiculitis: No complaints today. Continue current medication regimen. Continue HEP and Continue to Monitor. 06/16/2022 3. 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 Continue to monitor. 06/16/2022  4. Lumbar Radiculitis: Continue current medication Regimen. Continue Nucynta. 06/16/2022 5. Greater Trochanter Bursitis of Left Hip: No complaints today. Alternate with Ice and Heat Therapy. Continue to Monitor. 05/012024   F/U in 2 months

## 2022-08-16 ENCOUNTER — Encounter: Payer: Medicare Other | Attending: Physical Medicine & Rehabilitation | Admitting: Registered Nurse

## 2022-08-16 ENCOUNTER — Encounter: Payer: Self-pay | Admitting: Registered Nurse

## 2022-08-16 VITALS — BP 150/81 | HR 71 | Ht 63.0 in | Wt 169.0 lb

## 2022-08-16 DIAGNOSIS — G894 Chronic pain syndrome: Secondary | ICD-10-CM | POA: Insufficient documentation

## 2022-08-16 DIAGNOSIS — I10 Essential (primary) hypertension: Secondary | ICD-10-CM | POA: Insufficient documentation

## 2022-08-16 DIAGNOSIS — G500 Trigeminal neuralgia: Secondary | ICD-10-CM | POA: Insufficient documentation

## 2022-08-16 DIAGNOSIS — Z79891 Long term (current) use of opiate analgesic: Secondary | ICD-10-CM | POA: Insufficient documentation

## 2022-08-16 DIAGNOSIS — M47816 Spondylosis without myelopathy or radiculopathy, lumbar region: Secondary | ICD-10-CM | POA: Diagnosis present

## 2022-08-16 DIAGNOSIS — D333 Benign neoplasm of cranial nerves: Secondary | ICD-10-CM | POA: Insufficient documentation

## 2022-08-16 DIAGNOSIS — Z5181 Encounter for therapeutic drug level monitoring: Secondary | ICD-10-CM | POA: Diagnosis present

## 2022-08-16 MED ORDER — METHADONE HCL 10 MG PO TABS
10.0000 mg | ORAL_TABLET | Freq: Every day | ORAL | 0 refills | Status: DC
Start: 2022-08-16 — End: 2022-08-16

## 2022-08-16 MED ORDER — TAPENTADOL HCL 50 MG PO TABS
50.0000 mg | ORAL_TABLET | Freq: Two times a day (BID) | ORAL | 0 refills | Status: DC | PRN
Start: 2022-08-16 — End: 2022-10-19

## 2022-08-16 MED ORDER — METHADONE HCL 10 MG PO TABS
10.0000 mg | ORAL_TABLET | Freq: Every day | ORAL | 0 refills | Status: DC
Start: 2022-08-16 — End: 2022-10-19

## 2022-08-16 NOTE — Progress Notes (Unsigned)
Subjective:    Patient ID: [name], female    DOB: 1945-09-29, 77 y.o.   MRN: [id number]  HPI: [name] is a 77 y.o. female who returns for follow up appointment for chronic pain and medication refill. states *** pain is located in  ***. rates pain ***. current exercise regime is walking and performing stretching exercises.  [name] Morphine equivalent is *** MME.   Last UDS was Performed on 04/20/2022, it was consistent.      Pain Inventory Average Pain 7 Pain Right Now 5 My pain is constant, burning, stabbing, and tingling  In the last 24 hours, has pain interfered with the following? General activity 7 Relation with others 9 Enjoyment of life 9 What TIME of day is your pain at its worst? daytime and evening Sleep (in general) Good  Pain is worse with:  too much talking  Pain improves with: rest, heat/ice, and medication Relief from Meds: 8  Family History  Problem Relation Age of Onset   Hypertension Mother    Transient ischemic attack Mother    Coronary artery disease Father        CHF   Coronary artery disease Sister    Macular degeneration Sister    Transient ischemic attack Maternal Grandmother    Diabetes Neg Hx    Cancer Neg Hx    Social History   Socioeconomic History   Marital status: Married    Spouse name: Not on file   Number of children: 1   Years of education: Not on file   Highest education level: Not on file  Occupational History   Occupation: Retired  Tobacco Use   Smoking status: Never   Smokeless tobacco: Never  Vaping Use   Vaping Use: Never used  Substance and Sexual Activity   Alcohol use: No    Alcohol/week: 0.0 standard drinks of alcohol   Drug use: No   Sexual activity: Not on file  Other Topics Concern   Not on file  Social History Narrative   Not on file   Social Determinants of Health   Financial Resource Strain: Not on file  Food Insecurity: Not on file  Transportation Needs: Not on file   Physical Activity: Not on file  Stress: Not on file  Social Connections: Not on file   Past Surgical History:  Procedure Laterality Date   ABDOMINAL HYSTERECTOMY     CARDIAC SURGERY  1955   closed hole in heart   CATARACT EXTRACTION Left 02/02/2016   LUMBAR DISC SURGERY     Dr Renae Fickle   no colonoscopy     "scared"; [facility] reviewed   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; 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   Past Surgical History:  Procedure Laterality Date   ABDOMINAL HYSTERECTOMY     CARDIAC SURGERY  1955   closed hole in heart   CATARACT EXTRACTION Left 02/02/2016   LUMBAR DISC SURGERY     Dr Renae Fickle   no colonoscopy     "scared"; [facility] reviewed   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  Opioid Risk Score:   Fall Risk Score:  `1  Depression screen [facility] 2/9     06/16/2022    9:34 AM 04/20/2022    9:19 AM 02/03/2022    9:10 AM 10/14/2021    9:02 AM 08/17/2021    8:22 AM 05/19/2021    9:00 AM 03/25/2021    3:38 PM  Depression screen PHQ 2/9  Decreased Interest 0 0 0 0 0 0 0  Down, Depressed, Hopeless 0 0 0 0 0 0 0  PHQ - 2 Score 0 0 0 0 0 0 0    Review of Systems  Neurological:        Left -side of face pain  All other systems reviewed and are negative.     Objective:   Physical Exam        Assessment & Plan:  1. History of vestibular schwannoma resulting in trigeminal neuralgia and left facial nerve injury./ Dizziness: Continue Tapentadol. Continue to monitor  06/16/2022. Refilled: Methadone 10 mg daily  #30, second script sent for the following month and Nucunta 50 mg one tablet every 12 hours as needed #30.  We will continue the opioid monitoring program, this consists of regular clinic visits, examinations, urine drug screen, pill counts as well as use of [place] Controlled Substance Reporting system. A 12 month History has been reviewed on the [place] Controlled Substance Reporting System on 06/16/2022.  2. History of cervicalgia/ Cervical Radiculitis: No complaints today. Continue current medication regimen. Continue HEP and Continue to Monitor. 06/16/2022 3. 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 Continue to monitor. 06/16/2022  4. Lumbar Radiculitis: Continue current medication Regimen. Continue Nucynta. 06/16/2022 5. Greater Trochanter Bursitis of Left Hip: No complaints today. Alternate with Ice and Heat Therapy. Continue to Monitor. 05/012024   F/U in 2 months

## 2022-08-25 ENCOUNTER — Ambulatory Visit: Payer: Medicare Other | Admitting: Physical Medicine & Rehabilitation

## 2022-10-19 ENCOUNTER — Encounter: Payer: Self-pay | Admitting: Registered Nurse

## 2022-10-19 ENCOUNTER — Encounter: Payer: Medicare Other | Attending: Physical Medicine & Rehabilitation | Admitting: Registered Nurse

## 2022-10-19 VITALS — Ht 63.0 in | Wt 169.0 lb

## 2022-10-19 DIAGNOSIS — Z5181 Encounter for therapeutic drug level monitoring: Secondary | ICD-10-CM | POA: Insufficient documentation

## 2022-10-19 DIAGNOSIS — Z79891 Long term (current) use of opiate analgesic: Secondary | ICD-10-CM | POA: Insufficient documentation

## 2022-10-19 DIAGNOSIS — G500 Trigeminal neuralgia: Secondary | ICD-10-CM | POA: Insufficient documentation

## 2022-10-19 DIAGNOSIS — M47816 Spondylosis without myelopathy or radiculopathy, lumbar region: Secondary | ICD-10-CM | POA: Diagnosis present

## 2022-10-19 DIAGNOSIS — G894 Chronic pain syndrome: Secondary | ICD-10-CM | POA: Insufficient documentation

## 2022-10-19 DIAGNOSIS — D333 Benign neoplasm of cranial nerves: Secondary | ICD-10-CM | POA: Insufficient documentation

## 2022-10-19 MED ORDER — METHADONE HCL 10 MG PO TABS
10.0000 mg | ORAL_TABLET | Freq: Every day | ORAL | 0 refills | Status: DC
Start: 2022-10-19 — End: 2022-10-19

## 2022-10-19 MED ORDER — TAPENTADOL HCL 50 MG PO TABS
50.0000 mg | ORAL_TABLET | Freq: Two times a day (BID) | ORAL | 0 refills | Status: DC | PRN
Start: 2022-10-19 — End: 2022-10-19

## 2022-10-19 MED ORDER — METHADONE HCL 10 MG PO TABS
10.0000 mg | ORAL_TABLET | Freq: Every day | ORAL | 0 refills | Status: DC
Start: 2022-10-19 — End: 2022-12-20

## 2022-10-19 MED ORDER — TAPENTADOL HCL 50 MG PO TABS
50.0000 mg | ORAL_TABLET | Freq: Two times a day (BID) | ORAL | 0 refills | Status: DC | PRN
Start: 2022-10-19 — End: 2022-12-20

## 2022-10-19 NOTE — Progress Notes (Signed)
Subjective:    Patient ID: [name], female    DOB: May 07, 1945, 77 y.o.   MRN: [id number]  HPI: [name] is a 77 y.o. female who returns for follow up appointment for chronic pain and medication refill. She states her pain is located in her lower back and reports left facial pain..Ms. Hosford reports increase intensity of facial pain, she is taking her Nucynta once a day due to cost, she is thinking about seeing a neurosurgeon, she will call office with doctors she would like or this provider to send referral to, she verbalizes understanding. She rates her pain 6. Her current exercise regime is walking and performing stretching exercises.  Ms. Courtney Morphine equivalent is 80.00 MME.   UDS Ordered Today.    Vitals: 146/75 P 65 O2 Sats 96%  Pain Inventory Average Pain 6 Pain Right Now 6 My pain is sharp, burning, and tingling  In the last 24 hours, has pain interfered with the following? General activity 8 Relation with others 9 Enjoyment of life 9 What TIME of day is your pain at its worst? daytime and evening Sleep (in general) Good  Pain is worse with:  talking Pain improves with: heat/ice Relief from Meds: 5  Family History  Problem Relation Age of Onset   Hypertension Mother    Transient ischemic attack Mother    Coronary artery disease Father        CHF   Coronary artery disease Sister    Macular degeneration Sister    Transient ischemic attack Maternal Grandmother    Diabetes Neg Hx    Cancer Neg Hx    Social History   Socioeconomic History   Marital status: Married    Spouse name: Not on file   Number of children: 1   Years of education: Not on file   Highest education level: Not on file  Occupational History   Occupation: Retired  Tobacco Use   Smoking status: Never   Smokeless tobacco: Never  Vaping Use   Vaping status: Never Used  Substance and Sexual Activity   Alcohol use: No    Alcohol/week: 0.0 standard drinks of alcohol    Drug use: No   Sexual activity: Not on file  Other Topics Concern   Not on file  Social History Narrative   Not on file   Social Determinants of Health   Financial Resource Strain: Not on file  Food Insecurity: Not on file  Transportation Needs: Not on file  Physical Activity: Not on file  Stress: Not on file  Social Connections: Not on file   Past Surgical History:  Procedure Laterality Date   ABDOMINAL HYSTERECTOMY     CARDIAC SURGERY  1955   closed hole in heart   CATARACT EXTRACTION Left 02/02/2016   LUMBAR DISC SURGERY     Dr Renae Fickle   no colonoscopy     "scared"; [facility] reviewed   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; 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   Past Surgical History:  Procedure Laterality Date   ABDOMINAL HYSTERECTOMY     CARDIAC SURGERY  1955   closed hole in heart   CATARACT EXTRACTION Left 02/02/2016   LUMBAR DISC SURGERY     Dr Renae Fickle   no colonoscopy     "scared"; [facility] reviewed   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; 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   Past Medical History:  Diagnosis Date  Abnormality of gait    Brachial neuritis or radiculitis NOS    Cervicalgia    Facial nerve disorder    Hearing loss    Left ear   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   Hyperlipidemia    Hypertension    Macular degeneration    Myofascial pain    Pneumonia 2005   Thyroid disease    hyprothyroidism   Trigeminal neuralgia    Ht 5\' 3"  (1.6 m)   Wt 169 lb (76.7 kg)   BMI 29.94 kg/m   Opioid Risk Score:   Fall Risk Score:  `1  Depression screen [facility] 2/9     08/16/2022    9:24 AM 06/16/2022    9:34 AM 04/20/2022    9:19 AM 02/03/2022    9:10 AM 10/14/2021    9:02 AM 08/17/2021    8:22 AM 05/19/2021    9:00 AM  Depression screen PHQ 2/9  Decreased Interest 0 0 0 0 0 0 0  Down, Depressed, Hopeless 0 0 0 0 0 0 0  PHQ - 2 Score 0 0 0 0 0 0 0     Review of Systems  Musculoskeletal:        Jaw pain  All other systems reviewed and are  negative.     Objective:   Physical Exam Vitals and nursing note reviewed.  Constitutional:      Appearance: Normal appearance.  Cardiovascular:     Rate and Rhythm: Normal rate and regular rhythm.  Pulmonary:     Effort: Pulmonary effort is normal.     Breath sounds: Normal breath sounds.  Musculoskeletal:     Cervical back: Normal range of motion and neck supple.     Comments: Normal Muscle Bulk and Muscle Testing Reveals:  Upper Extremities: Full ROM and Muscle Strength 5/5  Lumbar Paraspinal Tenderness: L-3-L-5 Lower Extremities : Full ROM and Muscle Strength 5/5 Arises from Table with ease Narrow Based Gait     Skin:    General: Skin is warm and dry.  Neurological:     Mental Status: She is alert and oriented to person, place, and time.  Psychiatric:        Mood and Affect: Mood normal.        Behavior: Behavior normal.         Assessment & Plan:  1. History of vestibular schwannoma resulting in trigeminal neuralgia and left facial nerve injury./ Dizziness: Continue Tapentadol. Continue to monitor  10/19/2022. Refilled: Methadone 10 mg daily #30, second script sent for the following month and Nucunta 50 mg one tablet every 12 hours as needed #30.  We will continue the opioid monitoring program, this consists of regular clinic visits, examinations, urine drug screen, pill counts as well as use of [place] Controlled Substance Reporting system. A 12 month History has been reviewed on the [place] Controlled Substance Reporting System on 10/19/2022.  2. History of cervicalgia/ Cervical Radiculitis: No complaints today. Continue current medication regimen. Continue HEP and Continue to Monitor. 10/19/2022 3. 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 Continue to monitor. 10/19/2022  4. Lumbar Radiculitis: No complaints today. Continue current medication Regimen. Continue Nucynta. 10/19/2022 5. Greater  Trochanter Bursitis of Left Hip: No complaints today. Alternate with Ice and Heat Therapy. Continue to Monitor. 09/032024   F/U in 2 months

## 2022-10-23 LAB — TOXASSURE SELECT,+ANTIDEPR,UR

## 2022-11-09 ENCOUNTER — Encounter (INDEPENDENT_AMBULATORY_CARE_PROVIDER_SITE_OTHER): Payer: Medicare Other | Admitting: Ophthalmology

## 2022-11-30 ENCOUNTER — Encounter (INDEPENDENT_AMBULATORY_CARE_PROVIDER_SITE_OTHER): Payer: Medicare Other | Admitting: Ophthalmology

## 2022-12-08 ENCOUNTER — Encounter (INDEPENDENT_AMBULATORY_CARE_PROVIDER_SITE_OTHER): Payer: Medicare Other | Admitting: Ophthalmology

## 2022-12-08 DIAGNOSIS — H353132 Nonexudative age-related macular degeneration, bilateral, intermediate dry stage: Secondary | ICD-10-CM | POA: Diagnosis not present

## 2022-12-08 DIAGNOSIS — H35033 Hypertensive retinopathy, bilateral: Secondary | ICD-10-CM

## 2022-12-08 DIAGNOSIS — I10 Essential (primary) hypertension: Secondary | ICD-10-CM

## 2022-12-08 DIAGNOSIS — H43813 Vitreous degeneration, bilateral: Secondary | ICD-10-CM | POA: Diagnosis not present

## 2022-12-20 ENCOUNTER — Encounter: Payer: Medicare Other | Attending: Physical Medicine & Rehabilitation | Admitting: Registered Nurse

## 2022-12-20 ENCOUNTER — Encounter: Payer: Self-pay | Admitting: Registered Nurse

## 2022-12-20 VITALS — BP 164/88 | HR 63 | Ht 63.0 in | Wt 171.0 lb

## 2022-12-20 DIAGNOSIS — Z5181 Encounter for therapeutic drug level monitoring: Secondary | ICD-10-CM | POA: Insufficient documentation

## 2022-12-20 DIAGNOSIS — Z79891 Long term (current) use of opiate analgesic: Secondary | ICD-10-CM | POA: Diagnosis present

## 2022-12-20 DIAGNOSIS — G894 Chronic pain syndrome: Secondary | ICD-10-CM | POA: Insufficient documentation

## 2022-12-20 DIAGNOSIS — G500 Trigeminal neuralgia: Secondary | ICD-10-CM | POA: Diagnosis not present

## 2022-12-20 DIAGNOSIS — M47816 Spondylosis without myelopathy or radiculopathy, lumbar region: Secondary | ICD-10-CM | POA: Insufficient documentation

## 2022-12-20 DIAGNOSIS — R609 Edema, unspecified: Secondary | ICD-10-CM | POA: Diagnosis present

## 2022-12-20 DIAGNOSIS — D333 Benign neoplasm of cranial nerves: Secondary | ICD-10-CM | POA: Insufficient documentation

## 2022-12-20 DIAGNOSIS — M5416 Radiculopathy, lumbar region: Secondary | ICD-10-CM | POA: Insufficient documentation

## 2022-12-20 MED ORDER — TAPENTADOL HCL 50 MG PO TABS
50.0000 mg | ORAL_TABLET | Freq: Two times a day (BID) | ORAL | 0 refills | Status: DC | PRN
Start: 2022-12-20 — End: 2022-12-20

## 2022-12-20 MED ORDER — METHADONE HCL 10 MG PO TABS
10.0000 mg | ORAL_TABLET | Freq: Every day | ORAL | 0 refills | Status: DC
Start: 2022-12-20 — End: 2023-02-18

## 2022-12-20 MED ORDER — TAPENTADOL HCL 50 MG PO TABS
50.0000 mg | ORAL_TABLET | Freq: Two times a day (BID) | ORAL | 0 refills | Status: DC | PRN
Start: 2022-12-20 — End: 2023-04-18

## 2022-12-20 MED ORDER — METHADONE HCL 10 MG PO TABS
10.0000 mg | ORAL_TABLET | Freq: Every day | ORAL | 0 refills | Status: DC
Start: 2022-12-20 — End: 2022-12-20

## 2022-12-20 NOTE — Progress Notes (Signed)
Subjective:    Patient ID: [name], female    DOB: [birth year], 77 y.o.   MRN: [id number]  HPI: [name] is a 77 y.o. female who returns for follow up appointment for chronic pain and medication refill. She reports left facial pain and lower back pain radiating into her buttocks. She rates her pain 6. Her current exercise regime is walking and performing stretching exercises.  [name] Morphine equivalent is   87.00 MME.  Last UDS was Performed on 10/19/2022, it was consistent.    Pain Inventory Average Pain 7 Pain Right Now 6 My pain is sharp, burning, and tingling  In the last 24 hours, has pain interfered with the following? General activity 8 Relation with others 9 Enjoyment of life 10 What TIME of day is your pain at its worst? daytime and evening Sleep (in general) Good  Pain is worse with:  talking Pain improves with: medication Relief from Meds: 8  Family History  Problem Relation Age of Onset   Hypertension Mother    Transient ischemic attack Mother    Coronary artery disease Father        CHF   Coronary artery disease Sister    Macular degeneration Sister    Transient ischemic attack Maternal Grandmother    Diabetes Neg Hx    Cancer Neg Hx    Social History   Socioeconomic History   Marital status: Married    Spouse name: Not on file   Number of children: 1   Years of education: Not on file   Highest education level: Not on file  Occupational History   Occupation: Retired  Tobacco Use   Smoking status: Never   Smokeless tobacco: Never  Vaping Use   Vaping status: Never Used  Substance and Sexual Activity   Alcohol use: No    Alcohol/week: 0.0 standard drinks of alcohol   Drug use: No   Sexual activity: Not on file  Other Topics Concern   Not on file  Social History Narrative   Not on file   Social Determinants of Health   Financial Resource Strain: Not on file  Food Insecurity: Not on file  Transportation Needs: Not  on file  Physical Activity: Not on file  Stress: Not on file  Social Connections: Not on file   Past Surgical History:  Procedure Laterality Date   ABDOMINAL HYSTERECTOMY     CARDIAC SURGERY  1955   closed hole in heart   CATARACT EXTRACTION Left 02/02/2016   LUMBAR DISC SURGERY     Dr Renae Fickle   no colonoscopy     "scared"; [facility] reviewed   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  Hyperlipidemia    Hypertension    Macular degeneration    Myofascial pain    Pneumonia 2005   Thyroid disease    hyprothyroidism   Trigeminal neuralgia    Pulse 63   Ht 5\' 3"  (1.6 m)   Wt 171 lb (77.6 kg)   SpO2 98%   BMI 30.29 kg/m   Opioid Risk Score:   Fall Risk Score:  `1  Depression screen [facility] 2/9     08/16/2022    9:24 AM 06/16/2022    9:34 AM 04/20/2022    9:19 AM 02/03/2022    9:10 AM 10/14/2021    9:02 AM 08/17/2021    8:22 AM 05/19/2021    9:00 AM  Depression screen PH[facility] 2/9  Decreased Interest 0 0 0 0 0 0 0  Down, Depressed, Hopeless 0 0 0 0 0 0 0  PHQ - 2 Score 0 0 0 0 0 0 0      Review of Systems  Musculoskeletal:        Jaw pain   All other systems reviewed and are negative.     Objective:   Physical Exam Vitals and nursing note reviewed.  Constitutional:      Appearance: Normal appearance.  Cardiovascular:     Rate and Rhythm: Normal rate and regular rhythm.     Pulses: Normal pulses.      Heart sounds: Normal heart sounds.  Pulmonary:     Effort: Pulmonary effort is normal.     Breath sounds: Normal breath sounds.  Musculoskeletal:     Right lower leg: Edema present.     Left lower leg: Edema present.     Comments: Normal Muscle Bulk and Muscle Testing Reveals:  Upper Extremities: Full ROM and Muscle Strength 5/5 Lumbar Paraspinal Tenderness: L-4-L-5 Lower Extremities: Full ROM and Muscle Strength 5/5  Arises from Chair slowly Narrow Based  Gait     Skin:    General: Skin is warm and dry.  Neurological:     Mental Status: She is alert and oriented to person, place, and time.  Psychiatric:        Mood and Affect: Mood normal.        Behavior: Behavior normal.          Assessment & Plan:  1. History of vestibular schwannoma resulting in trigeminal neuralgia and left facial nerve injury./ Dizziness: Continue Tapentadol. Continue to monitor  12/20/2022. Refilled: Methadone 10 mg daily #30, second script sent for the following month and Nucunta 50 mg one tablet every 12 hours as needed #30.  We will continue the opioid monitoring program, this consists of regular clinic visits, examinations, urine drug screen, pill counts as well as use of [place] Controlled Substance Reporting system. A 12 month History has been reviewed on the [place] Controlled Substance Reporting System on 12/20/2022.  2. History of cervicalgia/ Cervical Radiculitis: No complaints today. Continue current medication regimen. Continue HEP and Continue to Monitor. 12/20/2022 3. 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 Continue to monitor. 12/20/2022  4. Lumbar Radiculitis: No complaints today. Continue current medication Regimen. Continue Nucynta. 12/20/2022 5. Greater Trochanter Bursitis of Left Hip: No complaints today. Alternate with Ice and Heat Therapy. Continue to Monitor. 11/042024 6. Pitting Edema 3+: She will F/U with her  PCP: She Verbalizes understanding.   7. Essential Hypertension: She is complaint with her anti-hypertensive medication. She will keep a Blood Pressure Log and F/U  with her PCP. She verbalizes understanding.   F/U in 2 months

## 2022-12-28 ENCOUNTER — Telehealth: Payer: Self-pay

## 2022-12-28 NOTE — Telephone Encounter (Signed)
Mrs. Naseem called: "I will not need a referral placed for [facility]." She has been seen there before. Also there is no need to call her back about this message.

## 2023-02-17 ENCOUNTER — Ambulatory Visit: Payer: Medicare Other | Admitting: Registered Nurse

## 2023-02-18 ENCOUNTER — Encounter: Payer: Self-pay | Admitting: Registered Nurse

## 2023-02-18 ENCOUNTER — Encounter: Payer: Medicare Other | Attending: Physical Medicine & Rehabilitation | Admitting: Registered Nurse

## 2023-02-18 VITALS — BP 147/81 | HR 82 | Ht 63.0 in | Wt 171.4 lb

## 2023-02-18 DIAGNOSIS — M47816 Spondylosis without myelopathy or radiculopathy, lumbar region: Secondary | ICD-10-CM | POA: Insufficient documentation

## 2023-02-18 DIAGNOSIS — G500 Trigeminal neuralgia: Secondary | ICD-10-CM | POA: Diagnosis present

## 2023-02-18 DIAGNOSIS — Z5181 Encounter for therapeutic drug level monitoring: Secondary | ICD-10-CM | POA: Insufficient documentation

## 2023-02-18 DIAGNOSIS — M25551 Pain in right hip: Secondary | ICD-10-CM | POA: Insufficient documentation

## 2023-02-18 DIAGNOSIS — D333 Benign neoplasm of cranial nerves: Secondary | ICD-10-CM | POA: Diagnosis present

## 2023-02-18 DIAGNOSIS — G8929 Other chronic pain: Secondary | ICD-10-CM | POA: Insufficient documentation

## 2023-02-18 DIAGNOSIS — Z79891 Long term (current) use of opiate analgesic: Secondary | ICD-10-CM | POA: Insufficient documentation

## 2023-02-18 DIAGNOSIS — G894 Chronic pain syndrome: Secondary | ICD-10-CM | POA: Diagnosis present

## 2023-02-18 MED ORDER — METHADONE HCL 10 MG PO TABS
10.0000 mg | ORAL_TABLET | Freq: Every day | ORAL | 0 refills | Status: DC
Start: 2023-02-18 — End: 2023-02-18

## 2023-02-18 MED ORDER — METHADONE HCL 10 MG PO TABS
10.0000 mg | ORAL_TABLET | Freq: Every day | ORAL | 0 refills | Status: DC
Start: 2023-02-18 — End: 2023-04-18

## 2023-02-18 NOTE — Progress Notes (Signed)
 Subjective:    Patient ID: [name], female    DOB: 1945/08/21, 78 y.o.   MRN: [id number]  HPI: [name] is a 78 y.o. female who returns for follow up appointment for chronic pain and medication refill. She states she has left facial pain, lower back and right hip pain.  She rates her pain 5. Her current exercise regime is walking and performing stretching exercises.  [name] Morphine equivalent is 47.00 MME.   UDS ordered Today.     Pain Inventory Average Pain 7 Pain Right Now 5 My pain is sharp, burning, stabbing, and tingling  In the last 24 hours, has pain interfered with the following? General activity 8 Relation with others 9 Enjoyment of life 10 What TIME of day is your pain at its worst? daytime and evening Sleep (in general) Good  Pain is worse with:  talking Pain improves with: rest, heat/ice, and medication Relief from Meds: 6  Family History  Problem Relation Age of Onset   Hypertension Mother    Transient ischemic attack Mother    Coronary artery disease Father        CHF   Coronary artery disease Sister    Macular degeneration Sister    Transient ischemic attack Maternal Grandmother    Diabetes Neg Hx    Cancer Neg Hx    Social History   Socioeconomic History   Marital status: Married    Spouse name: Not on file   Number of children: 1   Years of education: Not on file   Highest education level: Not on file  Occupational History   Occupation: Retired  Tobacco Use   Smoking status: Never   Smokeless tobacco: Never  Vaping Use   Vaping status: Never Used  Substance and Sexual Activity   Alcohol  use: No    Alcohol /week: 0.0 standard drinks of alcohol    Drug use: No   Sexual activity: Not on file  Other Topics Concern   Not on file  Social History Narrative   Not on file   Social Drivers of Health   Financial Resource Strain: Not on file  Food Insecurity: Not on file  Transportation Needs: Not on file  Physical  Activity: Not on file  Stress: Not on file  Social Connections: Not on file   Past Surgical History:  Procedure Laterality Date   ABDOMINAL HYSTERECTOMY     CARDIAC SURGERY  1955   closed hole in heart   CATARACT EXTRACTION Left 02/02/2016   LUMBAR DISC SURGERY     Dr Deward   no colonoscopy     scared; [facility] reviewed   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; 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   Past Surgical History:  Procedure Laterality Date   ABDOMINAL HYSTERECTOMY     CARDIAC SURGERY  1955   closed hole in heart   CATARACT EXTRACTION Left 02/02/2016   LUMBAR DISC SURGERY     Dr Deward   no colonoscopy     scared; [facility] reviewed   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; 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   Past Medical History:  Diagnosis Date   Abnormality of gait    Brachial neuritis or radiculitis NOS    Cervicalgia    Facial nerve disorder    Hearing loss    Left ear   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  Hyperlipidemia    Hypertension    Macular degeneration    Myofascial pain    Pneumonia 2005   Thyroid  disease    hyprothyroidism   Trigeminal neuralgia    BP (!) 147/81   Pulse 82   Ht 5' 3 (1.6 m)   Wt 171 lb 6.4 oz (77.7 kg)   SpO2 98%   BMI 30.36 kg/m   Opioid Risk Score:   Fall Risk Score:  `1  Depression screen [facility] 2/9     08/16/2022    9:24 AM 06/16/2022    9:34 AM 04/20/2022    9:19 AM 02/03/2022    9:10 AM 10/14/2021    9:02 AM 08/17/2021    8:22 AM 05/19/2021    9:00 AM  Depression screen PHQ 2/9  Decreased Interest 0 0 0 0 0 0 0  Down, Depressed, Hopeless 0 0 0 0 0 0 0  PHQ - 2 Score 0 0 0 0 0 0 0     Review of Systems  All other systems reviewed and are negative.      Objective:   Physical Exam Vitals and nursing note reviewed.  Constitutional:      Appearance: Normal appearance.  Cardiovascular:     Rate and Rhythm: Normal rate and regular rhythm.     Pulses: Normal pulses.     Heart sounds: Normal heart  sounds.  Pulmonary:     Effort: Pulmonary effort is normal.     Breath sounds: Normal breath sounds.  Musculoskeletal:     Comments: Normal Muscle Bulk and Muscle Testing Reveals:  Upper Extremities: Upper Extremity: Full ROM and Muscle Strength 5/5  Lumbar Paraspinal Tenderness: L-4-L-5 Lower Extremities: Full ROM and Muscle Strength 5/5 Arises from Table slowly Narrow Based  Gait     Skin:    General: Skin is warm and dry.  Neurological:     Mental Status: She is alert and oriented to person, place, and time.  Psychiatric:        Mood and Affect: Mood normal.        Behavior: Behavior normal.         Assessment & Plan:  1. History of vestibular schwannoma resulting in trigeminal neuralgia and left facial nerve injury./ Dizziness: Continue Tapentadol . Continue to monitor  02/18/2023. Refilled: Methadone  10 mg daily #30, second script sent for the following month and Nucunta 50 mg one tablet every 12 hours as needed #30.  We will continue the opioid monitoring program, this consists of regular clinic visits, examinations, urine drug screen, pill counts as well as use of [place]  Controlled Substance Reporting system. A 12 month History has been reviewed on the [place]  Controlled Substance Reporting System on 02/18/2023.  2. History of cervicalgia/ Cervical Radiculitis: No complaints today. Continue current medication regimen. Continue HEP and Continue to Monitor. 02/18/2023 3. 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 Continue to monitor. 02/18/2023  4. Lumbar Radiculitis: No complaints today. Continue current medication Regimen. Continue Nucynta . 02/18/2023 5. Greater Trochanter Bursitis of Left Hip: No complaints today. Alternate with Ice and Heat Therapy. Continue to Monitor. 10/032025   F/U in 2 months

## 2023-02-22 LAB — TOXASSURE SELECT,+ANTIDEPR,UR

## 2023-03-11 ENCOUNTER — Telehealth: Payer: Self-pay

## 2023-03-11 NOTE — Telephone Encounter (Signed)
[name] has a pending surgery procedure with [facility], by Dr. Jodi Geralds. Darel Hong at [facility] has requested a written letter / note on the post op pain management.   Darel Hong has been told that PM&R covers Steward Drone Danzy's chronic pain. The surgeon usually covers the post-op pain. So, she has been advised to fax over the requested information  for review & reply.

## 2023-03-14 NOTE — Telephone Encounter (Addendum)
The  PREOPERATIVE RISK ASSESSMENT form has been received. I called Darel Hong back at [facility] to confirm the we do not handle the "post-op" pain. We handle the "chronic pain.'' She can not take a verbal for this action. She needs documentation from a provider.     Jacalyn Lefevre NP, has agreed to supply separate documentation in reference to this request.  On how Physical Medicine & Rehabilitation handle pain medication for post-op pain.   Please fax the reply to:  [facility] Att: Dr. Lamona Curl Phone [phone] Fax: [phone]

## 2023-03-14 NOTE — Telephone Encounter (Signed)
Reply faxed on 03/14/2023.

## 2023-04-12 NOTE — Progress Notes (Unsigned)
 Cardiology Office Note:  .   Date:  04/13/2023  ID:  [name], DOB [birth year], MRN [id number] PCP: Lavada Mesi, MD  [facility] Providers Cardiologist:  Lesleigh Noe, MD (Inactive) {  History of Present Illness: .   [name] is a 78 y.o. 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  She is followed by Norma Fredrickson, NP.  She was then seen by Dr. Katrinka Blazing.  She was seen in March 2022 and a 14-day monitor was ordered.  Longest episode of NSVT was 45 beats.  Rare PVCs.  Try metoprolol succinate 25 mg a day but had side effects and stopped taking the medication.  Despite being off of medications she has not been bothered again by racing heartbeats or palpitations.  Was last seen in April 2022 by Dr. Katrinka Blazing.  Extremely elevated total cholesterol.  Unfortunately, her laboratory data could not be reviewed from her PCP.  Most recent cholesterol from 2018 revealed a total of 260.  On no therapy and desires not to be on therapy unless absolutely necessary.  Her brother died on the surgery table attempting to undergo coronary bypass surgery.  Today, she presents  with a history of a brain tumor and palpitations with chronic back and hip pain. She reports being told she needs a hip replacement, which she is not enthusiastic about. The patient has been experiencing shortness of breath with exertion, which she attributes to her hip pain limiting her physical activity. She also reports a history of palpitations, which have been infrequent since undergoing an ablation procedure. The patient has been managing her high blood pressure with Avapro (Irbesartan), but admits to only taking half the prescribed dose. She also takes Lasix (Furosemide) daily for leg swelling, which  is worse on the left side. The patient has not had any recent injuries to explain the increased swelling on the left side.  Reports no shortness of breath nor dyspnea on exertion. Reports no chest pain, pressure, or tightness. No edema, orthopnea, PND. Reports no palpitations.   Discussed the use of AI scribe software for clinical note transcription with the patient, who gave verbal consent to proceed.  Dr. Luiz Blare [facility] [phone]  ROS: Pertinent ROS in HPI  Studies Reviewed: Marland Kitchen   EKG Interpretation Date/Time:  Wednesday April 13 2023 09:01:56 EST Ventricular Rate:  69 PR Interval:  152 QRS Duration:  76 QT Interval:  410 QTC Calculation: 439 R Axis:   36  Text Interpretation: Normal sinus rhythm Normal ECG When compared with ECG of 16-Aug-2003 07:53, No significant change was found Confirmed by Jari Favre [phone]) on 04/13/2023 9:12:46 AM    14-day monitor May 15, 2020: Study Highlights     Basic rhythm is NSR with HR range 53 to 150 bpm. Ave HR 74 bpm. Non sustained SVT occurred 24 times with longest 42 beats at 105 bpm c/w ectopic atrial tachycardia No atrial fibrillation Rare PVC's      Physical Exam:   VS:  BP (!) 158/88   Pulse 69   Ht 5\' 3"  (1.6 m)   Wt 170 lb 9.6 oz (77.4 kg)   SpO2 95%   BMI 30.22 kg/m    Wt Readings from Last 3 Encounters:  04/13/23 170 lb 9.6 oz (77.4 kg)  02/18/23 171 lb 6.4 oz (  77.7 kg)  12/20/22 171 lb (77.6 kg)    GEN: Well nourished, well developed in no acute distress NECK: No JVD; No carotid bruits CARDIAC: RRR, no murmurs, rubs, gallops RESPIRATORY:  Clear to auscultation without rales, wheezing or rhonchi  ABDOMEN: Soft, non-tender, non-distended EXTREMITIES:  No edema; No deformity   ASSESSMENT AND PLAN: .   Preop clearance  Ms. Kassem's perioperative risk of a major cardiac event is 0.4% according to the Revised Cardiac Risk Index (RCRI).  Therefore, she is at low risk for perioperative complications.    Her functional capacity is good at 5.29 METs according to the [place] Activity Status Index (DASI). Recommendations: According to ACC/AHA guidelines, no further cardiovascular testing needed.  The patient may proceed to surgery at acceptable risk.    Hypertension Elevated blood pressure, currently on Irbesartan 150mg  daily, but not consistently. -Increase Irbesartan to 300mg  daily. -Track blood pressure for a week and report values.  Lower extremity edema Bilateral lower extremity swelling, worse on the left side, possibly due to venous insufficiency. -Recommend use of compression stockings. -Consider arterial study if no improvement with compression stockings.   Supraventricular Tachycardia (SVT) History of SVT, treated with ablation. Infrequent palpitations since procedure. -No change in management.  Hyperlipidemia -No updated labs to review today -Would recommend updated lipid panel and LFTs when she sees her primary      Dispo: -She can follow-up in 6 months with Dr. Odis Hollingshead  Signed, Sharlene Dory, PA-C

## 2023-04-13 ENCOUNTER — Telehealth: Payer: Self-pay | Admitting: *Deleted

## 2023-04-13 ENCOUNTER — Encounter: Payer: Self-pay | Admitting: Physician Assistant

## 2023-04-13 ENCOUNTER — Ambulatory Visit: Payer: Medicare Other | Attending: Physician Assistant | Admitting: Physician Assistant

## 2023-04-13 VITALS — BP 158/88 | HR 69 | Ht 63.0 in | Wt 170.6 lb

## 2023-04-13 DIAGNOSIS — I10 Essential (primary) hypertension: Secondary | ICD-10-CM

## 2023-04-13 DIAGNOSIS — I471 Supraventricular tachycardia, unspecified: Secondary | ICD-10-CM

## 2023-04-13 DIAGNOSIS — E785 Hyperlipidemia, unspecified: Secondary | ICD-10-CM

## 2023-04-13 DIAGNOSIS — R002 Palpitations: Secondary | ICD-10-CM

## 2023-04-13 MED ORDER — IRBESARTAN 300 MG PO TABS: 300.0000 mg | ORAL_TABLET | Freq: Every day | ORAL | 3 refills | Status: AC

## 2023-04-13 NOTE — Telephone Encounter (Signed)
   Pre-operative Risk Assessment    Patient Name: [name]  DOB: October 21, 1945 MRN: [id number]   Date of last office visit: 05/21/20 DR. SMITH Date of next office visit: 04/13/23 Jari Favre, [facility]   Request for Surgical Clearance    Procedure:   RIGHT TOTAL HIP REPLACEMENT  Date of Surgery:  Clearance TBD                                Surgeon:  DR. Jodi Geralds Surgeon's Group or Practice Name:  [facility] Phone number:  [phone] Fax number:  [phone] ATTN:JUDY    Type of Clearance Requested:   - Medical    Type of Anesthesia:  Spinal   Additional requests/questions:    Elpidio Anis   04/13/2023, 9:43 AM

## 2023-04-13 NOTE — Patient Instructions (Addendum)
 Medication Instructions:  Refilled AVAPRO *If you need a refill on your cardiac medications before your next appointment, please call your pharmacy*   Follow-Up: At [facility], you and your health needs are our priority.  As part of our continuing mission to provide you with exceptional heart care, we have created designated Provider Care Teams.  These Care Teams include your primary Cardiologist (physician) and Advanced Practice Providers (APPs -  Physician Assistants and Nurse Practitioners) who all work together to provide you with the care you need, when you need it.  We recommend signing up for the patient portal called "MyChart".  Sign up information is provided on this After Visit Summary.  MyChart is used to connect with patients for Virtual Visits (Telemedicine).  Patients are able to view lab/test results, encounter notes, upcoming appointments, etc.  Non-urgent messages can be sent to your provider as well.   To learn more about what you can do with MyChart, go to [email or web address].    Your next appointment:   6 month(s)  Provider:   Odis Hollingshead, DO  Other Instructions  Check blood pressure 1 hour after taking AM medications. Keep record for 1 week and send [place] the readings.  [facility]: [address] [place], [place] [address], # [phone]     1st Floor: - Lobby - Registration  - Pharmacy  - Lab - Cafe  2nd Floor: - PV Lab - Diagnostic Testing (echo, CT, nuclear med)  3rd Floor: - Vacant  4th Floor: - TCTS (cardiothoracic surgery) - AFib Clinic - Structural Heart Clinic - Vascular Surgery  - Vascular Ultrasound  5th Floor: - HeartCare Cardiology (general and EP) - Clinical Pharmacy for coumadin, hypertension, lipid, weight-loss medications, and med management appointments    Valet parking services will be available as well.

## 2023-04-15 ENCOUNTER — Other Ambulatory Visit: Payer: Self-pay | Admitting: Orthopedic Surgery

## 2023-04-18 ENCOUNTER — Encounter: Payer: Medicare Other | Attending: Physical Medicine & Rehabilitation | Admitting: Registered Nurse

## 2023-04-18 VITALS — BP 163/81 | HR 94 | Ht 63.0 in | Wt 174.0 lb

## 2023-04-18 DIAGNOSIS — G8929 Other chronic pain: Secondary | ICD-10-CM | POA: Diagnosis present

## 2023-04-18 DIAGNOSIS — Z5181 Encounter for therapeutic drug level monitoring: Secondary | ICD-10-CM | POA: Diagnosis present

## 2023-04-18 DIAGNOSIS — G894 Chronic pain syndrome: Secondary | ICD-10-CM | POA: Insufficient documentation

## 2023-04-18 DIAGNOSIS — M47816 Spondylosis without myelopathy or radiculopathy, lumbar region: Secondary | ICD-10-CM | POA: Diagnosis present

## 2023-04-18 DIAGNOSIS — G500 Trigeminal neuralgia: Secondary | ICD-10-CM | POA: Diagnosis present

## 2023-04-18 DIAGNOSIS — D333 Benign neoplasm of cranial nerves: Secondary | ICD-10-CM | POA: Diagnosis not present

## 2023-04-18 DIAGNOSIS — M25551 Pain in right hip: Secondary | ICD-10-CM | POA: Insufficient documentation

## 2023-04-18 DIAGNOSIS — Z79891 Long term (current) use of opiate analgesic: Secondary | ICD-10-CM | POA: Insufficient documentation

## 2023-04-18 MED ORDER — TAPENTADOL HCL 50 MG PO TABS: 50.0000 mg | ORAL_TABLET | Freq: Two times a day (BID) | ORAL | 0 refills | Status: DC | PRN

## 2023-04-18 MED ORDER — METHADONE HCL 10 MG PO TABS: 10.0000 mg | ORAL_TABLET | Freq: Every day | ORAL | 0 refills | Status: DC

## 2023-04-18 NOTE — Progress Notes (Unsigned)
 Subjective:    Patient ID: [name], female    DOB: February 05, 1946, 78 y.o.   MRN: [id number]  HPI: [name] is a 78 y.o. female who returns for follow up appointment for chronic pain and medication refill. states *** pain is located in  ***. rates pain ***. current exercise regime is walking and performing stretching exercises.  [name] Morphine equivalent is *** MME.   Last UDS was Performed o 02/17/2022, it was consistent.    Pain Inventory Average Pain 9 Pain Right Now 6 My pain is burning, stabbing, and tingling  In the last 24 hours, has pain interfered with the following? General activity 7 Relation with others 5 Enjoyment of life 5 What TIME of day is your pain at its worst? evening and night Sleep (in general) Good  Pain is worse with:  talking Pain improves with: heat/ice and medication Relief from Meds: 8  Family History  Problem Relation Age of Onset   Hypertension Mother    Transient ischemic attack Mother    Coronary artery disease Father        CHF   Coronary artery disease Sister    Macular degeneration Sister    Transient ischemic attack Maternal Grandmother    Diabetes Neg Hx    Cancer Neg Hx    Social History   Socioeconomic History   Marital status: Married    Spouse name: Not on file   Number of children: 1   Years of education: Not on file   Highest education level: Not on file  Occupational History   Occupation: Retired  Tobacco Use   Smoking status: Never   Smokeless tobacco: Never  Vaping Use   Vaping status: Never Used  Substance and Sexual Activity   Alcohol use: No    Alcohol/week: 0.0 standard drinks of alcohol   Drug use: No   Sexual activity: Not on file  Other Topics Concern   Not on file  Social History Narrative   Not on file   Social Drivers of Health   Financial Resource Strain: Not on file  Food Insecurity: Not on file  Transportation Needs: Not on file  Physical Activity: Not on file   Stress: Not on file  Social Connections: Not on file   Past Surgical History:  Procedure Laterality Date   ABDOMINAL HYSTERECTOMY     CARDIAC SURGERY  1955   closed hole in heart   CATARACT EXTRACTION Left 02/02/2016   LUMBAR DISC SURGERY     Dr Renae Fickle   no colonoscopy     "scared"; [facility] reviewed   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; 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   Past Surgical History:  Procedure Laterality Date   ABDOMINAL HYSTERECTOMY     CARDIAC SURGERY  1955   closed hole in heart   CATARACT EXTRACTION Left 02/02/2016   LUMBAR DISC SURGERY     Dr Renae Fickle   no colonoscopy     "scared"; [facility] reviewed   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; 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   Past Medical History:  Diagnosis Date   Abnormality of gait    Brachial neuritis or radiculitis NOS    Cervicalgia    Facial nerve disorder    Hearing loss    Left ear   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   Hyperlipidemia    Hypertension  Macular degeneration    Myofascial pain    Pneumonia 2005   Thyroid disease    hyprothyroidism   Trigeminal neuralgia    BP (!) 159/79   Pulse 94   Ht 5\' 3"  (1.6 m)   Wt 174 lb (78.9 kg)   SpO2 91%   BMI 30.82 kg/m   Opioid Risk Score:   Fall Risk Score:  `1  Depression screen [facility] 2/9     08/16/2022    9:24 AM 06/16/2022    9:34 AM 04/20/2022    9:19 AM 02/03/2022    9:10 AM 10/14/2021    9:02 AM 08/17/2021    8:22 AM 05/19/2021    9:00 AM  Depression screen PHQ 2/9  Decreased Interest 0 0 0 0 0 0 0  Down, Depressed, Hopeless 0 0 0 0 0 0 0  PHQ - 2 Score 0 0 0 0 0 0 0      Review of Systems  Musculoskeletal:        Left fascial pain  All other systems reviewed and are negative.      Objective:   Physical Exam        Assessment & Plan:  1. History of vestibular schwannoma resulting in trigeminal neuralgia and left facial nerve injury./ Dizziness: Continue Tapentadol. Continue to monitor   02/18/2023. Refilled: Methadone 10 mg daily #30, second script sent for the following month and Nucunta 50 mg one tablet every 12 hours as needed #30.  We will continue the opioid monitoring program, this consists of regular clinic visits, examinations, urine drug screen, pill counts as well as use of [place] Controlled Substance Reporting system. A 12 month History has been reviewed on the [place] Controlled Substance Reporting System on 02/18/2023.  2. History of cervicalgia/ Cervical Radiculitis: No complaints today. Continue current medication regimen. Continue HEP and Continue to Monitor. 02/18/2023 3. 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 Continue to monitor. 02/18/2023  4. Lumbar Radiculitis: No complaints today. Continue current medication Regimen. Continue Nucynta. 02/18/2023 5. Greater Trochanter Bursitis of Left Hip: No complaints today. Alternate with Ice and Heat Therapy. Continue to Monitor. 10/032025   F/U in 2 months   w

## 2023-04-18 NOTE — Patient Instructions (Signed)
 My- Chart:   [phone]

## 2023-04-21 ENCOUNTER — Encounter: Payer: Self-pay | Admitting: Registered Nurse

## 2023-05-02 ENCOUNTER — Encounter (HOSPITAL_COMMUNITY): Admission: RE | Admit: 2023-05-02 | Source: Ambulatory Visit

## 2023-05-09 ENCOUNTER — Ambulatory Visit (HOSPITAL_COMMUNITY): Admit: 2023-05-09 | Payer: Medicare Other | Admitting: Orthopedic Surgery

## 2023-05-09 SURGERY — ARTHROPLASTY, HIP, TOTAL, ANTERIOR APPROACH
Anesthesia: Spinal | Site: Hip | Laterality: Right

## 2023-06-15 NOTE — Progress Notes (Unsigned)
 Subjective:    Patient ID: [name], female    DOB: [birth year], 78 y.o.   MRN: [id number]  HPI: [name] is a 78 y.o. female who  is scheduled for Virtual Visit, I connected with [name]  by a video enabled telemedicine application and verified that I am speaking with the correct person using two identifiers.  Location: Patient: In her Home  Provider: In the [facility]    I discussed the limitations of evaluation and management by telemedicine and the availability of in person appointments. The patient expressed understanding and agreed to proceed.   She states her  pain is located in her lower back radiating into her left lower extremity and left facial pain. She rates her pain 6. Her current exercise regime is walking and performing stretching exercises.  [name] Morphine equivalent is 87.00 MME.   Last UDS was Performed on 02/18/2023, it was consistent.      Pain Inventory Average Pain 7 Pain Right Now 6 My pain is intermittent, burning, and stings.  In the last 24 hours, has pain interfered with the following? General activity 7 Relation with others 10 Enjoyment of life 10 What TIME of day is your pain at its worst? daytime and evening Sleep (in general) Good  Pain is worse with: talking Pain improves with: medication and ice pack Relief from Meds: 10  Family History  Problem Relation Age of Onset   Hypertension Mother    Transient ischemic attack Mother    Coronary artery disease Father        CHF   Coronary artery disease Sister    Macular degeneration Sister    Transient ischemic attack Maternal Grandmother    Diabetes Neg Hx    Cancer Neg Hx    Social History   Socioeconomic History   Marital status: Married    Spouse name: Not on file   Number of children: 1   Years of education: Not on file   Highest education level: Not on file  Occupational History   Occupation: Retired  Tobacco Use   Smoking status: Never   Smokeless  tobacco: Never  Vaping Use   Vaping status: Never Used  Substance and Sexual Activity   Alcohol  use: No    Alcohol /week: 0.0 standard drinks of alcohol    Drug use: No   Sexual activity: Not on file  Other Topics Concern   Not on file  Social History Narrative   Not on file   Social Drivers of Health   Financial Resource Strain: Not on file  Food Insecurity: Not on file  Transportation Needs: Not on file  Physical Activity: Not on file  Stress: Not on file  Social Connections: Not on file   Past Surgical History:  Procedure Laterality Date   ABDOMINAL HYSTERECTOMY     CARDIAC SURGERY  1955   closed hole in heart   CATARACT EXTRACTION Left 02/02/2016   LUMBAR DISC SURGERY     Dr Donavon Fudge   no colonoscopy     "scared"; [facility] reviewed   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; 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   Past Surgical History:  Procedure Laterality Date   ABDOMINAL HYSTERECTOMY     CARDIAC SURGERY  1955   closed hole in heart   CATARACT EXTRACTION Left 02/02/2016   LUMBAR DISC SURGERY     Dr Donavon Fudge   no colonoscopy     "scared"; [facility] reviewed  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; 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   Past Medical History:  Diagnosis Date   Abnormality of gait    Brachial neuritis or radiculitis NOS    Cervicalgia    Facial nerve disorder    Hearing loss    Left ear   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   Hyperlipidemia    Hypertension    Macular degeneration    Myofascial pain    Pneumonia 2005   Thyroid  disease    hyprothyroidism   Trigeminal neuralgia    BP 135/69 Comment: MYCHART VISIT TODAY- ALL VITALS REPORTED BY PATIENT  Pulse 68 Comment: MYCHART VISIT TODAY- ALL VITALS REPORTED BY PATIENT  Ht 5\' 3"  (1.6 m)   Wt 171 lb 6.4 oz (77.7 kg) Comment: MYCHART VISIT TODAY- ALL VITALS REPORTED BY PATIENT  BMI 30.36 kg/m   Opioid Risk Score:   Fall Risk Score:  `1  Depression screen [facility] 2/9     08/16/2022    9:24 AM  06/16/2022    9:34 AM 04/20/2022    9:19 AM 02/03/2022    9:10 AM 10/14/2021    9:02 AM 08/17/2021    8:22 AM 05/19/2021    9:00 AM  Depression screen PHQ 2/9  Decreased Interest 0 0 0 0 0 0 0  Down, Depressed, Hopeless 0 0 0 0 0 0 0  PHQ - 2 Score 0 0 0 0 0 0 0    Review of Systems  Neurological:        Pain on the left side of face & mouth  All other systems reviewed and are negative.      Objective:   Physical Exam Vitals and nursing note reviewed.  Musculoskeletal:     Comments: No Physical Exam Performed: Virtual Visit          Assessment & Plan:  1. History of vestibular schwannoma resulting in trigeminal neuralgia and left facial nerve injury./ Dizziness: Continue Tapentadol . Continue to monitor  06/16/2023. Refilled: Methadone  10 mg daily #30, second script sent for the following month and Nucunta 50 mg one tablet every 12 hours as needed #30.  We will continue the opioid monitoring program, this consists of regular clinic visits, examinations, urine drug screen, pill counts as well as use of [place]  Controlled Substance Reporting system. A 12 month History has been reviewed on the [place]  Controlled Substance Reporting System on 06/16/2023.  2. History of cervicalgia/ Cervical Radiculitis: No complaints today. Continue current medication regimen. Continue HEP and Continue to Monitor. 04/18/2023 3. 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 Continue to monitor. 06/16/2023  4. Lumbar Radiculitis: No complaints today. Continue current medication Regimen. Continue Nucynta . 06/16/2023 5. Greater Trochanter Bursitis of Left Hip: No complaints today. Alternate with Ice and Heat Therapy. Continue to Monitor. 05/012025   F/U in 2 months

## 2023-06-16 ENCOUNTER — Encounter: Payer: Self-pay | Admitting: Registered Nurse

## 2023-06-16 ENCOUNTER — Encounter: Attending: Physical Medicine & Rehabilitation | Admitting: Registered Nurse

## 2023-06-16 VITALS — BP 135/69 | HR 68 | Ht 63.0 in | Wt 171.4 lb

## 2023-06-16 DIAGNOSIS — D333 Benign neoplasm of cranial nerves: Secondary | ICD-10-CM | POA: Insufficient documentation

## 2023-06-16 DIAGNOSIS — Z79891 Long term (current) use of opiate analgesic: Secondary | ICD-10-CM | POA: Insufficient documentation

## 2023-06-16 DIAGNOSIS — M47816 Spondylosis without myelopathy or radiculopathy, lumbar region: Secondary | ICD-10-CM | POA: Insufficient documentation

## 2023-06-16 DIAGNOSIS — G500 Trigeminal neuralgia: Secondary | ICD-10-CM | POA: Insufficient documentation

## 2023-06-16 DIAGNOSIS — M5416 Radiculopathy, lumbar region: Secondary | ICD-10-CM | POA: Insufficient documentation

## 2023-06-16 DIAGNOSIS — G894 Chronic pain syndrome: Secondary | ICD-10-CM | POA: Diagnosis not present

## 2023-06-16 DIAGNOSIS — Z5181 Encounter for therapeutic drug level monitoring: Secondary | ICD-10-CM | POA: Insufficient documentation

## 2023-06-16 MED ORDER — METHADONE HCL 10 MG PO TABS: 10.0000 mg | ORAL_TABLET | Freq: Every day | ORAL | 0 refills | Status: DC

## 2023-06-16 MED ORDER — TAPENTADOL HCL 50 MG PO TABS: 50.0000 mg | ORAL_TABLET | Freq: Two times a day (BID) | ORAL | 0 refills | Status: DC | PRN

## 2023-06-16 MED ORDER — METHADONE HCL 10 MG PO TABS
10.0000 mg | ORAL_TABLET | Freq: Every day | ORAL | 0 refills | Status: DC
Start: 2023-06-16 — End: 2023-06-16

## 2023-08-16 ENCOUNTER — Encounter: Payer: Self-pay | Admitting: Registered Nurse

## 2023-08-16 ENCOUNTER — Encounter: Attending: Physical Medicine & Rehabilitation | Admitting: Registered Nurse

## 2023-08-16 VITALS — BP 143/83 | HR 65 | Ht 63.0 in | Wt 171.4 lb

## 2023-08-16 DIAGNOSIS — M47816 Spondylosis without myelopathy or radiculopathy, lumbar region: Secondary | ICD-10-CM | POA: Insufficient documentation

## 2023-08-16 DIAGNOSIS — D333 Benign neoplasm of cranial nerves: Secondary | ICD-10-CM | POA: Insufficient documentation

## 2023-08-16 DIAGNOSIS — Z79891 Long term (current) use of opiate analgesic: Secondary | ICD-10-CM | POA: Insufficient documentation

## 2023-08-16 DIAGNOSIS — M5416 Radiculopathy, lumbar region: Secondary | ICD-10-CM | POA: Diagnosis present

## 2023-08-16 DIAGNOSIS — G500 Trigeminal neuralgia: Secondary | ICD-10-CM | POA: Diagnosis present

## 2023-08-16 DIAGNOSIS — G894 Chronic pain syndrome: Secondary | ICD-10-CM | POA: Diagnosis present

## 2023-08-16 DIAGNOSIS — Z5181 Encounter for therapeutic drug level monitoring: Secondary | ICD-10-CM | POA: Diagnosis present

## 2023-08-16 MED ORDER — TAPENTADOL HCL 50 MG PO TABS: 50.0000 mg | ORAL_TABLET | Freq: Two times a day (BID) | ORAL | 0 refills | Status: DC | PRN

## 2023-08-16 MED ORDER — METHADONE HCL 10 MG PO TABS: 10.0000 mg | ORAL_TABLET | Freq: Every day | ORAL | 0 refills | Status: DC

## 2023-08-16 MED ORDER — METHADONE HCL 10 MG PO TABS
10.0000 mg | ORAL_TABLET | Freq: Every day | ORAL | 0 refills | Status: DC
Start: 2023-08-16 — End: 2023-10-11

## 2023-08-16 NOTE — Progress Notes (Signed)
 Subjective:    Patient ID: [name], female    DOB: 1945/07/26, 78 y.o.   MRN: [id number]  HPI: [name] is a 78 y.o. female who returns for follow up appointment for chronic pain and medication refill. She states her pain is located in her  lower back radiating into her let lower extremity and left facial pain. She rates her  pain 5. Her current exercise regime is walking and performing stretching exercises. [name] Morphine equivalent is 87.00 MME.   Oral Swab was Performed today.    Pain Inventory Average Pain 7 Pain Right Now 5 My pain is sharp, burning, dull, and tingling  In the last 24 hours, has pain interfered with the following? General activity 7 Relation with others 9 Enjoyment of life 9 What TIME of day is your pain at its worst? daytime Sleep (in general) Good  Pain is worse with: Talking Pain improves with: rest, heat/ice, and medication Relief from Meds: 8  Family History  Problem Relation Age of Onset   Hypertension Mother    Transient ischemic attack Mother    Coronary artery disease Father        CHF   Coronary artery disease Sister    Macular degeneration Sister    Transient ischemic attack Maternal Grandmother    Diabetes Neg Hx    Cancer Neg Hx    Social History   Socioeconomic History   Marital status: Married    Spouse name: Not on file   Number of children: 1   Years of education: Not on file   Highest education level: Not on file  Occupational History   Occupation: Retired  Tobacco Use   Smoking status: Never   Smokeless tobacco: Never  Vaping Use   Vaping status: Never Used  Substance and Sexual Activity   Alcohol  use: No    Alcohol /week: 0.0 standard drinks of alcohol    Drug use: No   Sexual activity: Not on file  Other Topics Concern   Not on file  Social History Narrative   Not on file   Social Drivers of Health   Financial Resource Strain: Not on file  Food Insecurity: Not on file  Transportation  Needs: Not on file  Physical Activity: Not on file  Stress: Not on file  Social Connections: Not on file   Past Surgical History:  Procedure Laterality Date   ABDOMINAL HYSTERECTOMY     CARDIAC SURGERY  1955   closed hole in heart   CATARACT EXTRACTION Left 02/02/2016   LUMBAR DISC SURGERY     Dr Deward   no colonoscopy     scared; [facility] reviewed   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; 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   Past Surgical History:  Procedure Laterality Date   ABDOMINAL HYSTERECTOMY     CARDIAC SURGERY  1955   closed hole in heart   CATARACT EXTRACTION Left 02/02/2016   LUMBAR DISC SURGERY     Dr Deward   no colonoscopy     scared; [facility] reviewed   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; 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   Past Medical History:  Diagnosis Date   Abnormality of gait    Brachial neuritis or radiculitis NOS    Cervicalgia    Facial nerve disorder    Hearing loss    Left ear   History of brain tumor 2005   at  base of brain stem   Hyperlipidemia    Hypertension    Macular degeneration    Myofascial pain    Pneumonia 2005   Thyroid  disease    hyprothyroidism   Trigeminal neuralgia    BP (!) 143/83 (BP Location: Left Arm, Patient Position: Sitting, Cuff Size: Large) Comment: second BP reading  Pulse 65   Ht 5' 3 (1.6 m)   Wt 171 lb 6.4 oz (77.7 kg)   SpO2 99%   BMI 30.36 kg/m   Opioid Risk Score:   Fall Risk Score:  `1  Depression screen [facility] 2/9     08/16/2023   10:19 AM 06/16/2023    9:47 AM 08/16/2022    9:24 AM 06/16/2022    9:34 AM 04/20/2022    9:19 AM 02/03/2022    9:10 AM 10/14/2021    9:02 AM  Depression screen PHQ 2/9  Decreased Interest 0 0 0 0 0 0 0  Down, Depressed, Hopeless 0 0 0 0 0 0 0  PHQ - 2 Score 0 0 0 0 0 0 0  Altered sleeping 0        Tired, decreased energy 0        Change in appetite 0        Feeling bad or failure about yourself  0        Trouble concentrating 0        Moving slowly or fidgety/restless 0         Suicidal thoughts 0        PHQ-9 Score 0           Review of Systems  Neurological:  Positive for weakness.  All other systems reviewed and are negative.       Objective:   Physical Exam Vitals and nursing note reviewed.  Constitutional:      Appearance: Normal appearance.  Cardiovascular:     Rate and Rhythm: Normal rate and regular rhythm.  Pulmonary:     Effort: Pulmonary effort is normal.     Breath sounds: Normal breath sounds.  Musculoskeletal:     Right lower leg: Edema present.     Left lower leg: Edema present.     Comments: Normal Muscle Bulk and Muscle Testing Reveals:  Upper Extremities:Full  ROM and Muscle Strength 5/5 Lumbar Paraspinal Tenderness: L-4-L-5 Left Greater Trochanter Tenderness Lower Extremities: Full ROM and Muscle Strength 5/5 Arises from Table with ease Narrow Based  Gait     Skin:    General: Skin is warm and dry.  Neurological:     Mental Status: She is alert and oriented to person, place, and time.  Psychiatric:        Mood and Affect: Mood normal.        Behavior: Behavior normal.          Assessment & Plan:  1. History of vestibular schwannoma resulting in trigeminal neuralgia and left facial nerve injury./ Dizziness: Continue Tapentadol . Continue to monitor  08/16/2023. Refilled: Methadone  10 mg daily #30, second script sent for the following month and Nucunta 50 mg one tablet every 12 hours as needed #30.  We will continue the opioid monitoring program, this consists of regular clinic visits, examinations, urine drug screen, pill counts as well as use of [place]  Controlled Substance Reporting system. A 12 month History has been reviewed on the [place]  Controlled Substance Reporting System on 08/16/2023.  2. History of cervicalgia/ Cervical Radiculitis: No complaints today. Continue current medication  regimen. Continue HEP and Continue to Monitor. 08/16/2023 3. Chronic pain related to CN 5 involvement : Continue  Voltaren  Gel and current medication regimen. Continue to monitor. 08/16/2023  4. Lumbar Radiculitis: No complaints today. Continue current medication Regimen. Continue Nucynta . 08/16/2023 5. Greater Trochanter Bursitis of Left Hip: Alternate with Ice and Heat Therapy. Continue to Monitor. 07/012025   F/U in 2 months

## 2023-08-19 LAB — DRUG TOX MONITOR 1 W/CONF, ORAL FLD
Amphetamines: NEGATIVE ng/mL (ref <10)
Barbiturates: NEGATIVE ng/mL (ref <10)
Benzodiazepines: NEGATIVE ng/mL (ref <0.50)
Buprenorphine: NEGATIVE ng/mL (ref <0.10)
Cocaine: NEGATIVE ng/mL (ref <5.0)
EDDP: NEGATIVE ng/mL (ref <5.0)
Fentanyl: NEGATIVE ng/mL (ref <0.10)
Heroin Metabolite: NEGATIVE ng/mL (ref <1.0)
MARIJUANA: NEGATIVE ng/mL (ref <2.5)
MDMA: NEGATIVE ng/mL (ref <10)
Meprobamate: NEGATIVE ng/mL (ref <2.5)
Methadone: 179.1 ng/mL — ABNORMAL HIGH (ref <5.0)
Methadone: POSITIVE ng/mL — AB (ref <5.0)
Nicotine Metabolite: NEGATIVE ng/mL (ref <5.0)
Opiates: NEGATIVE ng/mL (ref <2.5)
Phencyclidine: NEGATIVE ng/mL (ref <10)
Tapentadol: NEGATIVE ng/mL (ref <5.0)
Tramadol: NEGATIVE ng/mL (ref <5.0)
Zolpidem: NEGATIVE ng/mL (ref <5.0)

## 2023-08-19 LAB — DRUG TOX ALC METAB W/CON, ORAL FLD: Alcohol Metabolite: NEGATIVE ng/mL (ref <25)

## 2023-10-11 ENCOUNTER — Encounter: Attending: Physical Medicine & Rehabilitation | Admitting: Registered Nurse

## 2023-10-11 VITALS — BP 138/62 | HR 80 | Ht 63.0 in | Wt 172.0 lb

## 2023-10-11 DIAGNOSIS — Z79891 Long term (current) use of opiate analgesic: Secondary | ICD-10-CM | POA: Diagnosis present

## 2023-10-11 DIAGNOSIS — D333 Benign neoplasm of cranial nerves: Secondary | ICD-10-CM | POA: Diagnosis present

## 2023-10-11 DIAGNOSIS — G500 Trigeminal neuralgia: Secondary | ICD-10-CM

## 2023-10-11 DIAGNOSIS — G894 Chronic pain syndrome: Secondary | ICD-10-CM | POA: Diagnosis present

## 2023-10-11 DIAGNOSIS — M47816 Spondylosis without myelopathy or radiculopathy, lumbar region: Secondary | ICD-10-CM | POA: Diagnosis present

## 2023-10-11 DIAGNOSIS — Z5181 Encounter for therapeutic drug level monitoring: Secondary | ICD-10-CM | POA: Diagnosis present

## 2023-10-11 MED ORDER — TAPENTADOL HCL 50 MG PO TABS: 50.0000 mg | ORAL_TABLET | Freq: Two times a day (BID) | ORAL | 0 refills | Status: DC | PRN

## 2023-10-11 MED ORDER — METHADONE HCL 10 MG PO TABS: 10.0000 mg | ORAL_TABLET | Freq: Every day | ORAL | 0 refills | Status: DC

## 2023-10-11 NOTE — Progress Notes (Signed)
 Subjective:    Patient ID: [name], female    DOB: Oct 21, 1945, 78 y.o.   MRN: [id number]  HPI: [name] is a 78 y.o. female who returns for follow up appointment for chronic pain and medication refill. She reports left facial pain. She  rates her pain 3. Her current exercise regime is walking and performing stretching exercises.  [name] Morphine equivalent is 87.00 MME.   Last Oral Swab was Performed on 08/16/2023, it was consistent for Methadone , she uses her Nucynta  sparingly.     Pain Inventory Average Pain 7 Pain Right Now 3 My pain is constant, sharp, burning, stabbing, and tingling  In the last 24 hours, has pain interfered with the following? General activity 8 Relation with others 9 Enjoyment of life 9 What TIME of day is your pain at its worst? morning  and daytime Sleep (in general) Good  Pain is worse with: talking Pain improves with: medication Relief from Meds: 8  Family History  Problem Relation Age of Onset   Hypertension Mother    Transient ischemic attack Mother    Coronary artery disease Father        CHF   Coronary artery disease Sister    Macular degeneration Sister    Transient ischemic attack Maternal Grandmother    Diabetes Neg Hx    Cancer Neg Hx    Social History   Socioeconomic History   Marital status: Married    Spouse name: Not on file   Number of children: 1   Years of education: Not on file   Highest education level: Not on file  Occupational History   Occupation: Retired  Tobacco Use   Smoking status: Never   Smokeless tobacco: Never  Vaping Use   Vaping status: Never Used  Substance and Sexual Activity   Alcohol  use: No    Alcohol /week: 0.0 standard drinks of alcohol    Drug use: No   Sexual activity: Not on file  Other Topics Concern   Not on file  Social History Narrative   Not on file   Social Drivers of Health   Financial Resource Strain: Not on file  Food Insecurity: Not on file   Transportation Needs: Not on file  Physical Activity: Not on file  Stress: Not on file  Social Connections: Not on file   Past Surgical History:  Procedure Laterality Date   ABDOMINAL HYSTERECTOMY     CARDIAC SURGERY  1955   closed hole in heart   CATARACT EXTRACTION Left 02/02/2016   LUMBAR DISC SURGERY     Dr Deward   no colonoscopy     scared; [facility] reviewed   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; 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   Past Surgical History:  Procedure Laterality Date   ABDOMINAL HYSTERECTOMY     CARDIAC SURGERY  1955   closed hole in heart   CATARACT EXTRACTION Left 02/02/2016   LUMBAR DISC SURGERY     Dr Deward   no colonoscopy     scared; [facility] reviewed   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  Opioid Risk Score:   Fall Risk Score:  `1  Depression screen [facility] 2/9     08/16/2023   10:19 AM 06/16/2023    9:47 AM 08/16/2022    9:24 AM 06/16/2022    9:34 AM 04/20/2022    9:19 AM 02/03/2022    9:10 AM 10/14/2021    9:02 AM  Depression screen PHQ 2/9  Decreased Interest 0 0 0 0 0 0 0  Down, Depressed, Hopeless 0 0 0 0 0 0 0  PHQ - 2 Score 0 0 0 0 0 0 0  Altered sleeping 0        Tired, decreased energy 0        Change in appetite 0        Feeling bad or failure about yourself  0        Trouble concentrating 0        Moving slowly or fidgety/restless 0        Suicidal thoughts 0        PHQ-9 Score 0           Review of Systems  Musculoskeletal:        Left side of month pain  All other  systems reviewed and are negative.      Objective:   Physical Exam Vitals and nursing note reviewed.  Constitutional:      Appearance: Normal appearance.  Cardiovascular:     Rate and Rhythm: Normal rate and regular rhythm.     Pulses: Normal pulses.     Heart sounds: Normal heart sounds.  Pulmonary:     Effort: Pulmonary effort is normal.     Breath sounds: Normal breath sounds.  Musculoskeletal:     Comments: Normal Muscle Bulk and Muscle Testing Reveals:  Upper Extremities: Full ROM and Muscle Strength 5/5 Thoracic Paraspinal Tenderness: T-1-T-2 Lumbar Paraspinal Tenderness: L-4-L-5 Lower Extremities: Full ROM and Muscle Strength 5/5 Arises from Table with ease Narrow Based  Gait     Neurological:     Mental Status: She is alert and oriented to person, place, and time.  Psychiatric:        Mood and Affect: Mood normal.        Behavior: Behavior normal.          Assessment & Plan:  1. History of vestibular schwannoma resulting in trigeminal neuralgia and left facial nerve injury./ Dizziness: Continue Tapentadol . Continue to monitor  10/11/2023. Refilled: Methadone  10 mg daily #30, second script sent for the following month and Nucunta 50 mg one tablet every 12 hours as needed #30.  We will continue the opioid monitoring program, this consists of regular clinic visits, examinations, urine drug screen, pill counts as well as use of [place]  Controlled Substance Reporting system. A 12 month History has been reviewed on the [place]  Controlled Substance Reporting System on 10/11/2023.  2. History of cervicalgia/ Cervical Radiculitis: No complaints today. Continue current medication regimen. Continue HEP and Continue to Monitor. 10/11/2023 3. 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 Continue to monitor. 10/11/2023  4. Lumbar Radiculitis: No complaints today. Continue current medication Regimen. Continue  Nucynta . 10/11/2023 5. Greater  Trochanter Bursitis of Left Hip: Alternate with Ice and Heat Therapy. Continue to Monitor. 08/262025   F/U in 2 months

## 2023-10-16 ENCOUNTER — Encounter: Payer: Self-pay | Admitting: Registered Nurse

## 2023-12-12 ENCOUNTER — Encounter (INDEPENDENT_AMBULATORY_CARE_PROVIDER_SITE_OTHER): Payer: Medicare Other | Admitting: Ophthalmology

## 2023-12-12 DIAGNOSIS — H353132 Nonexudative age-related macular degeneration, bilateral, intermediate dry stage: Secondary | ICD-10-CM

## 2023-12-12 DIAGNOSIS — H35033 Hypertensive retinopathy, bilateral: Secondary | ICD-10-CM | POA: Diagnosis not present

## 2023-12-12 DIAGNOSIS — I10 Essential (primary) hypertension: Secondary | ICD-10-CM | POA: Diagnosis not present

## 2023-12-12 DIAGNOSIS — H43813 Vitreous degeneration, bilateral: Secondary | ICD-10-CM | POA: Diagnosis not present

## 2023-12-13 ENCOUNTER — Encounter: Payer: Self-pay | Admitting: Registered Nurse

## 2023-12-13 ENCOUNTER — Encounter: Attending: Physical Medicine & Rehabilitation | Admitting: Registered Nurse

## 2023-12-13 VITALS — BP 148/77 | HR 63 | Ht 63.0 in | Wt 174.0 lb

## 2023-12-13 DIAGNOSIS — D333 Benign neoplasm of cranial nerves: Secondary | ICD-10-CM | POA: Insufficient documentation

## 2023-12-13 DIAGNOSIS — G894 Chronic pain syndrome: Secondary | ICD-10-CM | POA: Diagnosis not present

## 2023-12-13 DIAGNOSIS — G500 Trigeminal neuralgia: Secondary | ICD-10-CM | POA: Diagnosis present

## 2023-12-13 DIAGNOSIS — M5416 Radiculopathy, lumbar region: Secondary | ICD-10-CM | POA: Insufficient documentation

## 2023-12-13 DIAGNOSIS — Z5181 Encounter for therapeutic drug level monitoring: Secondary | ICD-10-CM | POA: Insufficient documentation

## 2023-12-13 DIAGNOSIS — M47816 Spondylosis without myelopathy or radiculopathy, lumbar region: Secondary | ICD-10-CM | POA: Diagnosis present

## 2023-12-13 DIAGNOSIS — Z79891 Long term (current) use of opiate analgesic: Secondary | ICD-10-CM | POA: Insufficient documentation

## 2023-12-13 MED ORDER — METHADONE HCL 10 MG PO TABS: 10.0000 mg | ORAL_TABLET | Freq: Every day | ORAL | 0 refills | Status: DC

## 2023-12-13 NOTE — Progress Notes (Signed)
 Subjective:    Patient ID: [name], female    DOB: 11/11/1945, 78 y.o.   MRN: [id number]  HPI: [name] is a 78 y.o. female who returns for follow up appointment for chronic pain and medication refill. She reports left facial pain and lower back radiating into her left lower extremity. She rates her pain 4. Her current exercise regime is walking and performing stretching exercises.  [name] Morphine equivalent is 87.00 MME.   Last Oral Swab was Performed on 08/16/2023, it was consistent.      Pain Inventory Average Pain 7 Pain Right Now 4 My pain is constant, sharp, burning, tingling, and aching  In the last 24 hours, has pain interfered with the following? General activity 7 Relation with others 8 Enjoyment of life 8 What TIME of day is your pain at its worst? daytime and evening Sleep (in general) Good  Pain is worse with: talking Pain improves with: ice & medication Relief from Meds:   Family History  Problem Relation Age of Onset   Hypertension Mother    Transient ischemic attack Mother    Coronary artery disease Father        CHF   Coronary artery disease Sister    Macular degeneration Sister    Transient ischemic attack Maternal Grandmother    Diabetes Neg Hx    Cancer Neg Hx    Social History   Socioeconomic History   Marital status: Married    Spouse name: Not on file   Number of children: 1   Years of education: Not on file   Highest education level: Not on file  Occupational History   Occupation: Retired  Tobacco Use   Smoking status: Never   Smokeless tobacco: Never  Vaping Use   Vaping status: Never Used  Substance and Sexual Activity   Alcohol  use: No    Alcohol /week: 0.0 standard drinks of alcohol    Drug use: No   Sexual activity: Not on file  Other Topics Concern   Not on file  Social History Narrative   Not on file   Social Drivers of Health   Financial Resource Strain: Not on file  Food Insecurity: Not on  file  Transportation Needs: Not on file  Physical Activity: Not on file  Stress: Not on file  Social Connections: Not on file   Past Surgical History:  Procedure Laterality Date   ABDOMINAL HYSTERECTOMY     CARDIAC SURGERY  1955   closed hole in heart   CATARACT EXTRACTION Left 02/02/2016   LUMBAR DISC SURGERY     Dr Deward   no colonoscopy     scared; [facility] reviewed   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; 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   Past Surgical History:  Procedure Laterality Date   ABDOMINAL HYSTERECTOMY     CARDIAC SURGERY  1955   closed hole in heart   CATARACT EXTRACTION Left 02/02/2016   LUMBAR DISC SURGERY     Dr Deward   no colonoscopy     scared; [facility] reviewed   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  Opioid Risk Score:   Fall Risk Score:  `1  Depression screen [facility] 2/9     08/16/2023   10:19 AM 06/16/2023    9:47 AM 08/16/2022    9:24 AM 06/16/2022    9:34 AM 04/20/2022    9:19 AM 02/03/2022    9:10 AM 10/14/2021    9:02 AM  Depression screen PHQ 2/9  Decreased Interest 0 0 0 0 0 0 0  Down, Depressed, Hopeless 0 0 0 0 0 0 0  PHQ - 2 Score 0 0 0 0 0 0 0  Altered sleeping 0        Tired, decreased energy 0        Change in appetite 0        Feeling bad or failure about yourself  0        Trouble concentrating 0        Moving slowly or fidgety/restless 0        Suicidal thoughts 0        PHQ-9 Score 0          Review of Systems  HENT:         Pain on the left side of the face  All  other systems reviewed and are negative.      Objective:   Physical Exam Vitals and nursing note reviewed.  Constitutional:      Appearance: Normal appearance.  Cardiovascular:     Rate and Rhythm: Normal rate and regular rhythm.     Pulses: Normal pulses.     Heart sounds: Normal heart sounds.  Pulmonary:     Effort: Pulmonary effort is normal.     Breath sounds: Normal breath sounds.  Musculoskeletal:     Comments: Normal Muscle Bulk and Muscle Testing Reveals:  Upper Extremities:Full  ROM and Muscle Strength 5/5 Lumbar Paraspinal Tenderness: L-4-L-5 Lower Extremities: Full ROM and Muscle Strength 5/5 Arises from Table slowly Narrow Based  Gait     Skin:    General: Skin is warm and dry.  Neurological:     Mental Status: She is alert and oriented to person, place, and time.  Psychiatric:        Mood and Affect: Mood normal.        Behavior: Behavior normal.          Assessment & Plan:  1. History of vestibular schwannoma resulting in trigeminal neuralgia and left facial nerve injury./ Dizziness: Continue Tapentadol . Continue to monitor  12/13/2023. Refilled: Methadone  10 mg daily #30, second script sent for the following month and Nucunta 50 mg one tablet every 12 hours as needed #30.  We will continue the opioid monitoring program, this consists of regular clinic visits, examinations, urine drug screen, pill counts as well as use of [place]  Controlled Substance Reporting system. A 12 month History has been reviewed on the [place]  Controlled Substance Reporting System on 12/13/2023.  2. History of cervicalgia/ Cervical Radiculitis: No complaints today. Continue current medication regimen. Continue HEP and Continue to Monitor. 12/13/2023 3. 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 Continue to monitor. 12/13/2023  4. Lumbar Radiculitis: Continue current  medication Regimen. Continue Nucynta . 12/13/2023 5.  Greater Trochanter Bursitis of Left Hip: Alternate with Ice and Heat Therapy. Continue to Monitor. 10/282025   F/U in 2 months

## 2024-02-13 ENCOUNTER — Encounter: Payer: Self-pay | Admitting: Registered Nurse

## 2024-02-13 ENCOUNTER — Encounter: Attending: Physical Medicine & Rehabilitation | Admitting: Registered Nurse

## 2024-02-13 VITALS — BP 147/89 | HR 72 | Ht 63.0 in | Wt 175.6 lb

## 2024-02-13 DIAGNOSIS — G894 Chronic pain syndrome: Secondary | ICD-10-CM | POA: Diagnosis present

## 2024-02-13 DIAGNOSIS — G500 Trigeminal neuralgia: Secondary | ICD-10-CM | POA: Insufficient documentation

## 2024-02-13 DIAGNOSIS — M47816 Spondylosis without myelopathy or radiculopathy, lumbar region: Secondary | ICD-10-CM | POA: Insufficient documentation

## 2024-02-13 DIAGNOSIS — Z5181 Encounter for therapeutic drug level monitoring: Secondary | ICD-10-CM | POA: Diagnosis present

## 2024-02-13 DIAGNOSIS — Z79891 Long term (current) use of opiate analgesic: Secondary | ICD-10-CM | POA: Insufficient documentation

## 2024-02-13 MED ORDER — TAPENTADOL HCL 50 MG PO TABS: 50.0000 mg | ORAL_TABLET | Freq: Two times a day (BID) | ORAL | 0 refills | Status: DC | PRN

## 2024-02-13 MED ORDER — METHADONE HCL 10 MG PO TABS: 10.0000 mg | ORAL_TABLET | Freq: Every day | ORAL | 0 refills | Status: AC

## 2024-02-13 MED ORDER — TAPENTADOL HCL 50 MG PO TABS: 50.0000 mg | ORAL_TABLET | Freq: Two times a day (BID) | ORAL | 0 refills | Status: AC | PRN

## 2024-02-13 MED ORDER — METHADONE HCL 10 MG PO TABS: 10.0000 mg | ORAL_TABLET | Freq: Every day | ORAL | 0 refills | Status: DC

## 2024-02-13 NOTE — Progress Notes (Signed)
 "  Subjective:    Patient ID: [name], female    DOB: [birth year], 78 y.o.   MRN: [id number]  HPI: [name] is a 78 y.o. female who returns for follow up appointment for chronic pain and medication refill. She states she has left facial pain and lower back pain. She rates her pain 5. Her current exercise regime is walking and performing stretching exercises.  [name] Morphine equivalent is 87.00 MME.   Oral Swab was Performed today.   Pain Inventory Average Pain 8 Pain Right Now 5 My pain is sharp, burning, and stabbing  In the last 24 hours, has pain interfered with the following? General activity 9 Relation with others 8 Enjoyment of life 9 What TIME of day is your pain at its worst? daytime Sleep (in general) Good  Pain is worse with: talking Pain improves with: medication Relief from Meds: 4  Family History  Problem Relation Age of Onset   Hypertension Mother    Transient ischemic attack Mother    Coronary artery disease Father        CHF   Coronary artery disease Sister    Macular degeneration Sister    Transient ischemic attack Maternal Grandmother    Diabetes Neg Hx    Cancer Neg Hx    Social History   Socioeconomic History   Marital status: Married    Spouse name: Not on file   Number of children: 1   Years of education: Not on file   Highest education level: Not on file  Occupational History   Occupation: Retired  Tobacco Use   Smoking status: Never   Smokeless tobacco: Never  Vaping Use   Vaping status: Never Used  Substance and Sexual Activity   Alcohol  use: No    Alcohol /week: 0.0 standard drinks of alcohol    Drug use: No   Sexual activity: Not on file  Other Topics Concern   Not on file  Social History Narrative   Not on file   Social Drivers of Health   Tobacco Use: Low Risk (02/13/2024)   Patient History    Smoking Tobacco Use: Never    Smokeless Tobacco Use: Never    Passive Exposure: Not on file  Financial  Resource Strain: Not on file  Food Insecurity: Not on file  Transportation Needs: Not on file  Physical Activity: Not on file  Stress: Not on file  Social Connections: Not on file  Depression (PHQ2-9): Low Risk (02/13/2024)   Depression (PHQ2-9)    PHQ-2 Score: 0  Alcohol  Screen: Not on file  Housing: Not on file  Utilities: Not on file  Health Literacy: Not on file   Past Surgical History:  Procedure Laterality Date   ABDOMINAL HYSTERECTOMY     CARDIAC SURGERY  1955   closed hole in heart   CATARACT EXTRACTION Left 02/02/2016   LUMBAR DISC SURGERY     Dr Deward   no colonoscopy     scared; [facility] reviewed   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; 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   Past Surgical History:  Procedure Laterality Date   ABDOMINAL HYSTERECTOMY     CARDIAC SURGERY  1955   closed hole in heart   CATARACT EXTRACTION Left 02/02/2016   LUMBAR DISC SURGERY     Dr Deward   no colonoscopy     scared; [facility] reviewed   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;  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   Past Medical History:  Diagnosis Date   Abnormality of gait    Brachial neuritis or radiculitis NOS    Cervicalgia    Facial nerve disorder    Hearing loss    Left ear   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   Hyperlipidemia    Hypertension    Macular degeneration    Myofascial pain    Pneumonia 2005   Thyroid  disease    hyprothyroidism   Trigeminal neuralgia    BP (!) 147/89   Pulse 72   Ht 5' 3 (1.6 m)   Wt 175 lb 9.6 oz (79.7 kg)   SpO2 94%   BMI 31.11 kg/m   Opioid Risk Score:   Fall Risk Score:  `1  Depression screen [facility] 2/9     02/13/2024    9:25 AM 12/13/2023    9:02 AM 08/16/2023   10:19 AM 06/16/2023    9:47 AM 08/16/2022    9:24 AM 06/16/2022    9:34 AM 04/20/2022    9:19 AM  Depression screen PHQ 2/9  Decreased Interest 0 0 0 0 0 0 0  Down, Depressed, Hopeless 0 0 0 0 0 0 0  PHQ - 2 Score 0 0 0 0 0 0 0  Altered sleeping   0       Tired, decreased energy   0      Change in appetite   0      Feeling bad or failure about yourself    0      Trouble concentrating   0      Moving slowly or fidgety/restless   0      Suicidal thoughts   0      PHQ-9 Score   0          Data saved with a previous flowsheet row definition     Review of Systems  HENT:  Positive for hearing loss.        Face pain  All other systems reviewed and are negative.      Objective:   Physical Exam Vitals and nursing note reviewed.  Constitutional:      Appearance: Normal appearance.  Cardiovascular:     Rate and Rhythm: Normal rate and regular rhythm.     Pulses: Normal pulses.     Heart sounds: Normal heart sounds.  Pulmonary:     Effort: Pulmonary effort is normal.     Breath sounds: Normal breath sounds.  Musculoskeletal:     Comments: Normal Muscle Bulk and Muscle Testing Reveals:  Upper Extremities: Full ROM and Muscle Strength 5/5 Lumbar Paraspinal Tenderness: L-4-L-5 Lower Extremities: Full ROM and Muscle Strength 5/5 Arises from Table with ease Narrow Based  Gait     Skin:    General: Skin is warm and dry.  Neurological:     Mental Status: She is alert and oriented to person, place, and time.  Psychiatric:        Mood and Affect: Mood normal.        Behavior: Behavior normal.          Assessment & Plan:  1. History of vestibular schwannoma resulting in trigeminal neuralgia and left facial nerve injury./ Dizziness: Continue Tapentadol . Continue to monitor  02/13/2024. Refilled: Methadone  10 mg daily #30, and Nucunta 50 mg one tablet every 12 hours as needed #30. Second script sent for the following month.  We  will continue the opioid monitoring program, this consists of regular clinic visits, examinations, urine drug screen, pill counts as well as use of [place]  Controlled Substance Reporting system. A 12 month History has been reviewed on the [place]  Controlled Substance Reporting System on 02/13/2024.   2. History of cervicalgia/ Cervical Radiculitis: No complaints today. Continue current medication regimen. Continue HEP and Continue to Monitor. 02/13/2024 3. 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 Continue to monitor. 02/13/2024  4. Lumbar Radiculitis: Continue current medication Regimen. Continue Nucynta . 02/13/2024 5. Greater Trochanter Bursitis of Left Hip: No complaints today. Alternate with Ice and Heat Therapy. Continue to Monitor. 12/292025   F/U in 2 months   ] "

## 2024-02-16 LAB — DRUG TOX MONITOR 1 W/CONF, ORAL FLD
Amphetamines: NEGATIVE ng/mL
Barbiturates: NEGATIVE ng/mL
Benzodiazepines: NEGATIVE ng/mL
Buprenorphine: NEGATIVE ng/mL
Cocaine: NEGATIVE ng/mL
EDDP: NEGATIVE ng/mL
Fentanyl: NEGATIVE ng/mL
Heroin Metabolite: NEGATIVE ng/mL
MARIJUANA: NEGATIVE ng/mL
MDMA: NEGATIVE ng/mL
Meprobamate: NEGATIVE ng/mL
Methadone: 122.5 ng/mL — ABNORMAL HIGH
Methadone: POSITIVE ng/mL — AB
Nicotine Metabolite: NEGATIVE ng/mL
Opiates: NEGATIVE ng/mL
Phencyclidine: NEGATIVE ng/mL
Tapentadol: 28.1 ng/mL — ABNORMAL HIGH
Tapentadol: POSITIVE ng/mL — AB
Tramadol: NEGATIVE ng/mL
Zolpidem: NEGATIVE ng/mL

## 2024-02-16 LAB — DRUG TOX ALC METAB W/CON, ORAL FLD: Alcohol Metabolite: NEGATIVE ng/mL

## 2024-02-24 ENCOUNTER — Other Ambulatory Visit (HOSPITAL_COMMUNITY): Payer: Self-pay | Admitting: Nurse Practitioner

## 2024-02-24 DIAGNOSIS — M79604 Pain in right leg: Secondary | ICD-10-CM

## 2024-02-28 ENCOUNTER — Ambulatory Visit (HOSPITAL_COMMUNITY)
Admission: RE | Admit: 2024-02-28 | Discharge: 2024-02-28 | Disposition: A | Discharge status: Home / Self Care | Source: Ambulatory Visit | Attending: Vascular Surgery | Admitting: Vascular Surgery

## 2024-02-28 DIAGNOSIS — M79604 Pain in right leg: Secondary | ICD-10-CM | POA: Insufficient documentation

## 2024-02-28 DIAGNOSIS — M79605 Pain in left leg: Secondary | ICD-10-CM | POA: Insufficient documentation

## 2024-02-28 LAB — VAS US ABI WITH/WO TBI
Left ABI: 0.96
Right ABI: 0.97

## 2024-04-10 ENCOUNTER — Encounter: Admitting: Registered Nurse

## 2024-04-11 ENCOUNTER — Encounter: Admitting: Physical Medicine & Rehabilitation

## 2024-06-06 ENCOUNTER — Encounter: Admitting: Physical Medicine & Rehabilitation

## 2024-12-11 ENCOUNTER — Encounter (INDEPENDENT_AMBULATORY_CARE_PROVIDER_SITE_OTHER): Admitting: Ophthalmology
